# Patient Record
Sex: Female | Born: 1935 | Race: Black or African American | Hispanic: No | Smoking: Current every day smoker
Health system: Southern US, Community
[De-identification: ages and names within clinical notes are randomized; demographics above are authoritative.]

## PROBLEM LIST (undated history)

## (undated) DIAGNOSIS — L089 Local infection of the skin and subcutaneous tissue, unspecified: Secondary | ICD-10-CM

## (undated) DIAGNOSIS — J984 Other disorders of lung: Secondary | ICD-10-CM

## (undated) DIAGNOSIS — E7800 Pure hypercholesterolemia, unspecified: Secondary | ICD-10-CM

## (undated) DIAGNOSIS — I5022 Chronic systolic (congestive) heart failure: Secondary | ICD-10-CM

## (undated) DIAGNOSIS — I519 Heart disease, unspecified: Secondary | ICD-10-CM

## (undated) DIAGNOSIS — J449 Chronic obstructive pulmonary disease, unspecified: Secondary | ICD-10-CM

## (undated) DIAGNOSIS — I639 Cerebral infarction, unspecified: Secondary | ICD-10-CM

## (undated) DIAGNOSIS — R7611 Nonspecific reaction to tuberculin skin test without active tuberculosis: Secondary | ICD-10-CM

## (undated) DIAGNOSIS — F0390 Unspecified dementia without behavioral disturbance: Secondary | ICD-10-CM

## (undated) DIAGNOSIS — B029 Zoster without complications: Secondary | ICD-10-CM

## (undated) DIAGNOSIS — L723 Sebaceous cyst: Secondary | ICD-10-CM

## (undated) DIAGNOSIS — I2510 Atherosclerotic heart disease of native coronary artery without angina pectoris: Secondary | ICD-10-CM

---

## 2008-03-19 ENCOUNTER — Encounter: Payer: Self-pay | Admitting: Internal Medicine

## 2008-03-19 ENCOUNTER — Encounter: Admission: RE | Admit: 2008-03-19 | Discharge: 2008-03-19 | Payer: Self-pay | Admitting: Cardiology

## 2008-03-23 ENCOUNTER — Ambulatory Visit (HOSPITAL_COMMUNITY): Admission: RE | Admit: 2008-03-23 | Discharge: 2008-03-23 | Payer: Self-pay | Admitting: Cardiology

## 2008-03-23 ENCOUNTER — Encounter: Payer: Self-pay | Admitting: Internal Medicine

## 2008-03-30 ENCOUNTER — Ambulatory Visit: Payer: Self-pay | Admitting: Internal Medicine

## 2008-03-30 DIAGNOSIS — J449 Chronic obstructive pulmonary disease, unspecified: Secondary | ICD-10-CM

## 2008-03-30 DIAGNOSIS — I499 Cardiac arrhythmia, unspecified: Secondary | ICD-10-CM | POA: Insufficient documentation

## 2008-03-30 DIAGNOSIS — Z8659 Personal history of other mental and behavioral disorders: Secondary | ICD-10-CM | POA: Insufficient documentation

## 2008-03-30 DIAGNOSIS — J984 Other disorders of lung: Secondary | ICD-10-CM

## 2008-03-30 DIAGNOSIS — I219 Acute myocardial infarction, unspecified: Secondary | ICD-10-CM | POA: Insufficient documentation

## 2008-03-30 DIAGNOSIS — J4489 Other specified chronic obstructive pulmonary disease: Secondary | ICD-10-CM | POA: Insufficient documentation

## 2008-03-30 HISTORY — DX: Other disorders of lung: J98.4

## 2008-03-30 HISTORY — DX: Chronic obstructive pulmonary disease, unspecified: J44.9

## 2008-04-04 ENCOUNTER — Telehealth: Payer: Self-pay | Admitting: Internal Medicine

## 2008-04-10 ENCOUNTER — Ambulatory Visit (HOSPITAL_COMMUNITY): Admission: RE | Admit: 2008-04-10 | Discharge: 2008-04-10 | Payer: Self-pay | Admitting: Internal Medicine

## 2008-04-20 DIAGNOSIS — I6789 Other cerebrovascular disease: Secondary | ICD-10-CM

## 2008-04-20 DIAGNOSIS — I2510 Atherosclerotic heart disease of native coronary artery without angina pectoris: Secondary | ICD-10-CM | POA: Insufficient documentation

## 2008-04-20 DIAGNOSIS — E785 Hyperlipidemia, unspecified: Secondary | ICD-10-CM | POA: Insufficient documentation

## 2008-08-11 ENCOUNTER — Emergency Department (HOSPITAL_COMMUNITY): Admission: EM | Admit: 2008-08-11 | Discharge: 2008-08-11 | Payer: Self-pay | Admitting: Emergency Medicine

## 2009-02-25 ENCOUNTER — Ambulatory Visit: Payer: Self-pay | Admitting: Internal Medicine

## 2009-02-25 ENCOUNTER — Telehealth (INDEPENDENT_AMBULATORY_CARE_PROVIDER_SITE_OTHER): Payer: Self-pay | Admitting: *Deleted

## 2009-02-26 ENCOUNTER — Telehealth (INDEPENDENT_AMBULATORY_CARE_PROVIDER_SITE_OTHER): Payer: Self-pay | Admitting: *Deleted

## 2009-03-14 ENCOUNTER — Ambulatory Visit: Payer: Self-pay | Admitting: Internal Medicine

## 2009-06-14 ENCOUNTER — Telehealth: Payer: Self-pay | Admitting: Internal Medicine

## 2009-06-27 ENCOUNTER — Ambulatory Visit: Payer: Self-pay | Admitting: Internal Medicine

## 2009-06-27 DIAGNOSIS — N63 Unspecified lump in unspecified breast: Secondary | ICD-10-CM | POA: Insufficient documentation

## 2009-07-03 DIAGNOSIS — Z720 Tobacco use: Secondary | ICD-10-CM

## 2009-07-10 ENCOUNTER — Ambulatory Visit (HOSPITAL_COMMUNITY): Admission: RE | Admit: 2009-07-10 | Discharge: 2009-07-10 | Payer: Self-pay | Admitting: Internal Medicine

## 2009-07-10 ENCOUNTER — Telehealth (INDEPENDENT_AMBULATORY_CARE_PROVIDER_SITE_OTHER): Payer: Self-pay | Admitting: *Deleted

## 2009-07-16 ENCOUNTER — Ambulatory Visit: Payer: Self-pay | Admitting: Internal Medicine

## 2009-10-11 ENCOUNTER — Telehealth: Payer: Self-pay | Admitting: Internal Medicine

## 2009-11-29 ENCOUNTER — Telehealth (INDEPENDENT_AMBULATORY_CARE_PROVIDER_SITE_OTHER): Payer: Self-pay | Admitting: *Deleted

## 2009-12-23 ENCOUNTER — Ambulatory Visit: Payer: Self-pay | Admitting: Internal Medicine

## 2010-01-26 ENCOUNTER — Encounter: Payer: Self-pay | Admitting: Internal Medicine

## 2010-02-04 NOTE — Progress Notes (Signed)
Summary: nos appt  Phone Note Call from Patient   Caller: [email or web address]  Call For: young Summary of Call: [facility] nos from 6/9 to 6/23 @ 2:45p. Initial call taken by: Darletta Moll,  June 14, 2009 3:00 PM

## 2010-02-04 NOTE — Assessment & Plan Note (Signed)
Summary: rov/jd   Copy to:  Harwani Primary Provider/Referring Provider:  Sharyn Lull  CC:  Follow up visit-breathing good.Marland Kitchen  History of Present Illness: February 25, 2009--Presents for an acute office visit. Complains of cough occ producing white mucus that sounds "rattling", wheezing x67month. She is accompanied by caregiver --home health nurse that comes 5 days a week for 3 hrs daily. She says pt cough started 1 month ago and worsened for 1 week, now with thick muucs and wheezing.  She was seen last 03/2008, w/ PET scan that was positive for RUL nodule. She was recommended for follow up , unfortunately did not follow up . letter was sent to pt/family. Caregiver says she and Daughter are aware of this but unclear why pt did not follow up. Using cold meds without improvement w/ cough. NO increased confusion . No decline in oral intake. Denies chest pain, dyspnea, orthopnea, hemoptysis, fever, n/v/d, edema, headache.   March 14, 2009- Bronchitis, COPD, Lung nodule.....................daughter here. Finished prednisone and augmentin and feels back to normal now, We compared CXR from 02/25/09 with PET from 04/2008. There has been some slight progression of a hypermetabolic spiculated Right lung nodule. She and her daughter indicate clear understanding. We discussed options including needle biopsy or observation. The say clearly that they don't want anyting but observation. They do want to return for recheck in 3 moths.   June 27, 2009- Bronchitis, COPD, Lung nodule/ PET pos....................daughter here. Denies change in cough, phlegm, any blood, weight loss or pain. She doesn't want surgery,including the cataract surgery advised by her eye doctor. With daughter here, i was asked to examine chest wall- rib ends,  but also a ropey area 300 at left areola, no retraction or discharge. Daughter implies that mother not fuly able to make decisons and that daughter is HCPOA.  Preventive Screening-Counseling &  Management  Alcohol-Tobacco     Smoking Status: current-off and on.     Packs/Day: <0.25     Tobacco Counseling: to quit use of tobacco products  Current Medications (verified): 1)  Lipitor 10 Mg Tabs (Atorvastatin Calcium) .... Take 1 By Mouth Once Daily 2)  Zoloft 25 Mg Tabs (Sertraline Hcl) .... Take 1 By Mouth Once Daily 3)  Amlodipine Besylate 5 Mg Tabs (Amlodipine Besylate) .... Take 1 By Mouth Once Daily 4)  Metoprolol Succinate 25 Mg Xr24h-Tab (Metoprolol Succinate) .... Take 1 By Mouth Once Daily 5)  Namenda 5 Mg Tabs (Memantine Hcl) .... Take 1 By Mouth Once Daily 6)  Eldertonic  Elix (Multiple Vitamins-Minerals) .Marland Kitchen.. 1 Tablespoon Before Supper  Allergies (verified): No Known Drug Allergies  Past History:  Past Medical History: Last updated: 02/25/2009 C V A / Stroke Coronary Heart Disease Hyperlipidemia Myocardial Infarction psychiatric dx- ? schizophrenia- hosp 2010 'breakdown" Hx positive TB skin test- untreated  RUL nodule (CT chest)- positive PET 03/2008 - unable to reach, did not follow up, letter sent 2010.  --return ov- CXR February 25, 2009, showed slight increase in nodule.   Past Surgical History: Last updated: 03/30/2008 None  Family History: Last updated: 06/27/2009 Daughter and daughter in law both RX'd for TB  Social History: Last updated: 03/30/2008 Smoked for more than 30 years; quit in 2000; smoked 1 pack every 2 days.  widowed with children.  ives with granddaughter  Risk Factors: Smoking Status: current-off and on. (06/27/2009) Packs/Day: <0.25 (06/27/2009)  Family History: Daughter and daughter in Social worker both RX'd for TB  Social History: Smoking Status:  current-off and on. Packs/Day:  <0.25  Review of Systems      See HPI       The patient complains of shortness of breath with activity and non-productive cough.  The patient denies shortness of breath at rest, productive cough, coughing up blood, chest pain, irregular heartbeats,  acid heartburn, indigestion, loss of appetite, weight change, abdominal pain, difficulty swallowing, sore throat, tooth/dental problems, headaches, nasal congestion/difficulty breathing through nose, and sneezing.    Vital Signs:  Patient profile:   77 year old female Height:      62 inches Weight:      105 pounds BMI:     19.27 O2 Sat:      93 % on Room air Pulse rate:   75 / minute BP sitting:   124 / 78  (left arm) Cuff size:   regular  Vitals Entered By: Reynaldo Minium CMA (June 27, 2009 3:30 PM)  O2 Flow:  Room air CC: Follow up visit-breathing good.   Physical Exam  Additional Exam:  General: A/Ox person, ; pleasant and cooperative, NAD,calm, thin elderly woman SKIN: no rash, lesions NODES: no lymphadenopathy HEENT: [place]/AT, EOM- WNL, Conjuctivae- clear, PERRLA, TM-WNL, Nose- clear, Throat- clear and wnl, Mallampati II, some missing teeth NECK: Supple w/ fair ROM, JVD- none, normal carotid impulses w/o bruits Thyroid- normal to palpation CHEST;Few crackles, unlabored, ropey area lft breast lateral to areola at 3:00 HEART: RRR, no m/g/r heard ABDOMEN: Soft and nl;  JXB:JYNW, nl pulses, no edema  NEURO: no focal deficits noted.       Impression & Recommendations:  Problem # 1:  LUNG NODULE (ICD-518.89)  Daughter, HCPOA, insists that they want and will keep appointment for needle bx of lung nodule. Mother has alzheimer's. I discussed conservative management at age 34 and would anticipate early involvement of Hospice as an option.  Problem # 2:  BREAST MASS (ICD-611.72)  Question left breast mass. Agrees to [facility]  Problem # 3:  COPD (ICD-496) She is not active enough to really challenge her lung reserve. Does not seem to need bronchodilators.  Problem # 4:  TOBACCO USER (ICD-305.1) Although she doesn't smoke much, I did explain effects on her body, esp with hx cardiac disease. I pushed for her to stop and observed family should not buy her cigarettes.  Other  Orders: Est. Patient Level IV (29562) Radiology Referral (Radiology) Misc. Referral (Misc. Ref)  Patient Instructions: 1)  Please schedule a follow-up appointment in 1 month. 2)  See [facility] for referral for mamogram and for lung biopsy

## 2010-02-04 NOTE — Progress Notes (Signed)
Summary: rx  Phone Note Call from Patient   Caller: Patient Call For: Annette Cantrell Summary of Call: prescriptions from today's visit sent to wrong pharmacy.  Please c/x those and re-send to [facility] - [phone] Initial call taken by: Eugene Gavia,  February 25, 2009 2:03 PM  Follow-up for Phone Call        sorry, i added her new pharmacy today but EMR still defaults to the previous one.  will resend per TP. Boone Master CNA  February 25, 2009 2:26 PM   ATC to inform rx's resent to [facility] but line has been disconnected.  **NOTE home number was verified at [facility] today** will sign off on triage msg. Boone Master CNA  February 25, 2009 2:30 PM     Prescriptions: PREDNISONE 10 MG TABS (PREDNISONE) 4 tabs for 2 days, then 3 tabs for 2 days, 2 tabs for 2 days, then 1 tab for 2 days, then stop  #20 x 0   Entered by:   Boone Master CNA   Authorized by:   Rubye Oaks NP   Signed by:   Boone Master CNA on 02/25/2009   Method used:   Electronically to        [facility] [id number] (retail)       [address] [place], [place]  [address]       Ph: [phone]       Fax: [phone]   RxID:   [id number] AUGMENTIN 875-125 MG TABS (AMOXICILLIN-POT CLAVULANATE) 1 by mouth two times a day  #14 x 0   Entered by:   Boone Master CNA   Authorized by:   Rubye Oaks NP   Signed by:   Boone Master CNA on 02/25/2009   Method used:   Electronically to        [facility] [id number] (retail)       [address] [place], [place]  [address]       Ph: [phone]       Fax: [phone]   RxID:   [id number]

## 2010-02-04 NOTE — Progress Notes (Signed)
Summary: Patient declined needle biopsy  Phone Note From Other Clinic   Summary of Call: Radiology called. Patient there with daughter. Patient is now refusing any biopsy or needle procedure or surgery. So noted.  I will ask Florentina Addison to make her a f/u appointment to review. Initial call taken by: Waymon Budge MD,  July 10, 2009 10:52 AM  Follow-up for Phone Call        Attempted to call pt; line busy x 3. Reynaldo Minium CMA  July 10, 2009 5:04 PM    Spoke with pt's daughter; aware of July 12th 1115am appt with CD[facility] to review next steps for treatment.Reynaldo Minium CMA  July 12, 2009 3:12 PM

## 2010-02-04 NOTE — Progress Notes (Signed)
Summary: nos appt  Phone Note Call from Patient   Caller: [email or web address]  Call For: young Summary of Call: In ref to 10/6 nos, pt's daughter states she will call to rsc. Initial call taken by: Darletta Moll,  October 11, 2009 9:28 AM

## 2010-02-04 NOTE — Progress Notes (Signed)
Summary: needs [facility]s [facility]  Phone Note Call from Patient Call back at Home Phone [phone]   Caller: Britta Mccreedy (daughter) Call For: young Summary of Call: Britta Mccreedy the daughter was calling beause the medicine that was prescribed yesterday, was sent to the wrong pharmacy. then when she went to [facility] to pick the meds up, they told her that it could not be filled because it was to close together.  Initial call taken by: Valinda Hoar,  February 26, 2009 12:53 PM  Follow-up for Phone Call        i had Dot Lanes transfer the call to me.  Britta Mccreedy stated that she went to the [facility] and was told that "it was too soon for refills" and that her mother needs her medication.  i informed Britta Mccreedy that her mother's meds were indeed sent to [facility] yesterday afternoon but we were unable to contact them regarding this b/c the phone was incorrect.  i then asked her to give a correct contact number.  when i read off the one that i perosnally verified at the [facility], barbra confirmed that i was given the wrong number.  i told Britta Mccreedy that will call [facility] to find out why they would not fill pt's meds.  called spoke with pharmacist at [facility] and was told that the pred taper and augmentin were denied by the insurance b/c it had been sent to another pharmacy - which happened when EMR defaulted to pt's previous pharmacy.  i then called [facility] on [address] to make sure that the rx was ready for pick up, which was verified.  called spoke to [place] again, updated her on what i found out about the rx's.  told her that pharmacy that the scripts were at and she stated that she will go there to pick them up this afternoon.  i apologized for the inconveniences and misunderstandings and told her that this will not happen again.  Britta Mccreedy was happy with this outcome and verbalized her understanding. Follow-up by: Boone Master CNA,  February 26, 2009 2:32 PM

## 2010-02-04 NOTE — Assessment & Plan Note (Signed)
Summary: follow up/klw   Copy to:  Harwani Primary Provider/Referring Provider:  Sharyn Cantrell  CC:  Follow up visit.  History of Present Illness:  03/30/08-New consult for Dr Annette Cantrell- lung nodules.CXR 03/19/08- densities in RUL and AP window. CT w/o cm 3/19- 1.6 mm spiculated nodule RUL- high suspicion, soft tissue fulness R neck- Mass vs vascular. Other densities in RUL, lingula with bronchiectasis.She comes with daughter and granddaughter. They had just moved her from [facility] 01/15/08, with report of psychiatric inpatient care in 2010. She describes "heavy soreness" that comes and goes over the past month., lasting 1-2 hours, worse if using arms. Some dyspnea, mild dry cough without wheeze. Never told COPD or pneumonia. Denies routine cough. Occasional night sweats. Hx POS PPD, never treated. A daughter was Rx'd for TB.  February 25, 2009--Presents for an acute office visit. Complains of cough occ producing white mucus that sounds "rattling", wheezing x23month. She is accompanied by caregiver --home health nurse that comes 5 days a week for 3 hrs daily. She says pt cough started 1 month ago and worsened for 1 week, now with thick muucs and wheezing.  She was seen last 03/2008, w/ PET scan that was positive for RUL nodule. She was recommended for follow up , unfortunately did not follow up . letter was sent to pt/family. Caregiver says she and Daughter are aware of this but unclear why pt did not follow up. Using cold meds without improvement w/ cough. NO increased confusion . No decline in oral intake. Denies chest pain, dyspnea, orthopnea, hemoptysis, fever, n/v/d, edema, headache.   March 14, 2009- Bronchitis, COPD, Lung nodule.....................daughter here. Finished prednisone and augmentin and feels back to normal now, We compared CXR from 02/25/09 with PET from 04/2008. There has been some slight progression of a hypermetabolic spiculated Right lung nodule. She and her daughter indicate clear  understanding. We discussed options including needle biopsy or observation. The say clearly that they don't want anyting but observation. They do want to return for recheck in 3 moths.    Current Medications (verified): 1)  Lipitor 10 Mg Tabs (Atorvastatin Calcium) .... Take 1 By Mouth Once Daily 2)  Zoloft 25 Mg Tabs (Sertraline Hcl) .... Take 1 By Mouth Once Daily 3)  Amlodipine Besylate 5 Mg Tabs (Amlodipine Besylate) .... Take 1 By Mouth Once Daily 4)  Metoprolol Succinate 25 Mg Xr24h-Tab (Metoprolol Succinate) .... Take 1 By Mouth Once Daily 5)  Namenda 5 Mg Tabs (Memantine Hcl) .... Take 1 By Mouth Once Daily  Allergies (verified): No Known Drug Allergies  Past History:  Past Medical History: Last updated: 02/25/2009 C V A / Stroke Coronary Heart Disease Hyperlipidemia Myocardial Infarction psychiatric dx- ? schizophrenia- hosp 2010 'breakdown" Hx positive TB skin test- untreated  RUL nodule (CT chest)- positive PET 03/2008 - unable to reach, did not follow up, letter sent 2010.  --return ov- CXR February 25, 2009, showed slight increase in nodule.   Past Surgical History: Last updated: 03/30/2008 None  Family History: Last updated: 03/30/2008 Daughter and daughter in law goth RX'd for TB  Social History: Last updated: 03/30/2008 Smoked for more than 30 years; quit in 2000; smoked 1 pack every 2 days.  widowed with children.  ives with granddaughter  Risk Factors: Smoking Status: quit (02/25/2009)  Review of Systems      See HPI       The patient complains of dyspnea on exertion.  The patient denies anorexia, fever, weight loss, weight gain, vision loss, decreased hearing,  hoarseness, chest pain, syncope, peripheral edema, prolonged cough, headaches, hemoptysis, abdominal pain, and severe indigestion/heartburn.    Vital Signs:  Patient profile:   77 year old female Height:      62 inches Weight:      111.25 pounds O2 Sat:      94 % on Room air Pulse rate:    88 / minute BP sitting:   142 / 64  (left arm) Cuff size:   regular  Vitals Entered By: Reynaldo Minium CMA (March 14, 2009 3:24 PM)  O2 Flow:  Room air  Physical Exam  Additional Exam:  General: A/Ox person, ; pleasant and cooperative, NAD,calm, thin elderly woman SKIN: no rash, lesions NODES: no lymphadenopathy HEENT: [place]/AT, EOM- WNL, Conjuctivae- clear, PERRLA, TM-WNL, Nose- clear, Throat- clear and wnl, Mallampati II, some missing teeth NECK: Supple w/ fair ROM, JVD- none, normal carotid impulses w/o bruits Thyroid- normal to palpation CHEST;Few crackles, unlabored HEART: RRR, no m/g/r heard ABDOMEN: Soft and nl;  HQI:ONGE, nl pulses, no edema  NEURO: no focal deficits noted.       Impression & Recommendations:  Problem # 1:  LUNG NODULE (ICD-518.89)  Tis is consistent with a slowly growing lung cancer in the right lung- perhaps BAC. She doesn't want anything but observation, I will let them try eldetrtonic for appetite as they ask.  Orders: Est. Patient Level III (95284)  Problem # 2:  COPD (ICD-496) Acute bronchitis has resolved.  Medications Added to Medication List This Visit: 1)  Eldertonic Elix (Multiple vitamins-minerals) .Marland Kitchen.. 1 tablespoon before supper  Patient Instructions: 1)  Please schedule a follow-up appointment in 3 months. 2)  Try Eldertonic- script- as an apetite booster. Prescriptions: ELDERTONIC  ELIX (MULTIPLE VITAMINS-MINERALS) 1 tablespoon before supper  #1 bottle x prn   Entered and Authorized by:   Waymon Budge MD   Signed by:   Waymon Budge MD on 03/14/2009   Method used:   Print then Give to Patient   RxID:   [id number]

## 2010-02-04 NOTE — Assessment & Plan Note (Signed)
Summary: Acute NP office visit - wheezing   Copy to:  Harwani Primary Provider/Referring Provider:  Sharyn Lull  CC:  cough occ producing white mucus that sounds "rattling", wheezing x92month - denies dyspnea, and f/c/s.  History of Present Illness: 77 yo female with initial evaluation 03/30/08 for lung nodules. She has hx of HTN, hyperlipidemia, and Dementia, CAD.   03/30/08-New consult for Dr Sharyn Lull- lung nodules.CXR 03/19/08- densities in RUL and AP window. CT w/o cm 3/19- 1.6 mm spiculated nodule RUL- high suspicion, soft tissue fulness R neck- Mass vs vascular. Other densities in RUL, lingula with bronchiectasis.She comes with daughter and granddaughter. They had just moved her from [facility] 01/15/08, with report of psychiatric inpatient care in 2010. She describes "heavy soreness" that comes and goes over the past month., lasting 1-2 hours, worse if using arms. Some dyspnea, mild dry cough without wheeze. Never told COPD or pneumonia. Denies routine cough. Occasional night sweats. Hx POS PPD, never treated. A daughter was Rx'd for TB.  February 25, 2009--Presents for an acute office visit. Complains of cough occ producing white mucus that sounds "rattling", wheezing x64month. She is accompanied by caregiver --home health nurse that comes 5 days a week for 3 hrs daily. She says pt cough started 1 month ago and worsened for 1 week, now with thick muucs and wheezing.  She was seen last 03/2008, w/ PET scan that was positive for RUL nodule. She was recommended for follow up , unfortunately did not follow up . letter was sent to pt/family. Caregiver says she and Daughter are aware of this but unclear why pt did not follow up. Using cold meds without improvement w/ cough. NO increased confusion . No decline in oral intake. Denies chest pain, dyspnea, orthopnea, hemoptysis, fever, n/v/d, edema, headache.   Preventive Screening-Counseling & Management  Alcohol-Tobacco     Smoking Status: quit  Medications Prior  to Update: 1)  Lipitor 10 Mg Tabs (Atorvastatin Calcium) .... Take 1 By Mouth Once Daily 2)  Zoloft 25 Mg Tabs (Sertraline Hcl) .... Take 1 By Mouth Once Daily 3)  Amlodipine Besylate 5 Mg Tabs (Amlodipine Besylate) .... Take 1 By Mouth Once Daily 4)  Metoprolol Succinate 25 Mg Xr24h-Tab (Metoprolol Succinate) .... Take 1 By Mouth Once Daily 5)  Namenda 5 Mg Tabs (Memantine Hcl) .... Take 1 By Mouth Once Daily  Current Medications (verified): 1)  Lipitor 10 Mg Tabs (Atorvastatin Calcium) .... Take 1 By Mouth Once Daily 2)  Zoloft 25 Mg Tabs (Sertraline Hcl) .... Take 1 By Mouth Once Daily 3)  Amlodipine Besylate 5 Mg Tabs (Amlodipine Besylate) .... Take 1 By Mouth Once Daily 4)  Metoprolol Succinate 25 Mg Xr24h-Tab (Metoprolol Succinate) .... Take 1 By Mouth Once Daily 5)  Namenda 5 Mg Tabs (Memantine Hcl) .... Take 1 By Mouth Once Daily  Allergies (verified): No Known Drug Allergies  Past History:  Past Surgical History: Last updated: 03/30/2008 None  Family History: Last updated: 03/30/2008 Daughter and daughter in law goth RX'd for TB  Social History: Last updated: 03/30/2008 Smoked for more than 30 years; quit in 2000; smoked 1 pack every 2 days.  widowed with children.  ives with granddaughter  Risk Factors: Smoking Status: quit (02/25/2009)  Past Medical History: C V A / Stroke Coronary Heart Disease Hyperlipidemia Myocardial Infarction psychiatric dx- ? schizophrenia- hosp 2010 'breakdown" Hx positive TB skin test- untreated  RUL nodule (CT chest)- positive PET 03/2008 - unable to reach, did not follow up, letter sent 2010.  --  return ov- CXR February 25, 2009, showed slight increase in nodule.   Social History: Smoking Status:  quit  Review of Systems      See HPI  Vital Signs:  Patient profile:   77 year old female Height:      62 inches Weight:      107.50 pounds O2 Sat:      95 % on Room air Temp:     98.5 degrees F oral Pulse rate:   74 /  minute BP sitting:   112 / 68  (left arm) Cuff size:   regular  Vitals Entered By: Boone Master CNA (February 25, 2009 11:37 AM)  O2 Flow:  Room air CC: cough occ producing white mucus that sounds "rattling", wheezing x73month - denies dyspnea, f/c/s Is Patient Diabetic? No Comments Medications reviewed with patient Daytime contact number verified with patient. Boone Master CNA  February 25, 2009 11:36 AM    Physical Exam  Additional Exam:  General: A/Ox person, ; pleasant and cooperative, NAD,calm, thin SKIN: no rash, lesions NODES: no lymphadenopathy HEENT: [place]/AT, EOM- WNL, Conjuctivae- clear, PERRLA, TM-WNL, Nose- clear, Throat- clear and wnl, Melampatti II, some missing teeth NECK: Supple w/ fair ROM, JVD- none, normal carotid impulses w/o bruits Thyroid- normal to palpation CHEST; Faint crackles right chest cleared with deep breaths exp wheeze.   HEART: RRR, no m/g/r heard ABDOMEN: Soft and nl; nml bowel sounds; no organomegaly or masses noted, scaphoid WNU:UVOZ, nl pulses, no edema  NEURO: no focal deficits noted.       Impression & Recommendations:  Problem # 1:  LUNG NODULE (ICD-518.89)  RUL nodule w/ positive PET 03/2008, will need follow up CXR today Caregiver aware for follow up in 1 week , I have asked her to let pt daughter-Barbara  to come to next office visit. Their phone is disconnected. She verbalizes understanding and assures me she will be w/ pt and will try to get family to come next week w/ ov w/ Dr. [name]  CXR w/ sl increase in size of nodule.  Case discussed with Dr. [name] .   Orders: Nebulizer Tx (36644) T-2 View CXR (71020TC) Est. Patient Level IV (03474)  Problem # 2:  COPD (ICD-496) Exacerbation  REC: Xopenex neb today in office.  Augmentin 875mg  two times a day for 7 days w/ food.  Mucinex DM two times a day as needed cough/congestion  Prednisone taper over next week I will calll with xray results.  follow up 2 weeks Dr. [name]  Please  contact office for sooner follow up if symptoms do not improve or worsen   Medications Added to Medication List This Visit: 1)  Augmentin 875-125 Mg Tabs (Amoxicillin-pot clavulanate) .Marland Kitchen.. 1 by mouth two times a day 2)  Prednisone 10 Mg Tabs (Prednisone) .... 4 tabs for 2 days, then 3 tabs for 2 days, 2 tabs for 2 days, then 1 tab for 2 days, then stop  Complete Medication List: 1)  Lipitor 10 Mg Tabs (Atorvastatin calcium) .... Take 1 by mouth once daily 2)  Zoloft 25 Mg Tabs (Sertraline hcl) .... Take 1 by mouth once daily 3)  Amlodipine Besylate 5 Mg Tabs (Amlodipine besylate) .... Take 1 by mouth once daily 4)  Metoprolol Succinate 25 Mg Xr24h-tab (Metoprolol succinate) .... Take 1 by mouth once daily 5)  Namenda 5 Mg Tabs (Memantine hcl) .... Take 1 by mouth once daily 6)  Augmentin 875-125 Mg Tabs (Amoxicillin-pot clavulanate) .Marland Kitchen.. 1 by mouth two times  a day 7)  Prednisone 10 Mg Tabs (Prednisone) .... 4 tabs for 2 days, then 3 tabs for 2 days, 2 tabs for 2 days, then 1 tab for 2 days, then stop  Patient Instructions: 1)  Augmentin 875mg  two times a day for 7 days w/ food.  2)  Mucinex DM two times a day as needed cough/congestion  3)  Prednisone taper over next week 4)  I will calll with xray results.  5)  follow up 2 weeks Dr. [name]  6)  Please contact office for sooner follow up if symptoms do not improve or worsen  Prescriptions: PREDNISONE 10 MG TABS (PREDNISONE) 4 tabs for 2 days, then 3 tabs for 2 days, 2 tabs for 2 days, then 1 tab for 2 days, then stop  #20 x 0   Entered and Authorized by:   Rubye Oaks NP   Signed by:   Tammy Parrett NP on 02/25/2009   Method used:   Electronically to        [facility]. [address] [phone]* (retail)       [address]       [place], [place]  [address]       Ph: [phone] or [phone]       Fax: [phone]   RxID:   [id number] AUGMENTIN 875-125 MG TABS (AMOXICILLIN-POT CLAVULANATE) 1 by mouth two times a  day  #14 x 0   Entered and Authorized by:   Rubye Oaks NP   Signed by:   Tammy Parrett NP on 02/25/2009   Method used:   Electronically to        [facility]. [address] [phone]* (retail)       [address]       [place], [place]  [address]       Ph: [phone] or [phone]       Fax: [phone]   RxID:   [id number]

## 2010-02-04 NOTE — Assessment & Plan Note (Signed)
Summary: follow up visit per CDY-refused needle biopsy/kcw   Copy to:  Harwani Primary Provider/Referring Provider:  Sharyn Lull  CC:  1 month follow up and pt refused to have mammogram or lung biopsy.  History of Present Illness:  March 14, 2009- Bronchitis, COPD, Lung nodule.....................daughter here. Finished prednisone and augmentin and feels back to normal now, We compared CXR from 02/25/09 with PET from 04/2008. There has been some slight progression of a hypermetabolic spiculated Right lung nodule. She and her daughter indicate clear understanding. We discussed options including needle biopsy or observation. The say clearly that they don't want anyting but observation. They do want to return for recheck in 3 moths.   June 27, 2009- Bronchitis, COPD, Lung nodule/ PET pos....................daughter here. Denies change in cough, phlegm, any blood, weight loss or pain. She doesn't want surgery,including the cataract surgery advised by her eye doctor. With daughter here, i was asked to examine chest wall- rib ends,  but also a ropey area 300 at left areola, no retraction or discharge. Daughter implies that mother not fuly able to make decisons and that daughter is HCPOA.  July 16, 2009- Bronchitis, COPD, Lung nodule/ PET pos...................daughter here We had scheduled a needle biopsy of the right  lung lesion after discussion here, when daughter insisted that mother would cooperate to have it done.  Patient now insists that she is comfortable and in good shape and she refuses to have the lung lesion biopsied, after her daughter took her there. Mother is elderly, but responsive to questions and able to speak her own mind, so I explained to daughter that we were not going to be forcing anything.    Preventive Screening-Counseling & Management  Alcohol-Tobacco     Smoking Status: quit > 6 months  Current Medications (verified): 1)  Lipitor 10 Mg Tabs (Atorvastatin Calcium) ....  Take 1 By Mouth Once Daily 2)  Zoloft 25 Mg Tabs (Sertraline Hcl) .... Take 1 By Mouth Once Daily 3)  Amlodipine Besylate 5 Mg Tabs (Amlodipine Besylate) .... Take 1 By Mouth Once Daily 4)  Metoprolol Succinate 25 Mg Xr24h-Tab (Metoprolol Succinate) .... Take 1 By Mouth Once Daily 5)  Namenda 5 Mg Tabs (Memantine Hcl) .... Take 1 By Mouth Once Daily 6)  Eldertonic  Elix (Multiple Vitamins-Minerals) .Marland Kitchen.. 1 Tablespoon Before Supper  Allergies (verified): No Known Drug Allergies  Past History:  Past Medical History: Last updated: 02/25/2009 C V A / Stroke Coronary Heart Disease Hyperlipidemia Myocardial Infarction psychiatric dx- ? schizophrenia- hosp 2010 'breakdown" Hx positive TB skin test- untreated  RUL nodule (CT chest)- positive PET 03/2008 - unable to reach, did not follow up, letter sent 2010.  --return ov- CXR February 25, 2009, showed slight increase in nodule.   Past Surgical History: Last updated: 03/30/2008 None  Family History: Last updated: 06/27/2009 Daughter and daughter in law both RX'd for TB  Social History: Last updated: 03/30/2008 Smoked for more than 30 years; quit in 2000; smoked 1 pack every 2 days.  widowed with children.  ives with granddaughter  Risk Factors: Smoking Status: quit > 6 months (07/16/2009) Packs/Day: <0.25 (06/27/2009)  Social History: Smoking Status:  quit > 6 months  Review of Systems      See HPI  The patient denies shortness of breath with activity, shortness of breath at rest, productive cough, non-productive cough, coughing up blood, chest pain, irregular heartbeats, acid heartburn, indigestion, loss of appetite, weight change, abdominal pain, difficulty swallowing, sore throat, tooth/dental problems, headaches, nasal congestion/difficulty  breathing through nose, and sneezing.    Vital Signs:  Patient profile:   77 year old female Height:      62 inches Weight:      103 pounds BMI:     18.91 O2 Sat:      96 % on Room  air Pulse rate:   78 / minute BP sitting:   130 / 78  (left arm)  Vitals Entered By: Renold Genta RCP, LPN (July 16, 2009 11:21 AM)  O2 Sat at Rest %:  96% O2 Flow:  Room air CC: 1 month follow up, pt refused to have mammogram or lung biopsy Comments Medications reviewed with patient Renold Genta RCP, LPN  July 16, 2009 11:39 AM    Physical Exam  Additional Exam:  General: A/Ox person, ; pleasant and cooperative, NAD,calm, thin elderly woman SKIN: no rash, lesions NODES: no lymphadenopathy HEENT: [place]/AT, EOM- WNL, Conjuctivae- clear, PERRLA, TM-WNL, Nose- clear, Throat- clear and wnl, Mallampati II, some missing teeth NECK: Supple w/ fair ROM, JVD- none, normal carotid impulses w/o bruits Thyroid- normal to palpation CHEST; distant unlabored breathsounds, without crackles, wheeze or dullness today HEART: RRR, no m/g/r heard ABDOMEN: Soft and nl;  NUU:VOZD, nl pulses, no edema  NEURO: no focal deficits noted.       Impression & Recommendations:  Problem # 1:  LUNG NODULE (ICD-518.89)  PET positive lesion. She refuses to have it biopsied and seems to understand the issues enough to make this decision for herself. We agreed to a repeat CXR in 3-4 months.  Problem # 2:  COPD (ICD-496) She is now off tobacco and having only  a little chonic bronchitic cough.  Problem # 3:  PSYCHIATRIC DISORDER, HX OF (ICD-V11.9) I am not trained to make a formal assessment of mental competence, but she seems interactive, consistent, and vocal for her own interests.   Other Orders: Est. Patient Level III (66440)  Patient Instructions: 1)  Please schedule a follow-up appointment in 3 months. We will do a CXR when you return. 2)  Call sooner if needed.

## 2010-02-04 NOTE — Progress Notes (Signed)
Summary: unable to come to appt today  Phone Note Outgoing Call Call back at [facility] Phone [phone]   Call placed by: Boone Master CNA/MA,  November 29, 2009 9:58 AM Call placed to: Annette Cantrell - daughter Summary of Call: automated system states office is closed.  per answering service, pt's daughter called to verify appt this morning.  called spoke with Annette Cantrell, and explained about the answering service.  she is unable to bring the Annette Cantrell in now d/t having to change transportation.  would like to change appt to one day next week, preferably Tues, Wed or Thurs.  please advise if pt may be worked in next week, thanks! Initial call taken by: Boone Master CNA/MA,  November 29, 2009 10:01 AM  Follow-up for Phone Call        Spoke with Annette Cantrell-will be here Tuesday 12-03-09 at 1115am for 1130am appt.Reynaldo Minium CMA  November 29, 2009 12:18 PM

## 2010-02-06 NOTE — Assessment & Plan Note (Signed)
Summary: Annette Cantrell   Visit Type:  Follow-up Copy to:  Harwani Primary Provider/Referring Provider:  Sharyn Lull  CC:  follow up. pt denies any cough. pt c.oi left side pain and being aggitated. pt states breathing is fine.  History of Present Illness: June 27, 2009- Bronchitis, COPD, Lung nodule/ PET pos....................daughter here. Denies change in cough, phlegm, any blood, weight loss or pain. She doesn't want surgery,including the cataract surgery advised by her eye doctor. With daughter here, i was asked to examine chest wall- rib ends,  but also a ropey area 300 at left areola, no retraction or discharge. Daughter implies that mother not fuly able to make decisons and that daughter is HCPOA.  July 16, 2009- Bronchitis, COPD, Lung nodule/ PET pos...................daughter here We had scheduled a needle biopsy of the right  lung lesion after discussion here, when daughter insisted that mother would cooperate to have it done.  Patient now insists that she is comfortable and in good shape and she refuses to have the lung lesion biopsied, after her daughter took her there. Mother is elderly, but responsive to questions and able to speak her own mind, so I explained to daughter that we were not going to be forcing anything.  December 23, 2009- Bronchitis, COPD, Lung nodule/ PET pos...................daughter here Nurse-CC: follow up. pt denies any cough. pt c.oi left side pain and being aggitated. pt states breathing is fine Patient had cancelled a needle bx of lung nodule.  Denies dyspnea or cough. Admits tenderness through ribcage, especially around left post axillary line. This has bothered her for a couple of months. Gets headaches. Still smokes 5 cigarettes/ day.     Preventive Screening-Counseling & Management  Alcohol-Tobacco     Smoking Status: current     Packs/Day: <0.25  Comments: 5 cigarettes daily  Current Medications (verified): 1)  Lipitor 10 Mg Tabs (Atorvastatin  Calcium) .... Take 1 By Mouth Once Daily 2)  Zoloft 25 Mg Tabs (Sertraline Hcl) .... Take 1 By Mouth Once Daily 3)  Amlodipine Besylate 5 Mg Tabs (Amlodipine Besylate) .... Take 1 By Mouth Once Daily 4)  Metoprolol Succinate 25 Mg Xr24h-Tab (Metoprolol Succinate) .... Take 1 By Mouth Once Daily 5)  Namenda 5 Mg Tabs (Memantine Hcl) .... Take 1 By Mouth Once Daily 6)  Eldertonic  Elix (Multiple Vitamins-Minerals) .Marland Kitchen.. 1 Tablespoon Before Supper  Allergies (verified): No Known Drug Allergies  Past History:  Past Medical History: Last updated: 02/25/2009 C V A / Stroke Coronary Heart Disease Hyperlipidemia Myocardial Infarction psychiatric dx- ? schizophrenia- hosp 2010 'breakdown" Hx positive TB skin test- untreated  RUL nodule (CT chest)- positive PET 03/2008 - unable to reach, did not follow up, letter sent 2010.  --return ov- CXR February 25, 2009, showed slight increase in nodule.   Past Surgical History: Last updated: 03/30/2008 None  Family History: Last updated: 06/27/2009 Daughter and daughter in law both RX'd for TB  Social History: Last updated: 12/23/2009 Smoked for more than 30 years; quit in 2000; smoked 1 pack every 2 days. pt currently smoking 5 cigs a day widowed with children.  ives with granddaughter  Risk Factors: Smoking Status: current (12/23/2009) Packs/Day: <0.25 (12/23/2009)  Social History: Smoked for more than 30 years; quit in 2000; smoked 1 pack every 2 days. pt currently smoking 5 cigs a day widowed with children.  ives with granddaughterSmoking Status:  current  Review of Systems      See HPI       The patient complains of  chest pain.  The patient denies shortness of breath with activity, shortness of breath at rest, productive cough, non-productive cough, coughing up blood, irregular heartbeats, acid heartburn, indigestion, loss of appetite, weight change, abdominal pain, difficulty swallowing, sore throat, tooth/dental problems,  headaches, nasal congestion/difficulty breathing through nose, and sneezing.    Vital Signs:  Patient profile:   77 year old female Height:      62 inches Weight:      100.25 pounds BMI:     18.40 O2 Sat:      96 % on Room air Pulse rate:   56 / minute BP sitting:   152 / 78  (left arm) Cuff size:   regular  Vitals Entered By: Carver Fila (December 23, 2009 9:50 AM)  O2 Flow:  Room air CC: follow up. pt denies any cough. pt c.oi left side pain and being aggitated. pt states breathing is fine Comments meds and allergies updated Phone number updated Carver Fila  December 23, 2009 9:50 AM    Physical Exam  Additional Exam:  General: A/Ox person, ; pleasant and cooperative, NAD,calm, thin elderly woman SKIN: no rash, lesions NODES: no lymphadenopathy HEENT: [place]/AT, EOM- WNL, Conjuctivae- clear, PERRLA, TM-WNL, Nose- clear, Throat- clear and wnl, Mallampati II, some missing teeth NECK: Supple w/ fair ROM, JVD- none, normal carotid impulses w/o bruits Thyroid- normal to palpation CHEST; distant unlabored breathsounds, without crackles, wheeze or dullness today. Loss of subcut fat makes ribs a little bonier, somehat tender to compression.  HEART: RRR, no m/g/r heard ABDOMEN: Soft and nl;  HQI:ONGE, nl pulses, no edema  NEURO: no focal deficits noted.       Impression & Recommendations:  Problem # 1:  LUNG NODULE (ICD-518.89)  We are following a mid-right lung nodule that she has refused to have biopsied. The sore ribs complaint seems new but not suddenly recent. We will get CXR.   Problem # 2:  Hx of MYOCARDIAL INFARCTION (ICD-410.90) Chest wall pain today- not anginal in characcter from her description. We conxidered other pleuritic pains but doubt infarction or pleurisy. Her updated medication list for this problem includes:    Amlodipine Besylate 5 Mg Tabs (Amlodipine besylate) .Marland Kitchen... Take 1 by mouth once daily    Metoprolol Succinate 25 Mg Xr24h-tab (Metoprolol succinate)  .Marland Kitchen... Take 1 by mouth once daily  Other Orders: Est. Patient Level IV (99214) T-2 View CXR (71020TC)  Patient Instructions: 1)  Please schedule a follow-up appointment in 4 months. 2)  A chest x-ray has been recommended.  Your imaging study may require preauthorization.  3)  Let people know if your rib pains get worse. 4)  Try otc meds like Advil or Motirn and maybe a heating pad.

## 2010-03-23 LAB — PROTIME-INR
INR: 0.91 (ref 0.00–1.49)
Prothrombin Time: 12.2 seconds (ref 11.6–15.2)

## 2010-03-23 LAB — CBC
MCH: 32.9 pg (ref 26.0–34.0)
MCHC: 34.1 g/dL (ref 30.0–36.0)
RDW: 13.1 % (ref 11.5–15.5)

## 2010-04-12 LAB — CBC
HCT: 36.6 % (ref 36.0–46.0)
Hemoglobin: 12.3 g/dL (ref 12.0–15.0)
MCHC: 33.6 g/dL (ref 30.0–36.0)
Platelets: 274 10*3/uL (ref 150–400)
RDW: 12.4 % (ref 11.5–15.5)

## 2010-04-12 LAB — BASIC METABOLIC PANEL
BUN: 18 mg/dL (ref 6–23)
CO2: 27 mEq/L (ref 19–32)
GFR calc non Af Amer: 39 mL/min — ABNORMAL LOW (ref >60)
Glucose, Bld: 107 mg/dL — ABNORMAL HIGH (ref 70–99)
Potassium: 4.2 mEq/L (ref 3.5–5.1)

## 2010-04-12 LAB — URINALYSIS, ROUTINE W REFLEX MICROSCOPIC
Bilirubin Urine: NEGATIVE
Nitrite: NEGATIVE
Specific Gravity, Urine: 1.022 (ref 1.005–1.030)
Urobilinogen, UA: 1 mg/dL (ref 0.0–1.0)
pH: 8 (ref 5.0–8.0)

## 2010-04-12 LAB — DIFFERENTIAL
Basophils Absolute: 0.1 10*3/uL (ref 0.0–0.1)
Basophils Relative: 1 % (ref 0–1)
Eosinophils Absolute: 0.5 10*3/uL (ref 0.0–0.7)
Eosinophils Relative: 8 % — ABNORMAL HIGH (ref 0–5)
Lymphocytes Relative: 36 % (ref 12–46)
Monocytes Absolute: 0.5 10*3/uL (ref 0.1–1.0)

## 2010-04-12 LAB — CULTURE, BLOOD (ROUTINE X 2)

## 2010-04-12 LAB — URINE MICROSCOPIC-ADD ON

## 2010-04-12 LAB — URINE CULTURE

## 2010-04-12 LAB — POCT CARDIAC MARKERS
CKMB, poc: <1 ng/mL — ABNORMAL LOW (ref 1.0–8.0)
Troponin i, poc: <0.05 ng/mL (ref 0.00–0.09)

## 2010-04-16 LAB — GLUCOSE, CAPILLARY: Glucose-Capillary: 112 mg/dL — ABNORMAL HIGH (ref 70–99)

## 2010-05-13 ENCOUNTER — Other Ambulatory Visit (HOSPITAL_COMMUNITY): Payer: Self-pay | Admitting: Cardiology

## 2010-05-23 NOTE — Letter (Signed)
January 02, 2009     RE:  Annette Cantrell, Annette Cantrell  MRN:  [id number]  /  DOB:  04-06-1922   Dear Ms. Dun:   I am writing to follow up because you had an abnormal chest x-ray  showing a lung nodule concerning for possible cancer.  You had been at  this office in the spring, and we had a PET scan.  We had contacted you  with the results, and we had also spoken with your granddaughter.  You  did not keep office appointments for scheduled followup on April 24, 2008, and May 01, 2008, and then canceled an appointment for May 11, 2008, without scheduling a return visit.  I see that you were also at  the emergency room because of shortness of breath on August 11, 2008, and  had a chest x-ray done.  That chest x-ray again showed the worrisome  spot in the upper part of your right lung, and they document that they  asked you to get this followed up.  We have not heard from you since.  You certainly may make your own decisions, but please recognize that we  are concerned for your welfare.  If you would like [place] to see you again  and check on the status of this is possible cancer, please call our  office for an appointment.  If you do not see [place] please see Dr. Sharyn Lull  or another physician of your choice.   Respectfully.    Sincerely,      [name]. [name], MD, Tonny Bollman, FACP  Electronically Signed    CDY/MedQ  DD: 01/02/2009  DT: 01/03/2009  Job #: [id number]   CC:    Eduardo Osier. Sharyn Lull, M.D.

## 2010-06-09 ENCOUNTER — Ambulatory Visit (HOSPITAL_COMMUNITY): Payer: Self-pay

## 2010-06-09 ENCOUNTER — Ambulatory Visit (HOSPITAL_COMMUNITY)
Admission: RE | Admit: 2010-06-09 | Discharge: 2010-06-09 | Disposition: A | Discharge status: Home / Self Care | Payer: Medicare Other | Source: Ambulatory Visit | Attending: Cardiology | Admitting: Cardiology

## 2010-06-09 ENCOUNTER — Encounter (HOSPITAL_COMMUNITY)
Admission: RE | Admit: 2010-06-09 | Discharge: 2010-06-09 | Disposition: A | Discharge status: Home / Self Care | Payer: Medicare Other | Source: Ambulatory Visit | Attending: Cardiology | Admitting: Cardiology

## 2010-06-09 DIAGNOSIS — I10 Essential (primary) hypertension: Secondary | ICD-10-CM | POA: Insufficient documentation

## 2010-06-09 DIAGNOSIS — R079 Chest pain, unspecified: Secondary | ICD-10-CM | POA: Insufficient documentation

## 2010-06-09 MED ORDER — TECHNETIUM TC 99M TETROFOSMIN IV KIT
30.0000 | PACK | Freq: Once | INTRAVENOUS | Status: AC | PRN
  Administered 2010-06-09: 30 via INTRAVENOUS

## 2010-06-09 MED ORDER — TECHNETIUM TC 99M TETROFOSMIN IV KIT
10.0000 | PACK | Freq: Once | INTRAVENOUS | Status: AC | PRN
  Administered 2010-06-09: 10 via INTRAVENOUS

## 2011-01-01 ENCOUNTER — Other Ambulatory Visit: Payer: Self-pay | Admitting: Cardiology

## 2011-03-12 ENCOUNTER — Other Ambulatory Visit: Payer: Self-pay | Admitting: Cardiology

## 2011-03-16 ENCOUNTER — Other Ambulatory Visit: Payer: Self-pay | Admitting: Cardiology

## 2011-04-13 ENCOUNTER — Other Ambulatory Visit: Payer: Self-pay | Admitting: Cardiology

## 2011-07-20 ENCOUNTER — Other Ambulatory Visit: Payer: Self-pay | Admitting: Cardiology

## 2011-08-17 ENCOUNTER — Other Ambulatory Visit: Payer: Self-pay | Admitting: Cardiology

## 2012-01-21 ENCOUNTER — Ambulatory Visit: Payer: Medicare Other | Admitting: Internal Medicine

## 2012-01-25 ENCOUNTER — Emergency Department (INDEPENDENT_AMBULATORY_CARE_PROVIDER_SITE_OTHER)
Admission: EM | Admit: 2012-01-25 | Discharge: 2012-01-25 | Disposition: A | Discharge status: Home / Self Care | Payer: Medicare Other | Source: Home / Self Care | Attending: Family Medicine | Admitting: Family Medicine

## 2012-01-25 ENCOUNTER — Encounter (HOSPITAL_COMMUNITY): Payer: Self-pay | Admitting: *Deleted

## 2012-01-25 DIAGNOSIS — B029 Zoster without complications: Secondary | ICD-10-CM

## 2012-01-25 DIAGNOSIS — L723 Sebaceous cyst: Secondary | ICD-10-CM

## 2012-01-25 DIAGNOSIS — L089 Local infection of the skin and subcutaneous tissue, unspecified: Secondary | ICD-10-CM

## 2012-01-25 HISTORY — DX: Pure hypercholesterolemia, unspecified: E78.00

## 2012-01-25 HISTORY — DX: Unspecified dementia, unspecified severity, without behavioral disturbance, psychotic disturbance, mood disturbance, and anxiety: F03.90

## 2012-01-25 HISTORY — DX: Local infection of the skin and subcutaneous tissue, unspecified: L08.9

## 2012-01-25 HISTORY — DX: Heart disease, unspecified: I51.9

## 2012-01-25 HISTORY — DX: Cerebral infarction, unspecified: I63.9

## 2012-01-25 MED ORDER — VALACYCLOVIR HCL 1 G PO TABS: 1000.0000 mg | ORAL_TABLET | Freq: Three times a day (TID) | ORAL | Status: DC

## 2012-01-25 NOTE — ED Provider Notes (Signed)
History     CSN: [id number]  Arrival date & time 01/25/12  1148   First MD Initiated Contact with Patient 01/25/12 1355      Chief Complaint  Patient presents with  . Abscess  . Herpes Zoster    (Consider location/radiation/quality/duration/timing/severity/associated sxs/prior treatment) Patient is a 79 y.o. female presenting with abscess and rash. The history is provided by the patient and a relative.  Abscess  This is a new problem. The current episode started less than one week ago. The onset was gradual. The problem has been gradually worsening. The abscess is present on the back. The problem is moderate. The abscess is characterized by painfulness and swelling. The abscess first occurred at home.  Rash  This is a new problem. The current episode started more than 2 days ago. The problem has been gradually worsening. The problem is associated with an unknown factor. There has been no fever. The rash is present on the abdomen. The pain is mild. Associated symptoms include itching.    No past medical history on file.  No past surgical history on file.  No family history on file.  History  Substance Use Topics  . Smoking status: Not on file  . Smokeless tobacco: Not on file  . Alcohol Use: Not on file    OB History    No data available      Review of Systems  Constitutional: Negative.   Skin: Positive for itching and rash.    Allergies  Review of patient's allergies indicates no known allergies.  Home Medications   Current Outpatient Rx  Name  Route  Sig  Dispense  Refill  . VALACYCLOVIR HCL 1 G PO TABS   Oral   Take 1 tablet (1,000 mg total) by mouth 3 (three) times daily.   21 tablet   0     BP 153/80  Pulse 68  Temp 98.1 F (36.7 C) (Oral)  Resp 22  SpO2 98%  Physical Exam  Nursing note and vitals reviewed. Constitutional: She is oriented to person, place, and time. She appears well-developed and well-nourished.  Neurological: She is alert  and oriented to person, place, and time.  Skin: Skin is warm and dry. Rash noted.       Tender fluctuant erythematous 5cm sebaceous cyst/ abscess on right post iliac back. Also erythmatous based grouped vesicular patch on left lower abd, nonpustular, stops at umbilicus midline.    ED Course  INCISION AND DRAINAGE Date/Time: 01/25/2012 3:33 PM Performed by: Linna Hoff Authorized by: Bradd Canary D Consent: Verbal consent obtained. Risks and benefits: risks, benefits and alternatives were discussed Consent given by: patient Patient understanding: patient states understanding of the procedure being performed Type: abscess Body area: trunk Location details: back Local anesthetic: topical anesthetic Patient sedated: no Scalpel size: 11 Incision type: single straight Complexity: simple Drainage: purulent Drainage amount: copious Patient tolerance: Patient tolerated the procedure well with no immediate complications. Comments: Culture obtained.   (including critical care time)  Labs Reviewed - No data to display No results found.   1. Infected sebaceous cyst   2. Herpes zoster       MDM  I+d performed.       Linna Hoff, MD 01/25/12 [phone]

## 2012-01-25 NOTE — ED Notes (Signed)
C/o abscess on right buttocks onset 3-4 days ago.  No drainage.  Also at the same time got a shingles on LLQ abdomen.

## 2012-01-28 LAB — CULTURE, ROUTINE-ABSCESS

## 2012-01-30 ENCOUNTER — Inpatient Hospital Stay (HOSPITAL_COMMUNITY)
Admission: EM | Admit: 2012-01-30 | Discharge: 2012-02-04 | DRG: 470 | Disposition: A | Discharge status: Skilled Nursing Facility | Payer: Medicare Other | Attending: Cardiovascular Disease | Admitting: Cardiovascular Disease

## 2012-01-30 ENCOUNTER — Other Ambulatory Visit (HOSPITAL_COMMUNITY): Payer: Medicare Other

## 2012-01-30 ENCOUNTER — Emergency Department (HOSPITAL_COMMUNITY): Payer: Medicare Other

## 2012-01-30 ENCOUNTER — Inpatient Hospital Stay (HOSPITAL_COMMUNITY): Payer: Medicare Other

## 2012-01-30 ENCOUNTER — Encounter (HOSPITAL_COMMUNITY): Payer: Self-pay | Admitting: Emergency Medicine

## 2012-01-30 DIAGNOSIS — Z79899 Other long term (current) drug therapy: Secondary | ICD-10-CM

## 2012-01-30 DIAGNOSIS — E7800 Pure hypercholesterolemia, unspecified: Secondary | ICD-10-CM | POA: Diagnosis present

## 2012-01-30 DIAGNOSIS — Y92009 Unspecified place in unspecified non-institutional (private) residence as the place of occurrence of the external cause: Secondary | ICD-10-CM

## 2012-01-30 DIAGNOSIS — L723 Sebaceous cyst: Secondary | ICD-10-CM | POA: Diagnosis present

## 2012-01-30 DIAGNOSIS — Z8673 Personal history of transient ischemic attack (TIA), and cerebral infarction without residual deficits: Secondary | ICD-10-CM

## 2012-01-30 DIAGNOSIS — L089 Local infection of the skin and subcutaneous tissue, unspecified: Secondary | ICD-10-CM | POA: Diagnosis present

## 2012-01-30 DIAGNOSIS — J449 Chronic obstructive pulmonary disease, unspecified: Secondary | ICD-10-CM | POA: Diagnosis present

## 2012-01-30 DIAGNOSIS — I519 Heart disease, unspecified: Secondary | ICD-10-CM | POA: Diagnosis present

## 2012-01-30 DIAGNOSIS — I2510 Atherosclerotic heart disease of native coronary artery without angina pectoris: Secondary | ICD-10-CM | POA: Diagnosis present

## 2012-01-30 DIAGNOSIS — S72009A Fracture of unspecified part of neck of unspecified femur, initial encounter for closed fracture: Principal | ICD-10-CM | POA: Diagnosis present

## 2012-01-30 DIAGNOSIS — J4489 Other specified chronic obstructive pulmonary disease: Secondary | ICD-10-CM | POA: Diagnosis present

## 2012-01-30 DIAGNOSIS — F0390 Unspecified dementia without behavioral disturbance: Secondary | ICD-10-CM | POA: Diagnosis present

## 2012-01-30 DIAGNOSIS — F17200 Nicotine dependence, unspecified, uncomplicated: Secondary | ICD-10-CM | POA: Diagnosis present

## 2012-01-30 DIAGNOSIS — D62 Acute posthemorrhagic anemia: Secondary | ICD-10-CM | POA: Diagnosis not present

## 2012-01-30 DIAGNOSIS — I252 Old myocardial infarction: Secondary | ICD-10-CM

## 2012-01-30 DIAGNOSIS — S72001A Fracture of unspecified part of neck of right femur, initial encounter for closed fracture: Secondary | ICD-10-CM | POA: Diagnosis present

## 2012-01-30 DIAGNOSIS — B029 Zoster without complications: Secondary | ICD-10-CM | POA: Diagnosis present

## 2012-01-30 DIAGNOSIS — W19XXXA Unspecified fall, initial encounter: Secondary | ICD-10-CM | POA: Diagnosis present

## 2012-01-30 DIAGNOSIS — Y998 Other external cause status: Secondary | ICD-10-CM

## 2012-01-30 DIAGNOSIS — Z7982 Long term (current) use of aspirin: Secondary | ICD-10-CM

## 2012-01-30 DIAGNOSIS — R911 Solitary pulmonary nodule: Secondary | ICD-10-CM | POA: Diagnosis present

## 2012-01-30 DIAGNOSIS — Z720 Tobacco use: Secondary | ICD-10-CM | POA: Diagnosis present

## 2012-01-30 HISTORY — DX: Nonspecific reaction to tuberculin skin test without active tuberculosis: R76.11

## 2012-01-30 HISTORY — DX: Chronic obstructive pulmonary disease, unspecified: J44.9

## 2012-01-30 HISTORY — DX: Other disorders of lung: J98.4

## 2012-01-30 HISTORY — DX: Zoster without complications: B02.9

## 2012-01-30 HISTORY — DX: Sebaceous cyst: L72.3

## 2012-01-30 HISTORY — DX: Local infection of the skin and subcutaneous tissue, unspecified: L08.9

## 2012-01-30 LAB — CBC WITH DIFFERENTIAL/PLATELET
Eosinophils Absolute: 0.1 10*3/uL (ref 0.0–0.7)
Eosinophils Relative: 0 % (ref 0–5)
HCT: 32.4 % — ABNORMAL LOW (ref 36.0–46.0)
Hemoglobin: 11 g/dL — ABNORMAL LOW (ref 12.0–15.0)
Lymphocytes Relative: 9 % — ABNORMAL LOW (ref 12–46)
Lymphs Abs: 1 10*3/uL (ref 0.7–4.0)
MCH: 32.7 pg (ref 26.0–34.0)
MCV: 96.4 fL (ref 78.0–100.0)
Monocytes Absolute: 0.4 10*3/uL (ref 0.1–1.0)
Monocytes Relative: 4 % (ref 3–12)
Platelets: 231 10*3/uL (ref 150–400)
RBC: 3.36 MIL/uL — ABNORMAL LOW (ref 3.87–5.11)
WBC: 11.2 10*3/uL — ABNORMAL HIGH (ref 4.0–10.5)

## 2012-01-30 LAB — URINALYSIS, ROUTINE W REFLEX MICROSCOPIC
Bilirubin Urine: NEGATIVE
Glucose, UA: NEGATIVE mg/dL
Specific Gravity, Urine: 1.018 (ref 1.005–1.030)
Urobilinogen, UA: 0.2 mg/dL (ref 0.0–1.0)

## 2012-01-30 LAB — URINE MICROSCOPIC-ADD ON

## 2012-01-30 LAB — POCT I-STAT, CHEM 8
BUN: 24 mg/dL — ABNORMAL HIGH (ref 6–23)
Creatinine, Ser: 1.3 mg/dL — ABNORMAL HIGH (ref 0.50–1.10)
Glucose, Bld: 130 mg/dL — ABNORMAL HIGH (ref 70–99)
Hemoglobin: 11.2 g/dL — ABNORMAL LOW (ref 12.0–15.0)
TCO2: 21 mmol/L (ref 0–100)

## 2012-01-30 MED ORDER — HYDROCODONE-ACETAMINOPHEN 5-325 MG PO TABS: 1.0000 | ORAL_TABLET | Freq: Four times a day (QID) | ORAL | Status: DC | PRN

## 2012-01-30 MED ORDER — ONDANSETRON HCL 4 MG/2ML IJ SOLN: 4.0000 mg | Freq: Four times a day (QID) | INTRAMUSCULAR | Status: DC | PRN

## 2012-01-30 MED ORDER — ENOXAPARIN SODIUM 40 MG/0.4ML ~~LOC~~ SOLN
40.0000 mg | Freq: Once | SUBCUTANEOUS | Status: AC
  Administered 2012-01-30: 40 mg via SUBCUTANEOUS
  Filled 2012-01-30: qty 0.4

## 2012-01-30 MED ORDER — VALACYCLOVIR HCL 500 MG PO TABS
1000.0000 mg | ORAL_TABLET | ORAL | Status: DC
  Administered 2012-01-30 – 2012-02-03 (×5): 1000 mg via ORAL
  Filled 2012-01-30 (×6): qty 2

## 2012-01-30 MED ORDER — METOPROLOL SUCCINATE ER 25 MG PO TB24
25.0000 mg | ORAL_TABLET | Freq: Every day | ORAL | Status: DC
  Administered 2012-01-30 – 2012-02-04 (×6): 25 mg via ORAL
  Filled 2012-01-30 (×6): qty 1

## 2012-01-30 MED ORDER — ACETAMINOPHEN 325 MG PO TABS: 325.0000 mg | ORAL_TABLET | Freq: Four times a day (QID) | ORAL | Status: DC | PRN

## 2012-01-30 MED ORDER — ATORVASTATIN CALCIUM 10 MG PO TABS
10.0000 mg | ORAL_TABLET | Freq: Every day | ORAL | Status: DC
  Administered 2012-01-30 – 2012-02-03 (×5): 10 mg via ORAL
  Filled 2012-01-30 (×6): qty 1

## 2012-01-30 MED ORDER — ONDANSETRON HCL 4 MG PO TABS: 4.0000 mg | ORAL_TABLET | Freq: Four times a day (QID) | ORAL | Status: DC | PRN

## 2012-01-30 MED ORDER — MEMANTINE HCL 5 MG PO TABS
5.0000 mg | ORAL_TABLET | Freq: Every day | ORAL | Status: DC
  Administered 2012-01-30 – 2012-02-04 (×6): 5 mg via ORAL
  Filled 2012-01-30 (×6): qty 1

## 2012-01-30 MED ORDER — HYDROMORPHONE HCL PF 1 MG/ML IJ SOLN
0.5000 mg | INTRAMUSCULAR | Status: DC | PRN
  Administered 2012-01-30 – 2012-02-03 (×8): 0.5 mg via INTRAVENOUS
  Filled 2012-01-30 (×10): qty 1

## 2012-01-30 MED ORDER — ALUM & MAG HYDROXIDE-SIMETH 200-200-20 MG/5ML PO SUSP: 30.0000 mL | Freq: Four times a day (QID) | ORAL | Status: DC | PRN

## 2012-01-30 MED ORDER — CEFAZOLIN SODIUM-DEXTROSE 2-3 GM-% IV SOLR
2.0000 g | Freq: Once | INTRAVENOUS | Status: DC
  Filled 2012-01-30: qty 50

## 2012-01-30 MED ORDER — ALBUTEROL SULFATE HFA 108 (90 BASE) MCG/ACT IN AERS
2.0000 | INHALATION_SPRAY | Freq: Four times a day (QID) | RESPIRATORY_TRACT | Status: DC
  Administered 2012-01-30: 2 via RESPIRATORY_TRACT
  Filled 2012-01-30 (×2): qty 6.7

## 2012-01-30 MED ORDER — DOCUSATE SODIUM 100 MG PO CAPS
100.0000 mg | ORAL_CAPSULE | Freq: Two times a day (BID) | ORAL | Status: DC
  Administered 2012-01-30 – 2012-02-04 (×10): 100 mg via ORAL
  Filled 2012-01-30 (×12): qty 1

## 2012-01-30 MED ORDER — SERTRALINE HCL 25 MG PO TABS
25.0000 mg | ORAL_TABLET | Freq: Every day | ORAL | Status: DC
  Administered 2012-01-30 – 2012-02-04 (×6): 25 mg via ORAL
  Filled 2012-01-30 (×6): qty 1

## 2012-01-30 MED ORDER — AMLODIPINE BESYLATE 5 MG PO TABS
5.0000 mg | ORAL_TABLET | Freq: Every day | ORAL | Status: DC
  Administered 2012-01-30 – 2012-01-31 (×2): 5 mg via ORAL
  Filled 2012-01-30 (×2): qty 1

## 2012-01-30 MED ORDER — HEPARIN SODIUM (PORCINE) 5000 UNIT/ML IJ SOLN
5000.0000 [IU] | Freq: Three times a day (TID) | INTRAMUSCULAR | Status: DC
  Filled 2012-01-30: qty 1

## 2012-01-30 MED ORDER — SODIUM CHLORIDE 0.9 % IJ SOLN
3.0000 mL | Freq: Two times a day (BID) | INTRAMUSCULAR | Status: DC
  Administered 2012-01-30 – 2012-02-04 (×8): 3 mL via INTRAVENOUS

## 2012-01-30 MED ORDER — KCL IN DEXTROSE-NACL 20-5-0.45 MEQ/L-%-% IV SOLN
INTRAVENOUS | Status: DC
  Administered 2012-01-30 – 2012-01-31 (×2): via INTRAVENOUS
  Filled 2012-01-30 (×4): qty 1000

## 2012-01-30 NOTE — ED Notes (Signed)
Per EMS - pt called out earlier today with c/o hip/groin pain. Helped get the pt back into bed. Pt refused to go the ED. Pt called again 15 mins later. And reported that pain had worsened. Pt c/o right hip/groin pain that radiates to rectum. No shortening or turning of leg. ems stabilized leg. EMS administered 150 mcg of fentanyl. Vss. Pain reported 10/10 for pain, now at 0/10. ems started an IV in left AC. BP 177/99 HR 72 regular. RR 16 O2 sat 99%.

## 2012-01-30 NOTE — Consult Note (Signed)
[facility]opaedic [facility] Consult  Reason for Consult:R femoral neck fracture Referring Physician: Thurnell Lose, MD  PCP: Dr. Sharyn Lull  HPI:   The patient is an 79 year old African American female who presented to the emergency department this evening after sustaining an unwitnessed ground-level fall at her home bathroom. The patient resides with her daughter. Patients daughter heard a noise and this was followed by screaming for help. Daughter tried to pick up her mother but was unsuccessful. They then had their home health aide come and try to help her up as well again unsuccessful. EMS was called for assistance and help the patient back to bed. This was done and the patient initially refused transfer to the hospital. Several hours later patient began to scream in pain. EMS was called once again and brought her to the emergency department. Workup in the emergency department showed right femoral neck fracture. Orthopedics was consult and regarding this injury. given the patient's medical issues she is admitted to the medicine service.   currently patient is seen in the emergency department in [facility], she is accompanied by her granddaughter. The patient is very comfortable. States that she is not having any right hip pain at the current time. She denies any additional injuries elsewhere. Denies any chest pain, shortness of breath. No palpitations. No abdominal pain. Denies any numbness or tingling in her lower extremities.  Per a report from the granddaughter patient does not use any assistive devices at home. She is fairly independent. Again she lives with her daughter in a two-level house. The patient's bedroom is upstairs and she navigate stairs on her regular basis. She is able to perform most activities of daily living. The patient does have dementia.   A review of the chart does show that the patient was recently seen at the outpatient urgent care center for an infected sebaceous cyst over her back  as well as shingles to the left lower quadrant of her abdomen. Review of cultures obtained from the cyst fluid shows gram-positive cocci in pairs, negative for staph aureus or strep pyogenes. There were multiple organisms present but no predominant species. Patient was started on valacyclovir. Do not see that the patient was started on any antibiotics. No apparent sequela reported as the patient did not even report of these issues on evaluation today.   Patient is also noted to have a right upper lobe nodule that is being followed by Dr. [name] with pulmonology. The patient has refused a biopsy of this nodule. Also u of the pulmonology notes patient reports history of a positive PPD but was never treated  Past Medical History  Diagnosis Date  . Dementia   . Stroke   . High cholesterol   . Heart disease, unspecified   . LUNG NODULE 03/30/2008    Qualifier: Diagnosis of  By: [name] MD, [name]   . COPD 03/30/2008    Qualifier: Diagnosis of  By: [name] MD, [name]   . Infected sebaceous cyst 01/25/2012  . Herpes zoster 01/30/2012  . History of positive PPD, untreated     History reviewed. No pertinent past surgical history.  Family History  Problem Relation Age of Onset  . Stroke Mother     Social History:  reports that she has been smoking Cigarettes.  She has been smoking about .2 packs per day. She does not have any smokeless tobacco history on file. She reports that she does not drink alcohol or use illicit drugs. Patient lives at home with  her daughter. They live in a two-story house. Patient is fairly independent. She does not use any assistive devices.  Allergies: No Known Allergies  Medications:  I have reviewed the patient's current medications. Prior to Admission:  Prescriptions prior to admission  Medication Sig Dispense Refill  . amLODipine (NORVASC) 5 MG tablet Take 5 mg by mouth daily.      Marland Kitchen aspirin EC 81 MG tablet Take 81 mg by mouth daily.      Marland Kitchen atorvastatin (LIPITOR)  10 MG tablet Take 10 mg by mouth daily.      . memantine (NAMENDA) 5 MG tablet Take 5 mg by mouth daily.      . metoprolol succinate (TOPROL-XL) 25 MG 24 hr tablet Take 25 mg by mouth daily.      . sertraline (ZOLOFT) 25 MG tablet Take 25 mg by mouth daily.      . valACYclovir (VALTREX) 1000 MG tablet Take 1 tablet (1,000 mg total) by mouth 3 (three) times daily.  21 tablet  0   Scheduled:    . albuterol  2 puff Inhalation QID  . docusate sodium  100 mg Oral BID  . enoxaparin (LOVENOX) injection  40 mg Subcutaneous Once  . sodium chloride  3 mL Intravenous Q12H    Results for orders placed during the hospital encounter of 01/30/12 (from the past 48 hour(s))  CBC WITH DIFFERENTIAL     Status: Abnormal   Collection Time   01/30/12  2:17 PM      Component Value Range Comment   WBC 11.2 (*) 4.0 - 10.5 K/uL    RBC 3.36 (*) 3.87 - 5.11 MIL/uL    Hemoglobin 11.0 (*) 12.0 - 15.0 g/dL    HCT 04.5 (*) 40.9 - 46.0 %    MCV 96.4  78.0 - 100.0 fL    MCH 32.7  26.0 - 34.0 pg    MCHC 34.0  30.0 - 36.0 g/dL    RDW 81.1  91.4 - 78.2 %    Platelets 231  150 - 400 K/uL    Neutrophils Relative 87 (*) 43 - 77 %    Neutro Abs 9.7 (*) 1.7 - 7.7 K/uL    Lymphocytes Relative 9 (*) 12 - 46 %    Lymphs Abs 1.0  0.7 - 4.0 K/uL    Monocytes Relative 4  3 - 12 %    Monocytes Absolute 0.4  0.1 - 1.0 K/uL    Eosinophils Relative 0  0 - 5 %    Eosinophils Absolute 0.1  0.0 - 0.7 K/uL    Basophils Relative 0  0 - 1 %    Basophils Absolute 0.0  0.0 - 0.1 K/uL   POCT I-STAT, CHEM 8     Status: Abnormal   Collection Time   01/30/12  2:56 PM      Component Value Range Comment   Sodium 140  135 - 145 mEq/L    Potassium 3.4 (*) 3.5 - 5.1 mEq/L    Chloride 111  96 - 112 mEq/L    BUN 24 (*) 6 - 23 mg/dL    Creatinine, Ser 9.56 (*) 0.50 - 1.10 mg/dL    Glucose, Bld 213 (*) 70 - 99 mg/dL    Calcium, Ion 0.86  5.78 - 1.30 mmol/L    TCO2 21  0 - 100 mmol/L    Hemoglobin 11.2 (*) 12.0 - 15.0 g/dL    HCT 46.9 (*)  62.9 - 46.0 %  URINALYSIS, ROUTINE W REFLEX MICROSCOPIC     Status: Abnormal   Collection Time   01/30/12  3:55 PM      Component Value Range Comment   Color, Urine YELLOW  YELLOW    APPearance CLOUDY (*) CLEAR    Specific Gravity, Urine 1.018  1.005 - 1.030    pH 6.0  5.0 - 8.0    Glucose, UA NEGATIVE  NEGATIVE mg/dL    Hgb urine dipstick SMALL (*) NEGATIVE    Bilirubin Urine NEGATIVE  NEGATIVE    Ketones, ur 15 (*) NEGATIVE mg/dL    Protein, ur 30 (*) NEGATIVE mg/dL    Urobilinogen, UA 0.2  0.0 - 1.0 mg/dL    Nitrite NEGATIVE  NEGATIVE    Leukocytes, UA NEGATIVE  NEGATIVE   URINE MICROSCOPIC-ADD ON     Status: Abnormal   Collection Time   01/30/12  3:55 PM      Component Value Range Comment   Squamous Epithelial / LPF RARE  RARE    WBC, UA 0-2  <3 WBC/hpf    RBC / HPF 0-2  <3 RBC/hpf    Casts HYALINE CASTS (*) NEGATIVE GRANULAR CAST    Dg Hip Complete Right  01/30/2012  *RADIOLOGY REPORT*  Clinical Data: Hip pain post fall  RIGHT HIP - COMPLETE 2+ VIEW  Comparison: None.  Findings: Three views of the right hip submitted.  There is displaced fracture of the right femoral neck.  IMPRESSION: Displaced fracture of the right femoral neck.   Original Report Authenticated By: Natasha Mead, M.D.    Ct Head Wo Contrast  01/30/2012  *RADIOLOGY REPORT*  Clinical Data:  Demented patient with possible fall.  CT HEAD WITHOUT CONTRAST CT CERVICAL SPINE WITHOUT CONTRAST  Technique:  Multidetector CT imaging of the head and cervical spine was performed following the standard protocol without intravenous contrast.  Multiplanar CT image reconstructions of the cervical spine were also generated.  Comparison:  None.  CT HEAD  Findings: Severe cortical atrophy, mild deep atrophy, and severe cerebellar atrophy.  Moderate to severe changes of small vessel disease of the white matter diffusely.  Physiologic calcifications in the basal ganglia, left greater than right.  No mass lesion.  No midline shift.  No  acute hemorrhage or hematoma.  No extra-axial fluid collections.  No evidence of acute infarction.  No skull fracture or other focal osseous abnormality involving the skull.  Opacification of scattered bilateral ethmoid air cells. Remaining visualized paranasal sinuses, bilateral mastoid air cells, and bilateral middle ear cavities well-aerated. Bilateral carotid siphon atherosclerosis.  IMPRESSION:  1.  No acute intracranial abnormality. 2.  Generalized atrophy.  Moderate to severe chronic microvascular ischemic changes of the white matter.  CT CERVICAL SPINE  Findings: No acute fractures identified involving the cervical spine.  Anterior wedge compression deformity of C3 which does not appear acute.  Disc space narrowing endplate hypertrophic changes at essentially every cervical and upper thoracic level.  No spinal stenosis.  Facet joints intact with severe diffuse degenerative changes. Coronal reformatted images demonstrate an intact craniocervical junction, intact C1-C2 articulation, intact dens, and intact lateral masses.  Combination of facet and uncinate hypertrophy account for multilevel foraminal stenoses including moderate bilateral C3-4, mild right and moderate left C4-5, severe left and moderate right C5-6, severe bilateral C6-7, severe bilateral C7-T1.  Generalized osseous demineralization.  Visualized lung apices clear.  Multiple small nodules within the thyroid gland without evidence of a dominant nodule.  IMPRESSION:  1.  No acute  fractures identified involving the cervical spine.  2.  Old wedge compression deformity of the anterior C3 vertebral body. 4.  Multilevel degenerative disc disease, spondylosis, and facet degenerative changes with multilevel foraminal stenoses as detailed above.   Original Report Authenticated By: Hulan Saas, M.D.    Ct Cervical Spine Wo Contrast  01/30/2012  *RADIOLOGY REPORT*  Clinical Data:  Demented patient with possible fall.  CT HEAD WITHOUT CONTRAST CT  CERVICAL SPINE WITHOUT CONTRAST  Technique:  Multidetector CT imaging of the head and cervical spine was performed following the standard protocol without intravenous contrast.  Multiplanar CT image reconstructions of the cervical spine were also generated.  Comparison:  None.  CT HEAD  Findings: Severe cortical atrophy, mild deep atrophy, and severe cerebellar atrophy.  Moderate to severe changes of small vessel disease of the white matter diffusely.  Physiologic calcifications in the basal ganglia, left greater than right.  No mass lesion.  No midline shift.  No acute hemorrhage or hematoma.  No extra-axial fluid collections.  No evidence of acute infarction.  No skull fracture or other focal osseous abnormality involving the skull.  Opacification of scattered bilateral ethmoid air cells. Remaining visualized paranasal sinuses, bilateral mastoid air cells, and bilateral middle ear cavities well-aerated. Bilateral carotid siphon atherosclerosis.  IMPRESSION:  1.  No acute intracranial abnormality. 2.  Generalized atrophy.  Moderate to severe chronic microvascular ischemic changes of the white matter.  CT CERVICAL SPINE  Findings: No acute fractures identified involving the cervical spine.  Anterior wedge compression deformity of C3 which does not appear acute.  Disc space narrowing endplate hypertrophic changes at essentially every cervical and upper thoracic level.  No spinal stenosis.  Facet joints intact with severe diffuse degenerative changes. Coronal reformatted images demonstrate an intact craniocervical junction, intact C1-C2 articulation, intact dens, and intact lateral masses.  Combination of facet and uncinate hypertrophy account for multilevel foraminal stenoses including moderate bilateral C3-4, mild right and moderate left C4-5, severe left and moderate right C5-6, severe bilateral C6-7, severe bilateral C7-T1.  Generalized osseous demineralization.  Visualized lung apices clear.  Multiple small  nodules within the thyroid gland without evidence of a dominant nodule.  IMPRESSION:  1.  No acute fractures identified involving the cervical spine.  2.  Old wedge compression deformity of the anterior C3 vertebral body. 4.  Multilevel degenerative disc disease, spondylosis, and facet degenerative changes with multilevel foraminal stenoses as detailed above.   Original Report Authenticated By: Hulan Saas, M.D.     Review of Systems  Constitutional: Negative for fever and chills.  HENT: Negative for sore throat.   Respiratory: Negative for cough, shortness of breath and wheezing.   Cardiovascular: Negative for chest pain.  Gastrointestinal: Negative for nausea, vomiting and abdominal pain.  Genitourinary:       Foley placed in ED  Musculoskeletal:       No real reports of R hip pain   Neurological: Negative for tingling, focal weakness and headaches.  Endo/Heme/Allergies: Does not bruise/bleed easily.  Psychiatric/Behavioral: Positive for memory loss.   Blood pressure 180/70, pulse 79, temperature 98.2 F (36.8 C), temperature source Oral, resp. rate 18, height 5\' 1"  (1.549 m), weight 43.7 kg (96 lb 5.5 oz), SpO2 95.00%. Physical Exam  Constitutional: Vital signs are normal. She is cooperative. No distress.       Very pleasant 79 year old appears younger than stated age. Resting comfortably in bed, in no acute distress  HENT:  Head: Normocephalic and atraumatic.  Mouth/Throat: Mucous membranes  are dry.  Neck: Normal range of motion and full passive range of motion without pain. Neck supple. No spinous process tenderness and no muscular tenderness present. Carotid bruit is not present.  Cardiovascular:       Regular, S1 and S2   Respiratory:       Clear  GI:       Soft, nontender, positive bowel sounds  Genitourinary:       Foley catheter is in place  Musculoskeletal:       Pelvis: Nontender with evaluation of bony structures. No pain with lateral compression or AP compression.  No instability.  Bilateral upper extremities and left lower extremity    No acute findings are noted   Motor and sensory functions are grossly intact   Extremities are warm with palpable pulses   No swelling  Did not examine the back at the area of sebaceous cyst   Right lower extremity   Leg is held in slight external rotation and is slightly shortened, not severe   A minimal tenderness with palpation of her right hip   No tenderness to palpation of the femoral shaft, knee, tibia, ankle or foot   Deep peroneal nerve, superficial peroneal nerve and tibial nerve sensory functions are intact femoral nerve sensory function is intact   EHL, FHL, and 2 tibialis, posterior tibialis, peroneals and gastrocsoleus complex motor function intact   The patient can perform a quad contraction   Palpable dorsalis pedis pulses noted  Compartments are soft and nontender   Extremity is warm   No significant swelling    Neurological: She is alert.    Assessment/Plan:   79 year old female status post ground level fall with acute right femoral neck fracture, displaced  1. Fall   likely mechanical. Suspect some balance and coordination issues given dementia.  2-D echo has been ordered for the morning. 2. displaced subcapital right femoral neck fracture, OTA: 31-B3  This particular pattern is amenable to surgical fixation. It would not do well with nonoperative treatment.  I discussed this with the patient and granddaughter. They're both agreeable to surgical fixation. I do believe that the risks of nonoperative treatment are greater than those associated with surgery.  Patient is a candidate for a right hip hemiarthroplasty  We will plan on proceeding with surgery tomorrow late morning after the echo has been completed  Patient will remain on bedrest for now  She will be weightbearing as tolerated after surgery with posterior hip precautions  we feel that surgery will enable the patient to mobilize  better and decrease her chances of perioperative complications from a cardiopulmonary standpoint as well as soft tissue perspective   Okay to place blanket or pillow under knee to provide a little hip flexion for comfort if needed  Ice as needed   Low energy fall with hip fracture = probable osteoporosis (also appears pt has had some c-spine fxs in past as well)   Likely primary osteoporosis due to age   Will check vitamin D levels   Will start on calcium and vitamin D after surgery   Will discuss DEXA with pt and family, leave it up to them if they want to obtain but would recommend.    Can also discuss pharmacologic therapy as well    3. recent I&D of sebaceous cyst with positive Gram stain  Culture did show some gram-positive cocci in pairs but there were multiple organisms without any predominant species  Infection was localized to abscess  Will  reevaluate back prior to surgery to make sure the erythema and area of fluctuance has resolved 4. recent herpes zoster flare  Patient was started valtrex. Continue this until course is completed 5. right upper lobe nodule/COPD and history of positive PPD, treated  The patient has refused biopsy  Will check a preoperative chest x-ray as part of routine preoperative evaluation  Patient is being followed by Dr. [name], pulmonology 6. DVT/PE prophylaxis  lovenox x 1 now  Will resume after surgery  Hold ASA 7. FEN  Reg diet for now   NPO after MN    Grand-daughter states that pt eats whatever she wants. No issues with coughing or choking while eating.  Probably do not need bedside swallow eval but will monitor.  8. Activity  Bed rest for now  IS q 1 hour while awake 9. Pain  Tylenol  Low dose norco for severe pain  Limit narcotics  IV tylenol post op x 24 hours 10. Pre-op w/u  CXR  2 view xray of R knee  Type and screen   11. Dispo  Admission  OR tomorrow  Pt will likely need short term SNF, possibly CIR depending on level of  mobility after surgery.    Mearl Latin, PA-C [facility] [phone] (P) 01/30/2012, 8:04 PM

## 2012-01-30 NOTE — H&P (Signed)
Annette Cantrell is an 79 y.o. female.   Chief Complaint: Right hip pain. HPI: 79 year old female with history of dementia, stroke, and heart disease presents complaining of right hip pain. Patient lives with her daughter home. Her daughter noticed that patient was limping and complaining of pain to her right hip. EMS was called and initially patient refused to come to the ER. EMS were called back to home 15 minutes later because patient had worsening right hip pain. Pain initially reported as 10 out of 10. EMS decided to stabilize her hip using fracture protocol, gave patient 150 MCG of fentanyl. Patient subsequently states she is pain free after receiving pain medication. History is limited due to her history of dementia.   Past Medical History  Diagnosis Date  . Dementia   . Stroke   . High cholesterol   . Heart disease, unspecified       History reviewed. No pertinent past surgical history.  Family History  Problem Relation Age of Onset  . Stroke Mother    Social History:  reports that she has been smoking Cigarettes.  She has been smoking about .2 packs per day. She does not have any smokeless tobacco history on file. She reports that she does not drink alcohol or use illicit drugs.  Allergies: No Known Allergies   (Not in a hospital admission)  Results for orders placed during the hospital encounter of 01/30/12 (from the past 48 hour(s))  CBC WITH DIFFERENTIAL     Status: Abnormal   Collection Time   01/30/12  2:17 PM      Component Value Range Comment   WBC 11.2 (*) 4.0 - 10.5 K/uL    RBC 3.36 (*) 3.87 - 5.11 MIL/uL    Hemoglobin 11.0 (*) 12.0 - 15.0 g/dL    HCT 86.5 (*) 78.4 - 46.0 %    MCV 96.4  78.0 - 100.0 fL    MCH 32.7  26.0 - 34.0 pg    MCHC 34.0  30.0 - 36.0 g/dL    RDW 69.6  29.5 - 28.4 %    Platelets 231  150 - 400 K/uL    Neutrophils Relative 87 (*) 43 - 77 %    Neutro Abs 9.7 (*) 1.7 - 7.7 K/uL    Lymphocytes Relative 9 (*) 12 - 46 %    Lymphs Abs 1.0   0.7 - 4.0 K/uL    Monocytes Relative 4  3 - 12 %    Monocytes Absolute 0.4  0.1 - 1.0 K/uL    Eosinophils Relative 0  0 - 5 %    Eosinophils Absolute 0.1  0.0 - 0.7 K/uL    Basophils Relative 0  0 - 1 %    Basophils Absolute 0.0  0.0 - 0.1 K/uL   POCT I-STAT, CHEM 8     Status: Abnormal   Collection Time   01/30/12  2:56 PM      Component Value Range Comment   Sodium 140  135 - 145 mEq/L    Potassium 3.4 (*) 3.5 - 5.1 mEq/L    Chloride 111  96 - 112 mEq/L    BUN 24 (*) 6 - 23 mg/dL    Creatinine, Ser 1.32 (*) 0.50 - 1.10 mg/dL    Glucose, Bld 440 (*) 70 - 99 mg/dL    Calcium, Ion 1.02  7.25 - 1.30 mmol/L    TCO2 21  0 - 100 mmol/L    Hemoglobin 11.2 (*) 12.0 - 15.0 g/dL  HCT 33.0 (*) 36.0 - 46.0 %   URINALYSIS, ROUTINE W REFLEX MICROSCOPIC     Status: Abnormal   Collection Time   01/30/12  3:55 PM      Component Value Range Comment   Color, Urine YELLOW  YELLOW    APPearance CLOUDY (*) CLEAR    Specific Gravity, Urine 1.018  1.005 - 1.030    pH 6.0  5.0 - 8.0    Glucose, UA NEGATIVE  NEGATIVE mg/dL    Hgb urine dipstick SMALL (*) NEGATIVE    Bilirubin Urine NEGATIVE  NEGATIVE    Ketones, ur 15 (*) NEGATIVE mg/dL    Protein, ur 30 (*) NEGATIVE mg/dL    Urobilinogen, UA 0.2  0.0 - 1.0 mg/dL    Nitrite NEGATIVE  NEGATIVE    Leukocytes, UA NEGATIVE  NEGATIVE   URINE MICROSCOPIC-ADD ON     Status: Abnormal   Collection Time   01/30/12  3:55 PM      Component Value Range Comment   Squamous Epithelial / LPF RARE  RARE    WBC, UA 0-2  <3 WBC/hpf    RBC / HPF 0-2  <3 RBC/hpf    Casts HYALINE CASTS (*) NEGATIVE GRANULAR CAST   Dg Hip Complete Right  01/30/2012  *RADIOLOGY REPORT*  Clinical Data: Hip pain post fall  RIGHT HIP - COMPLETE 2+ VIEW  Comparison: None.  Findings: Three views of the right hip submitted.  There is displaced fracture of the right femoral neck.  IMPRESSION: Displaced fracture of the right femoral neck.   Original Report Authenticated By: Natasha Mead, M.D.     Ct Head Wo Contrast  01/30/2012  *RADIOLOGY REPORT*  Clinical Data:  Demented patient with possible fall.  CT HEAD WITHOUT CONTRAST CT CERVICAL SPINE WITHOUT CONTRAST  Technique:  Multidetector CT imaging of the head and cervical spine was performed following the standard protocol without intravenous contrast.  Multiplanar CT image reconstructions of the cervical spine were also generated.  Comparison:  None.  CT HEAD  Findings: Severe cortical atrophy, mild deep atrophy, and severe cerebellar atrophy.  Moderate to severe changes of small vessel disease of the white matter diffusely.  Physiologic calcifications in the basal ganglia, left greater than right.  No mass lesion.  No midline shift.  No acute hemorrhage or hematoma.  No extra-axial fluid collections.  No evidence of acute infarction.  No skull fracture or other focal osseous abnormality involving the skull.  Opacification of scattered bilateral ethmoid air cells. Remaining visualized paranasal sinuses, bilateral mastoid air cells, and bilateral middle ear cavities well-aerated. Bilateral carotid siphon atherosclerosis.  IMPRESSION:  1.  No acute intracranial abnormality. 2.  Generalized atrophy.  Moderate to severe chronic microvascular ischemic changes of the white matter.  CT CERVICAL SPINE  Findings: No acute fractures identified involving the cervical spine.  Anterior wedge compression deformity of C3 which does not appear acute.  Disc space narrowing endplate hypertrophic changes at essentially every cervical and upper thoracic level.  No spinal stenosis.  Facet joints intact with severe diffuse degenerative changes. Coronal reformatted images demonstrate an intact craniocervical junction, intact C1-C2 articulation, intact dens, and intact lateral masses.  Combination of facet and uncinate hypertrophy account for multilevel foraminal stenoses including moderate bilateral C3-4, mild right and moderate left C4-5, severe left and moderate right  C5-6, severe bilateral C6-7, severe bilateral C7-T1.  Generalized osseous demineralization.  Visualized lung apices clear.  Multiple small nodules within the thyroid gland without evidence of a  dominant nodule.  IMPRESSION:  1.  No acute fractures identified involving the cervical spine.  2.  Old wedge compression deformity of the anterior C3 vertebral body. 4.  Multilevel degenerative disc disease, spondylosis, and facet degenerative changes with multilevel foraminal stenoses as detailed above.   Original Report Authenticated By: Hulan Saas, M.D.    Ct Cervical Spine Wo Contrast  01/30/2012  *RADIOLOGY REPORT*  Clinical Data:  Demented patient with possible fall.  CT HEAD WITHOUT CONTRAST CT CERVICAL SPINE WITHOUT CONTRAST  Technique:  Multidetector CT imaging of the head and cervical spine was performed following the standard protocol without intravenous contrast.  Multiplanar CT image reconstructions of the cervical spine were also generated.  Comparison:  None.  CT HEAD  Findings: Severe cortical atrophy, mild deep atrophy, and severe cerebellar atrophy.  Moderate to severe changes of small vessel disease of the white matter diffusely.  Physiologic calcifications in the basal ganglia, left greater than right.  No mass lesion.  No midline shift.  No acute hemorrhage or hematoma.  No extra-axial fluid collections.  No evidence of acute infarction.  No skull fracture or other focal osseous abnormality involving the skull.  Opacification of scattered bilateral ethmoid air cells. Remaining visualized paranasal sinuses, bilateral mastoid air cells, and bilateral middle ear cavities well-aerated. Bilateral carotid siphon atherosclerosis.  IMPRESSION:  1.  No acute intracranial abnormality. 2.  Generalized atrophy.  Moderate to severe chronic microvascular ischemic changes of the white matter.  CT CERVICAL SPINE  Findings: No acute fractures identified involving the cervical spine.  Anterior wedge compression  deformity of C3 which does not appear acute.  Disc space narrowing endplate hypertrophic changes at essentially every cervical and upper thoracic level.  No spinal stenosis.  Facet joints intact with severe diffuse degenerative changes. Coronal reformatted images demonstrate an intact craniocervical junction, intact C1-C2 articulation, intact dens, and intact lateral masses.  Combination of facet and uncinate hypertrophy account for multilevel foraminal stenoses including moderate bilateral C3-4, mild right and moderate left C4-5, severe left and moderate right C5-6, severe bilateral C6-7, severe bilateral C7-T1.  Generalized osseous demineralization.  Visualized lung apices clear.  Multiple small nodules within the thyroid gland without evidence of a dominant nodule.  IMPRESSION:  1.  No acute fractures identified involving the cervical spine.  2.  Old wedge compression deformity of the anterior C3 vertebral body. 4.  Multilevel degenerative disc disease, spondylosis, and facet degenerative changes with multilevel foraminal stenoses as detailed above.   Original Report Authenticated By: Hulan Saas, M.D.     @ROS @ Non-contributory and unable to provide information.  Physical Exam  Blood pressure 177/68, pulse 72, temperature 98.8 F (37.1 C), temperature source Oral, resp. rate 16, SpO2 99.00%.  Constitutional: She appears well-developed and well-nourished. Thin-appearing female in no acute distress  HENT: Head: Normocephalic and atraumatic. Eyes: Brown eyes, Bilateral mild haziness of both lens. Mouth/Throat: No oropharyngeal exudate. No scalp tenderness.  Neck: Neck supple.  Cardiovascular: Normal rate, regular rhythm and intact distal pulses.  Pulmonary/Chest: Effort normal and breath sounds normal. She has no rales. She exhibits no tenderness.  Abdominal: Soft. There is no tenderness.  Genitourinary: Wearing adult diaper.  Musculoskeletal: Tenderness to right hip with hip flexion. Decreased  range of motion due to pain. No overlying skin changes. No deformity noted. Mild shortness off right leg without external rotation. Pedal pulse palpable. No foot drop. Decreased strength due to pain. She exhibits no edema. no crepitus.  Left hip: Normal. Right knee: Normal.  Left knee: Normal. Cervical back: Normal. Thoracic back: Normal. Lumbar back: Normal. No midline spine tenderness, crepitus, or step-off  Neurological: Opens eyes to calling. Alert only to self. No facial droop. No obvious focal neuro deficits.  Skin: Skin is warm. No rash noted.     Assessment/Plan Right femoral neck fracture Dementia Stroke Dyslipidemia  Admit/Analgesics/Surgery per Dr. Kristopher Oppenheim S 01/30/2012, 5:13 PM

## 2012-01-30 NOTE — ED Notes (Signed)
Pt presents to department for evaluation of R hip pain. Family member states she found her laying on floor in bathroom. Upon arrival shortening of R leg noted. Pt states she did not want to come to ED, but pain has progressed and she is uncomfortable. 3/10 pain at the time. Limited ROM of R hip due to pain. She is alert, can answer simple yes and no questions.

## 2012-01-30 NOTE — ED Notes (Signed)
Pt removed from LSB by EDP, c-collar remains in place.

## 2012-01-30 NOTE — ED Notes (Signed)
Report given to Becky Rn

## 2012-01-30 NOTE — ED Provider Notes (Signed)
History     CSN: [id number]  Arrival date & time 01/30/12  1353   First MD Initiated Contact with Patient 01/30/12 1354      Chief Complaint  Patient presents with  . Hip Pain    (Consider location/radiation/quality/duration/timing/severity/associated sxs/prior treatment) HPI  79 year old female with history of dementia, stroke, and heart disease presents complaining of right hip pain. Patient lives with her daughter home. Her daughter noticed that patient was limping and complaining of pain to her right hip. EMS was called and initially patient refused to come to the ER. EMS were called back to home 15 minutes later because patient had worsening right hip pain. Pain initially reported as 10 out of 10. EMS decided to stabilize her hip using fracture protocol, gave patient 150 MCG of fentanyl. Patient subsequently states she is pain free after receiving pain medication. History is limited due to her history of dementia. However patient denies headache, neck pain, chest pain, back pain, trouble breathing, abdominal pain, dysuria. Patient does not recall falling or acknowledge that she fell.  Her daughter is unsure if any incident happened.  Daughter sts pt is of her usual mental status.  Pt normally ambulate without assist at home.     Past Medical History  Diagnosis Date  . Dementia   . Stroke   . High cholesterol   . Heart disease, unspecified     No past surgical history on file.  Family History  Problem Relation Age of Onset  . Stroke Mother     History  Substance Use Topics  . Smoking status: Current Every Day Smoker -- 0.2 packs/day    Types: Cigarettes  . Smokeless tobacco: Not on file  . Alcohol Use: No    OB History    Grav Para Term Preterm Abortions TAB SAB Ect Mult Living                  Review of Systems  Unable to perform ROS: Dementia    Allergies  Review of patient's allergies indicates no known allergies.  Home Medications   Current Outpatient  Rx  Name  Route  Sig  Dispense  Refill  . AMLODIPINE BESYLATE 5 MG PO TABS   Oral   Take 5 mg by mouth daily.         . ATORVASTATIN CALCIUM 10 MG PO TABS   Oral   Take 10 mg by mouth daily.         Marland Kitchen MEMANTINE HCL 5 MG PO TABS   Oral   Take 5 mg by mouth daily.         Marland Kitchen METOPROLOL SUCCINATE ER 25 MG PO TB24   Oral   Take 25 mg by mouth daily.         . SERTRALINE HCL 25 MG PO TABS   Oral   Take 25 mg by mouth daily.         Marland Kitchen VALACYCLOVIR HCL 1 G PO TABS   Oral   Take 1 tablet (1,000 mg total) by mouth 3 (three) times daily.   21 tablet   0     There were no vitals taken for this visit.  Physical Exam  Nursing note and vitals reviewed. Constitutional: She appears well-developed and well-nourished.       Thin-appearing female in no acute distress  HENT:  Head: Normocephalic and atraumatic.  Right Ear: External ear normal.  Left Ear: External ear normal.  Mouth/Throat: No oropharyngeal exudate.  No scalp tenderness.  Eyes: Conjunctivae normal are normal. Pupils are equal, round, and reactive to light.       Patient kept her eyes closed, does not follow direction for me to evaluate extraocular movement  Neck: Neck supple.       No midline spine tenderness. Soft collar applied.  Cardiovascular: Normal rate, regular rhythm and intact distal pulses.   Pulmonary/Chest: Effort normal and breath sounds normal. She has no rales. She exhibits no tenderness.  Abdominal: Soft. There is no tenderness.  Genitourinary:       Wearing adult diaper.  Musculoskeletal: She exhibits tenderness (Tenderness to right hip with hip flexion. Decreased range of motion due to pain. No overlying skin changes. No deformity noted. Mild shortness off right leg without external rotation. Pedal pulse palpable. No foot drop. Decreased strength due to pain ). She exhibits no edema.       Right hip: She exhibits decreased range of motion, decreased strength, tenderness and bony  tenderness. She exhibits no crepitus and no deformity.       Left hip: Normal.       Right knee: Normal.       Left knee: Normal.       Cervical back: Normal.       Thoracic back: Normal.       Lumbar back: Normal.       No midline spine tenderness, crepitus, or step-off  Neurological:       Alert only to self.  No facial droop.  No obvious focal neuro deficits.  Skin: Skin is warm. No rash noted.  Psychiatric: She has a normal mood and affect.    ED Course  Procedures (including critical care time)  Labs Reviewed - No data to display No results found.   No diagnosis found.   Date: 01/30/2012  Rate: 78  Rhythm: normal sinus rhythm  QRS Axis: left  Intervals: QT prolonged  ST/T Wave abnormalities: nonspecific ST/T changes  Conduction Disutrbances:right bundle branch block  Narrative Interpretation:   Old EKG Reviewed: none available    Results for orders placed during the hospital encounter of 01/30/12  CBC WITH DIFFERENTIAL      Component Value Range   WBC 11.2 (*) 4.0 - 10.5 K/uL   RBC 3.36 (*) 3.87 - 5.11 MIL/uL   Hemoglobin 11.0 (*) 12.0 - 15.0 g/dL   HCT 45.4 (*) 09.8 - 11.9 %   MCV 96.4  78.0 - 100.0 fL   MCH 32.7  26.0 - 34.0 pg   MCHC 34.0  30.0 - 36.0 g/dL   RDW 14.7  82.9 - 56.2 %   Platelets 231  150 - 400 K/uL   Neutrophils Relative 87 (*) 43 - 77 %   Neutro Abs 9.7 (*) 1.7 - 7.7 K/uL   Lymphocytes Relative 9 (*) 12 - 46 %   Lymphs Abs 1.0  0.7 - 4.0 K/uL   Monocytes Relative 4  3 - 12 %   Monocytes Absolute 0.4  0.1 - 1.0 K/uL   Eosinophils Relative 0  0 - 5 %   Eosinophils Absolute 0.1  0.0 - 0.7 K/uL   Basophils Relative 0  0 - 1 %   Basophils Absolute 0.0  0.0 - 0.1 K/uL  POCT I-STAT, CHEM 8      Component Value Range   Sodium 140  135 - 145 mEq/L   Potassium 3.4 (*) 3.5 - 5.1 mEq/L   Chloride 111  96 - 112 mEq/L  BUN 24 (*) 6 - 23 mg/dL   Creatinine, Ser 3.08 (*) 0.50 - 1.10 mg/dL   Glucose, Bld 657 (*) 70 - 99 mg/dL   Calcium, Ion  8.46  9.62 - 1.30 mmol/L   TCO2 21  0 - 100 mmol/L   Hemoglobin 11.2 (*) 12.0 - 15.0 g/dL   HCT 95.2 (*) 84.1 - 32.4 %   Dg Hip Complete Right  01/30/2012  *RADIOLOGY REPORT*  Clinical Data: Hip pain post fall  RIGHT HIP - COMPLETE 2+ VIEW  Comparison: None.  Findings: Three views of the right hip submitted.  There is displaced fracture of the right femoral neck.  IMPRESSION: Displaced fracture of the right femoral neck.   Original Report Authenticated By: Natasha Mead, M.D.    Ct Head Wo Contrast  01/30/2012  *RADIOLOGY REPORT*  Clinical Data:  Demented patient with possible fall.  CT HEAD WITHOUT CONTRAST CT CERVICAL SPINE WITHOUT CONTRAST  Technique:  Multidetector CT imaging of the head and cervical spine was performed following the standard protocol without intravenous contrast.  Multiplanar CT image reconstructions of the cervical spine were also generated.  Comparison:  None.  CT HEAD  Findings: Severe cortical atrophy, mild deep atrophy, and severe cerebellar atrophy.  Moderate to severe changes of small vessel disease of the white matter diffusely.  Physiologic calcifications in the basal ganglia, left greater than right.  No mass lesion.  No midline shift.  No acute hemorrhage or hematoma.  No extra-axial fluid collections.  No evidence of acute infarction.  No skull fracture or other focal osseous abnormality involving the skull.  Opacification of scattered bilateral ethmoid air cells. Remaining visualized paranasal sinuses, bilateral mastoid air cells, and bilateral middle ear cavities well-aerated. Bilateral carotid siphon atherosclerosis.  IMPRESSION:  1.  No acute intracranial abnormality. 2.  Generalized atrophy.  Moderate to severe chronic microvascular ischemic changes of the white matter.  CT CERVICAL SPINE  Findings: No acute fractures identified involving the cervical spine.  Anterior wedge compression deformity of C3 which does not appear acute.  Disc space narrowing endplate hypertrophic  changes at essentially every cervical and upper thoracic level.  No spinal stenosis.  Facet joints intact with severe diffuse degenerative changes. Coronal reformatted images demonstrate an intact craniocervical junction, intact C1-C2 articulation, intact dens, and intact lateral masses.  Combination of facet and uncinate hypertrophy account for multilevel foraminal stenoses including moderate bilateral C3-4, mild right and moderate left C4-5, severe left and moderate right C5-6, severe bilateral C6-7, severe bilateral C7-T1.  Generalized osseous demineralization.  Visualized lung apices clear.  Multiple small nodules within the thyroid gland without evidence of a dominant nodule.  IMPRESSION:  1.  No acute fractures identified involving the cervical spine.  2.  Old wedge compression deformity of the anterior C3 vertebral body. 4.  Multilevel degenerative disc disease, spondylosis, and facet degenerative changes with multilevel foraminal stenoses as detailed above.   Original Report Authenticated By: Hulan Saas, M.D.    Ct Cervical Spine Wo Contrast  01/30/2012  *RADIOLOGY REPORT*  Clinical Data:  Demented patient with possible fall.  CT HEAD WITHOUT CONTRAST CT CERVICAL SPINE WITHOUT CONTRAST  Technique:  Multidetector CT imaging of the head and cervical spine was performed following the standard protocol without intravenous contrast.  Multiplanar CT image reconstructions of the cervical spine were also generated.  Comparison:  None.  CT HEAD  Findings: Severe cortical atrophy, mild deep atrophy, and severe cerebellar atrophy.  Moderate to severe changes of small vessel  disease of the white matter diffusely.  Physiologic calcifications in the basal ganglia, left greater than right.  No mass lesion.  No midline shift.  No acute hemorrhage or hematoma.  No extra-axial fluid collections.  No evidence of acute infarction.  No skull fracture or other focal osseous abnormality involving the skull.  Opacification  of scattered bilateral ethmoid air cells. Remaining visualized paranasal sinuses, bilateral mastoid air cells, and bilateral middle ear cavities well-aerated. Bilateral carotid siphon atherosclerosis.  IMPRESSION:  1.  No acute intracranial abnormality. 2.  Generalized atrophy.  Moderate to severe chronic microvascular ischemic changes of the white matter.  CT CERVICAL SPINE  Findings: No acute fractures identified involving the cervical spine.  Anterior wedge compression deformity of C3 which does not appear acute.  Disc space narrowing endplate hypertrophic changes at essentially every cervical and upper thoracic level.  No spinal stenosis.  Facet joints intact with severe diffuse degenerative changes. Coronal reformatted images demonstrate an intact craniocervical junction, intact C1-C2 articulation, intact dens, and intact lateral masses.  Combination of facet and uncinate hypertrophy account for multilevel foraminal stenoses including moderate bilateral C3-4, mild right and moderate left C4-5, severe left and moderate right C5-6, severe bilateral C6-7, severe bilateral C7-T1.  Generalized osseous demineralization.  Visualized lung apices clear.  Multiple small nodules within the thyroid gland without evidence of a dominant nodule.  IMPRESSION:  1.  No acute fractures identified involving the cervical spine.  2.  Old wedge compression deformity of the anterior C3 vertebral body. 4.  Multilevel degenerative disc disease, spondylosis, and facet degenerative changes with multilevel foraminal stenoses as detailed above.   Original Report Authenticated By: Hulan Saas, M.D.     1. R hip fracture, closed, displaced 2. hypertension   MDM  Suspect right hip pain/injury. Difficult to obtain history due to patient's baseline dementia. Therefore, suspect possible fall. Head and neck CT ordered along with right hip x-ray ordered. Will check basic labs, EKG, troponin, and UA.  3:14 PM Xray of R hip shows  displaced Fx of the R femoral neck.  Pt is neurovascularly intact.  Will consult ortho and also consult pt's PCP Dr. Sharyn Lull for further management.  Pain is controlled.  Care discussed with attending.    3:24 PM I have consulted with orthopedist Dr. Bing Plume, who will see pt for further management.  Will consult medicine for admission.    4:05 PM I have consulted with Dr. Algie Coffer who covered for Dr. Sharyn Lull.  Dr. Algie Coffer agrees to see pt in ER and will admit for further care.  Pt currently in NAD.    BP 188/86  Pulse 71  Temp 98.8 F (37.1 C) (Oral)  Resp 16  SpO2 99%  I have reviewed nursing notes and vital signs. I personally reviewed the imaging tests through PACS system  I reviewed available ER/hospitalization records thought the EMR    Fayrene Helper, [place] 01/30/12 1606

## 2012-01-30 NOTE — ED Notes (Signed)
Patient was found on the bathroom floor. Her right leg is shorten. RLE pulses WNL. Leg is warm to touch. Patient currently says she is not in any pain. Capillary refill intact.

## 2012-01-31 ENCOUNTER — Encounter (HOSPITAL_COMMUNITY): Payer: Self-pay | Admitting: Anesthesiology

## 2012-01-31 ENCOUNTER — Encounter (HOSPITAL_COMMUNITY)
Admission: EM | Disposition: A | Discharge status: Skilled Nursing Facility | Payer: Self-pay | Source: Home / Self Care | Attending: Cardiovascular Disease

## 2012-01-31 ENCOUNTER — Inpatient Hospital Stay (HOSPITAL_COMMUNITY): Payer: Medicare Other

## 2012-01-31 ENCOUNTER — Inpatient Hospital Stay (HOSPITAL_COMMUNITY): Payer: Medicare Other | Admitting: Anesthesiology

## 2012-01-31 HISTORY — PX: HIP ARTHROPLASTY: SHX981

## 2012-01-31 LAB — GRAM STAIN

## 2012-01-31 LAB — BASIC METABOLIC PANEL
GFR calc Af Amer: 50 mL/min — ABNORMAL LOW (ref >90)
GFR calc non Af Amer: 43 mL/min — ABNORMAL LOW (ref >90)
Potassium: 3.3 mEq/L — ABNORMAL LOW (ref 3.5–5.1)
Sodium: 139 mEq/L (ref 135–145)

## 2012-01-31 LAB — CBC
MCHC: 32.9 g/dL (ref 30.0–36.0)
Platelets: 245 10*3/uL (ref 150–400)
RDW: 13 % (ref 11.5–15.5)

## 2012-01-31 LAB — TYPE AND SCREEN

## 2012-01-31 SURGERY — HEMIARTHROPLASTY, HIP, DIRECT ANTERIOR APPROACH, FOR FRACTURE
Anesthesia: General | Site: Hip | Laterality: Right | Wound class: Clean

## 2012-01-31 MED ORDER — FENTANYL CITRATE 0.05 MG/ML IJ SOLN
INTRAMUSCULAR | Status: DC | PRN
  Administered 2012-01-31 (×2): 50 ug via INTRAVENOUS
  Administered 2012-01-31: 100 ug via INTRAVENOUS

## 2012-01-31 MED ORDER — POTASSIUM CHLORIDE IN NACL 20-0.9 MEQ/L-% IV SOLN
INTRAVENOUS | Status: DC
  Administered 2012-01-31 – 2012-02-01 (×2): via INTRAVENOUS
  Administered 2012-02-02: 50 mL/h via INTRAVENOUS
  Administered 2012-02-03: 10:00:00 via INTRAVENOUS
  Administered 2012-02-04: 50 mL/h via INTRAVENOUS
  Filled 2012-01-31 (×8): qty 1000

## 2012-01-31 MED ORDER — OXYCODONE HCL 5 MG PO TABS: 5.0000 mg | ORAL_TABLET | Freq: Four times a day (QID) | ORAL | Status: DC | PRN

## 2012-01-31 MED ORDER — ALBUTEROL SULFATE HFA 108 (90 BASE) MCG/ACT IN AERS: 2.0000 | INHALATION_SPRAY | RESPIRATORY_TRACT | Status: DC | PRN

## 2012-01-31 MED ORDER — LEVOFLOXACIN IN D5W 750 MG/150ML IV SOLN
750.0000 mg | Freq: Once | INTRAVENOUS | Status: AC
  Administered 2012-02-01: 750 mg via INTRAVENOUS
  Filled 2012-01-31 (×2): qty 150

## 2012-01-31 MED ORDER — ACETAMINOPHEN 325 MG PO TABS: 650.0000 mg | ORAL_TABLET | Freq: Four times a day (QID) | ORAL | Status: DC | PRN

## 2012-01-31 MED ORDER — HYDROMORPHONE HCL PF 1 MG/ML IJ SOLN: 0.2500 mg | INTRAMUSCULAR | Status: DC | PRN

## 2012-01-31 MED ORDER — VANCOMYCIN HCL 1000 MG IV SOLR
1000.0000 mg | INTRAVENOUS | Status: AC
  Administered 2012-01-31: 1000 mg via INTRAVENOUS
  Filled 2012-01-31: qty 1000

## 2012-01-31 MED ORDER — POLYETHYLENE GLYCOL 3350 17 G PO PACK
17.0000 g | PACK | Freq: Every day | ORAL | Status: DC | PRN
  Filled 2012-01-31: qty 1

## 2012-01-31 MED ORDER — SODIUM CHLORIDE 0.9 % IR SOLN
Status: DC | PRN
  Administered 2012-01-31: 1000 mL

## 2012-01-31 MED ORDER — LEVOFLOXACIN IN D5W 500 MG/100ML IV SOLN
500.0000 mg | INTRAVENOUS | Status: DC
  Administered 2012-02-02: 500 mg via INTRAVENOUS
  Filled 2012-01-31: qty 100

## 2012-01-31 MED ORDER — ROCURONIUM BROMIDE 100 MG/10ML IV SOLN
INTRAVENOUS | Status: DC | PRN
  Administered 2012-01-31: 10 mg via INTRAVENOUS
  Administered 2012-01-31: 40 mg via INTRAVENOUS

## 2012-01-31 MED ORDER — METOCLOPRAMIDE HCL 5 MG PO TABS
5.0000 mg | ORAL_TABLET | Freq: Three times a day (TID) | ORAL | Status: DC | PRN
  Filled 2012-01-31: qty 2

## 2012-01-31 MED ORDER — ONDANSETRON HCL 4 MG PO TABS: 4.0000 mg | ORAL_TABLET | Freq: Four times a day (QID) | ORAL | Status: DC | PRN

## 2012-01-31 MED ORDER — ONDANSETRON HCL 4 MG/2ML IJ SOLN: 4.0000 mg | Freq: Once | INTRAMUSCULAR | Status: DC | PRN

## 2012-01-31 MED ORDER — PHENYLEPHRINE HCL 10 MG/ML IJ SOLN
10.0000 mg | INTRAVENOUS | Status: DC | PRN
  Administered 2012-01-31: 40 ug/min via INTRAVENOUS

## 2012-01-31 MED ORDER — ACETAMINOPHEN 10 MG/ML IV SOLN
1000.0000 mg | Freq: Four times a day (QID) | INTRAVENOUS | Status: DC
  Administered 2012-01-31 – 2012-02-01 (×2): 1000 mg via INTRAVENOUS
  Filled 2012-01-31 (×6): qty 100

## 2012-01-31 MED ORDER — VANCOMYCIN HCL IN DEXTROSE 1-5 GM/200ML-% IV SOLN
1000.0000 mg | INTRAVENOUS | Status: DC
  Administered 2012-02-02: 1000 mg via INTRAVENOUS
  Filled 2012-01-31: qty 200

## 2012-01-31 MED ORDER — LIDOCAINE HCL (CARDIAC) 20 MG/ML IV SOLN
INTRAVENOUS | Status: DC | PRN
  Administered 2012-01-31: 80 mg via INTRAVENOUS

## 2012-01-31 MED ORDER — NEOSTIGMINE METHYLSULFATE 1 MG/ML IJ SOLN
INTRAMUSCULAR | Status: DC | PRN
  Administered 2012-01-31 (×3): 1 mg via INTRAVENOUS

## 2012-01-31 MED ORDER — AMLODIPINE BESYLATE 5 MG PO TABS
5.0000 mg | ORAL_TABLET | Freq: Every day | ORAL | Status: DC
  Administered 2012-02-02 – 2012-02-04 (×3): 5 mg via ORAL
  Filled 2012-01-31 (×3): qty 1

## 2012-01-31 MED ORDER — GLYCOPYRROLATE 0.2 MG/ML IJ SOLN
INTRAMUSCULAR | Status: DC | PRN
  Administered 2012-01-31 (×3): 0.2 mg via INTRAVENOUS

## 2012-01-31 MED ORDER — ACETAMINOPHEN 650 MG RE SUPP: 650.0000 mg | Freq: Four times a day (QID) | RECTAL | Status: DC | PRN

## 2012-01-31 MED ORDER — METOCLOPRAMIDE HCL 5 MG/ML IJ SOLN
5.0000 mg | Freq: Three times a day (TID) | INTRAMUSCULAR | Status: DC | PRN
  Filled 2012-01-31: qty 2

## 2012-01-31 MED ORDER — LACTATED RINGERS IV SOLN
INTRAVENOUS | Status: DC | PRN
  Administered 2012-01-31 (×2): via INTRAVENOUS

## 2012-01-31 MED ORDER — PROPOFOL 10 MG/ML IV BOLUS
INTRAVENOUS | Status: DC | PRN
  Administered 2012-01-31: 80 mg via INTRAVENOUS

## 2012-01-31 MED ORDER — OXYCODONE HCL 5 MG/5ML PO SOLN: 5.0000 mg | Freq: Once | ORAL | Status: DC | PRN

## 2012-01-31 MED ORDER — MEPERIDINE HCL 25 MG/ML IJ SOLN: 6.2500 mg | INTRAMUSCULAR | Status: DC | PRN

## 2012-01-31 MED ORDER — PHENOL 1.4 % MT LIQD
1.0000 | OROMUCOSAL | Status: DC | PRN
  Filled 2012-01-31: qty 177

## 2012-01-31 MED ORDER — DEXTROSE 5 % IV SOLN
1.0000 g | INTRAVENOUS | Status: AC
  Administered 2012-01-31: 1 g via INTRAVENOUS
  Filled 2012-01-31 (×2): qty 10

## 2012-01-31 MED ORDER — ATROPINE SULFATE 1 MG/ML IJ SOLN
INTRAMUSCULAR | Status: DC | PRN
  Administered 2012-01-31: .5 mg via INTRAVENOUS

## 2012-01-31 MED ORDER — OXYCODONE HCL 5 MG PO TABS: 5.0000 mg | ORAL_TABLET | Freq: Once | ORAL | Status: DC | PRN

## 2012-01-31 MED ORDER — ONDANSETRON HCL 4 MG/2ML IJ SOLN: 4.0000 mg | Freq: Four times a day (QID) | INTRAMUSCULAR | Status: DC | PRN

## 2012-01-31 MED ORDER — ENOXAPARIN SODIUM 30 MG/0.3ML ~~LOC~~ SOLN
30.0000 mg | SUBCUTANEOUS | Status: DC
  Administered 2012-02-01 – 2012-02-04 (×4): 30 mg via SUBCUTANEOUS
  Filled 2012-01-31 (×5): qty 0.3

## 2012-01-31 MED ORDER — MENTHOL 3 MG MT LOZG
1.0000 | LOZENGE | OROMUCOSAL | Status: DC | PRN
  Filled 2012-01-31: qty 9

## 2012-01-31 MED ORDER — BISACODYL 10 MG RE SUPP: 10.0000 mg | Freq: Every day | RECTAL | Status: DC | PRN

## 2012-01-31 SURGICAL SUPPLY — 60 items
BLADE SAW SAG 73X25 THK (BLADE) ×1
BLADE SAW SGTL 73X25 THK (BLADE) ×1 IMPLANT
BRUSH FEMORAL CANAL (MISCELLANEOUS) IMPLANT
BRUSH SCRUB DISP (MISCELLANEOUS) ×4 IMPLANT
CLOTH BEACON ORANGE TIMEOUT ST (SAFETY) ×2 IMPLANT
COVER BACK TABLE 24X17X13 BIG (DRAPES) IMPLANT
COVER SURGICAL LIGHT HANDLE (MISCELLANEOUS) ×4 IMPLANT
DRAPE C-ARMOR (DRAPES) IMPLANT
DRAPE INCISE IOBAN 85X60 (DRAPES) ×4 IMPLANT
DRAPE ORTHO SPLIT 77X108 STRL (DRAPES) ×2
DRAPE SURG ORHT 6 SPLT 77X108 (DRAPES) ×2 IMPLANT
DRAPE U-SHAPE 47X51 STRL (DRAPES) ×2 IMPLANT
DRILL BIT 7/64X5 (BIT) ×2 IMPLANT
DRSG ADAPTIC 3X8 NADH LF (GAUZE/BANDAGES/DRESSINGS) ×2 IMPLANT
DRSG MEPILEX BORDER 4X12 (GAUZE/BANDAGES/DRESSINGS) ×2 IMPLANT
DRSG PAD ABDOMINAL 8X10 ST (GAUZE/BANDAGES/DRESSINGS) ×2 IMPLANT
ELECT BLADE 6.5 EXT (BLADE) IMPLANT
ELECT CAUTERY BLADE 6.4 (BLADE) ×2 IMPLANT
ELECT REM PT RETURN 9FT ADLT (ELECTROSURGICAL) ×2
ELECTRODE REM PT RTRN 9FT ADLT (ELECTROSURGICAL) ×1 IMPLANT
EVACUATOR 1/8 PVC DRAIN (DRAIN) IMPLANT
GLOVE BIO SURGEON STRL SZ7.5 (GLOVE) ×2 IMPLANT
GLOVE BIO SURGEON STRL SZ8 (GLOVE) ×2 IMPLANT
GLOVE BIOGEL PI IND STRL 7.5 (GLOVE) ×1 IMPLANT
GLOVE BIOGEL PI IND STRL 8 (GLOVE) ×1 IMPLANT
GLOVE BIOGEL PI INDICATOR 7.5 (GLOVE) ×1
GLOVE BIOGEL PI INDICATOR 8 (GLOVE) ×1
GOWN PREVENTION PLUS XLARGE (GOWN DISPOSABLE) ×2 IMPLANT
GOWN PREVENTION PLUS XXLARGE (GOWN DISPOSABLE) IMPLANT
GOWN STRL NON-REIN LRG LVL3 (GOWN DISPOSABLE) ×4 IMPLANT
HANDPIECE INTERPULSE COAX TIP (DISPOSABLE)
IMMOBILIZER KNEE 20 (SOFTGOODS)
IMMOBILIZER KNEE 20 THIGH 36 (SOFTGOODS) IMPLANT
IMMOBILIZER KNEE 22 UNIV (SOFTGOODS) IMPLANT
IMMOBILIZER KNEE 24 THIGH 36 (MISCELLANEOUS) IMPLANT
IMMOBILIZER KNEE 24 UNIV (MISCELLANEOUS)
KIT BASIN OR (CUSTOM PROCEDURE TRAY) ×2 IMPLANT
KIT ROOM TURNOVER OR (KITS) ×2 IMPLANT
MANIFOLD NEPTUNE II (INSTRUMENTS) ×2 IMPLANT
NEEDLE 1/2 CIR MAYO (NEEDLE) IMPLANT
NS IRRIG 1000ML POUR BTL (IV SOLUTION) ×2 IMPLANT
PACK TOTAL JOINT (CUSTOM PROCEDURE TRAY) ×2 IMPLANT
PAD ARMBOARD 7.5X6 YLW CONV (MISCELLANEOUS) ×4 IMPLANT
PASSER SUT SWANSON 36MM LOOP (INSTRUMENTS) IMPLANT
SET HNDPC FAN SPRY TIP SCT (DISPOSABLE) IMPLANT
SPONGE GAUZE 4X4 12PLY (GAUZE/BANDAGES/DRESSINGS) ×2 IMPLANT
SPONGE LAP 18X18 X RAY DECT (DISPOSABLE) ×2 IMPLANT
STAPLER VISISTAT 35W (STAPLE) ×2 IMPLANT
SUCTION FRAZIER TIP 10 FR DISP (SUCTIONS) ×2 IMPLANT
SUT FIBERWIRE #2 38 T-5 BLUE (SUTURE) ×4
SUT VIC AB 1 CT1 27 (SUTURE) ×2
SUT VIC AB 1 CT1 27XBRD ANBCTR (SUTURE) ×2 IMPLANT
SUT VIC AB 2-0 CT1 27 (SUTURE) ×2
SUT VIC AB 2-0 CT1 TAPERPNT 27 (SUTURE) ×2 IMPLANT
SUTURE FIBERWR #2 38 T-5 BLUE (SUTURE) ×2 IMPLANT
TOWEL OR 17X24 6PK STRL BLUE (TOWEL DISPOSABLE) ×2 IMPLANT
TOWEL OR 17X26 10 PK STRL BLUE (TOWEL DISPOSABLE) ×4 IMPLANT
TOWER CARTRIDGE SMART MIX (DISPOSABLE) IMPLANT
TRAY FOLEY CATH 14FR (SET/KITS/TRAYS/PACK) IMPLANT
WATER STERILE IRR 1000ML POUR (IV SOLUTION) ×6 IMPLANT

## 2012-01-31 NOTE — Anesthesia Postprocedure Evaluation (Signed)
Anesthesia Post Note  Patient: Annette Cantrell  Procedure(s) Performed: Procedure(s) (LRB): ARTHROPLASTY BIPOLAR HIP (Right)  Anesthesia type: general  Patient location: PACU  Post pain: Pain level controlled  Post assessment: Patient's Cardiovascular Status Stable  Last Vitals:  Filed Vitals:   01/31/12 1545  BP: 192/67  Pulse:   Temp:   Resp:     Post vital signs: Reviewed and stable  Level of consciousness: sedated  Complications: No apparent anesthesia complications

## 2012-01-31 NOTE — Progress Notes (Signed)
1700 telephone report received from PACU rn

## 2012-01-31 NOTE — Anesthesia Preprocedure Evaluation (Addendum)
Anesthesia Evaluation  Patient identified by MRN, date of birth, ID band Patient awake and Patient confused    Reviewed: Allergy & Precautions, H&P , NPO status , Patient's Chart, lab work & pertinent test results, reviewed documented beta blocker date and time   Airway Mallampati: II TM Distance: >3 FB Neck ROM: Full    Dental  (+) Teeth Intact   Pulmonary COPD         Cardiovascular + CAD and + Past MI     Neuro/Psych    GI/Hepatic   Endo/Other    Renal/GU      Musculoskeletal   Abdominal   Peds  Hematology   Anesthesia Other Findings   Reproductive/Obstetrics                           Anesthesia Physical Anesthesia Plan  ASA: III  Anesthesia Plan: General   Post-op Pain Management:    Induction: Intravenous  Airway Management Planned: Oral ETT  Additional Equipment:   Intra-op Plan:   Post-operative Plan: Extubation in OR  Informed Consent: I have reviewed the patients History and Physical, chart, labs and discussed the procedure including the risks, benefits and alternatives for the proposed anesthesia with the patient or authorized representative who has indicated his/her understanding and acceptance.     Plan Discussed with: Surgeon and CRNA  Anesthesia Plan Comments:         Anesthesia Quick Evaluation

## 2012-01-31 NOTE — Consult Note (Signed)
I have seen and examined the patient. I agree with the findings above.  ECHO still not complete.  I discussed with the patient's family the risks and benefits of surgery, including the possibility of infection, nerve injury, vessel injury, wound breakdown, arthritis, symptomatic hardware, DVT/ PE, loss of motion, heart attack, stroke, and need for further surgery among others.  They understood these risks, which are considerable, and wished to proceed.   [name], MD 01/31/2012 10:22 AM

## 2012-01-31 NOTE — Progress Notes (Signed)
Subjective:  Good LV systolic function with mild LVH. No new complaints by patient.  Objective:  Vital Signs in the last 24 hours: Temp:  [98 F (36.7 C)-98.9 F (37.2 C)] 98.2 F (36.8 C) (01/26 1014) Pulse Rate:  [70-79] 79  (01/26 1014) Cardiac Rhythm:  [-] Normal sinus rhythm (01/26 0730) Resp:  [14-18] 18  (01/26 0441) BP: (135-191)/(68-86) 135/78 mmHg (01/26 1014) SpO2:  [93 %-100 %] 96 % (01/26 1014) Weight:  [43.6 kg (96 lb 1.9 oz)-43.7 kg (96 lb 5.5 oz)] 43.6 kg (96 lb 1.9 oz) (01/26 0441)  Physical Exam: BP Readings from Last 1 Encounters:  01/31/12 135/78    Wt Readings from Last 1 Encounters:  01/31/12 43.6 kg (96 lb 1.9 oz)    Weight change:   HEENT: [place]/AT, Eyes-Brown, PERL, EOMI, Conjunctiva-Pale pink, Sclera-Non-icteric Neck: No JVD, No bruit, Trachea midline. Lungs:  Clear, Bilateral. Cardiac:  Regular rhythm, normal S1 and S2, no S3.  Abdomen:  Soft, non-tender. Extremities:  No edema present. No cyanosis. No clubbing. Right hip tenderness continues. CNS: AxOx3, Cranial nerves grossly intact.. Right handed. Skin: Warm and dry.   Intake/Output from previous day: 01/25 0701 - 01/26 0700 In: 120 [P.O.:120] Out: 650 [Urine:650]    Lab Results: BMET    Component Value Date/Time   NA 139 01/31/2012 0558   K 3.3* 01/31/2012 0558   CL 105 01/31/2012 0558   CO2 22 01/31/2012 0558   GLUCOSE 103* 01/31/2012 0558   BUN 21 01/31/2012 0558   CREATININE 1.11* 01/31/2012 0558   CALCIUM 9.9 01/31/2012 0558   GFRNONAA 43* 01/31/2012 0558   GFRAA 50* 01/31/2012 0558   CBC    Component Value Date/Time   WBC 9.0 01/31/2012 0558   RBC 3.39* 01/31/2012 0558   HGB 10.7* 01/31/2012 0558   HCT 32.5* 01/31/2012 0558   PLT 245 01/31/2012 0558   MCV 95.9 01/31/2012 0558   MCH 31.6 01/31/2012 0558   MCHC 32.9 01/31/2012 0558   RDW 13.0 01/31/2012 0558   LYMPHSABS 1.0 01/30/2012 1417   MONOABS 0.4 01/30/2012 1417   EOSABS 0.1 01/30/2012 1417   BASOSABS 0.0 01/30/2012 1417   CARDIAC  ENZYMES No results found for this basename: CKTOTAL, CKMB, CKMBINDEX, TROPONINI    Scheduled Meds:   . amLODipine  5 mg Oral Daily  . atorvastatin  10 mg Oral q1800  .  ceFAZolin (ANCEF) IV  2 g Intravenous Once  . docusate sodium  100 mg Oral BID  . memantine  5 mg Oral Daily  . metoprolol succinate  25 mg Oral Daily  . sertraline  25 mg Oral Daily  . sodium chloride  3 mL Intravenous Q12H  . valACYclovir  1,000 mg Oral Q24H   Continuous Infusions:   . dextrose 5 % and 0.45 % NaCl with KCl 20 mEq/L 75 mL/hr at 01/30/12 2115   PRN Meds:.acetaminophen, albuterol, alum & mag hydroxide-simeth, HYDROcodone-acetaminophen, HYDROmorphone (DILAUDID) injection, ondansetron (ZOFRAN) IV, ondansetron  Assessment/Plan:  Patient Active Hospital Problem List:  Right femoral neck fracture  Dementia  Stroke  Dyslipidemia  May undergo surgery as planned.   LOS: 1 day    Orpah Cobb  MD  01/31/2012, 11:55 AM

## 2012-01-31 NOTE — Brief Op Note (Signed)
01/30/2012 - 01/31/2012  2:55 PM  PATIENT:  Annette Cantrell  79 y.o. female  PRE-OPERATIVE DIAGNOSIS:  Fractured Right Hip, sebaceous cyst  POST-OPERATIVE DIAGNOSIS:  fractured right hip, sebaceous cyst  PROCEDURE:  Procedure(s) (LRB) with comments: ARTHROPLASTY BIPOLAR HIP (Right) with DePuy Summit basic, #5 stem, standard neck, 42mm head unipolar 2. I&D sebaceous cyst right lower back (PSIS)  SURGEON:  Surgeon(s) and Role:    * [name], MD - Primary  PHYSICIAN ASSISTANT: Montez Morita, [facility]  ANESTHESIA:   general  EBL:  Total I/O In: 1000 [I.V.:1000] Out: 300 [Urine:300]  BLOOD ADMINISTERED:none  DRAINS: none   LOCAL MEDICATIONS USED:  NONE  SPECIMEN:  Source of Specimen:  sebaceous cyst  DISPOSITION OF SPECIMEN:  micro  COUNTS:  YES  TOURNIQUET:  * No tourniquets in log *  DICTATION: .Other Dictation: Dictation Number [id number]   PLAN OF CARE: Admit to inpatient   PATIENT DISPOSITION:  PACU - hemodynamically stable.   Delay start of Pharmacological VTE agent (>24hrs) due to surgical blood loss or risk of bleeding: no

## 2012-01-31 NOTE — ED Provider Notes (Signed)
Medical screening examination/treatment/procedure(s) were conducted as a shared visit with non-physician practitioner(s) and myself.  I personally evaluated the patient during the encounter.  Pt examined.  Note shortening of right leg with hip discomfort with any attempt to manipulate the hip.  Imaging demonstrated a rt femoral neck fracture.  Pt admitted to internal medicine for routine screening evaluation prior to evaluation and treatment by orthopedic surgery.  Tobin Chad, MD 01/31/12 [phone]

## 2012-01-31 NOTE — Progress Notes (Signed)
1100 echo done . At bedside 1200 dr Algie Coffer read echo and ok pt to haveor today   spoken with Dr. Carola Frost will notify  OR 1230 to OR with family  Member, POA to sign consent

## 2012-01-31 NOTE — Progress Notes (Signed)
1730 transferred in from PACU via with nurse and monitor  PT fully awake alert and coherent moving arms . With abduction pillow in between lower  Extremities . Moving toes on command , pulses palpable , skin warm and dry to touch with good sensation . dressing R hip dry and intact . Ice pack in place . Marland Kitchen Incentive spirometry done effectively . SCD applied to lower extremity bilateraLLY AS ORDERED. KEPT COMFORTABLE IN BED . SAFETY PRECAUTION  Observed . Pulse ox done  ciontinously .993-06 % on 2l/m [place]. Spoken with son and daughter Hendricks Limes . Updated with general status

## 2012-01-31 NOTE — Anesthesia Procedure Notes (Signed)
Procedure Name: Intubation Date/Time: 01/31/2012 1:19 PM Performed by: Alanda Amass A Pre-anesthesia Checklist: Patient identified, Timeout performed, Emergency Drugs available, Suction available and Patient being monitored Patient Re-evaluated:Patient Re-evaluated prior to inductionOxygen Delivery Method: Circle system utilized Preoxygenation: Pre-oxygenation with 100% oxygen Intubation Type: IV induction Ventilation: Mask ventilation without difficulty Laryngoscope Size: Mac and 3 Grade View: Grade IV Tube type: Oral Tube size: 7.5 mm Number of attempts: 2 Airway Equipment and Method: Stylet and Video-laryngoscopy Placement Confirmation: ETT inserted through vocal cords under direct vision,  breath sounds checked- equal and bilateral and positive ETCO2 Secured at: 21 cm Tube secured with: Tape Dental Injury: Teeth and Oropharynx as per pre-operative assessment  Comments: Initial laryngoscopy with mac 3, unable to visualize necessary airway structures.  @nd  laryngoscopy with video glide scope, excellent view, 7.5 ETT passed with ease.

## 2012-01-31 NOTE — Transfer of Care (Signed)
Immediate Anesthesia Transfer of Care Note  Patient: Annette Cantrell  Procedure(s) Performed: Procedure(s) (LRB) with comments: ARTHROPLASTY BIPOLAR HIP (Right)  Patient Location: PACU  Anesthesia Type:General  Level of Consciousness: sedated  Airway & Oxygen Therapy: Patient Spontanous Breathing, Patient connected to face mask oxygen and oral and nasal airways  Post-op Assessment: Report given to PACU RN and Post -op Vital signs reviewed and stable  Post vital signs: Reviewed and stable  Complications: No apparent anesthesia complications

## 2012-01-31 NOTE — Progress Notes (Signed)
ANTIBIOTIC CONSULT NOTE - INITIAL  Pharmacy Consult for Vancomycin and Levaquin Indication: R buttock / gluteal absceses  No Known Allergies  Patient Measurements: Height: 5\' 1"  (154.9 cm) Weight: 96 lb 1.9 oz (43.6 kg) IBW/kg (Calculated) : 47.8   Vital Signs: Temp: 97.5 F (36.4 C) (01/26 1715) Temp src: Oral (01/26 1014) BP: 129/82 mmHg (01/26 1801) Pulse Rate: 76  (01/26 1801) Intake/Output from previous day: 01/25 0701 - 01/26 0700 In: 120 [P.O.:120] Out: 650 [Urine:650] Intake/Output from this shift: Total I/O In: 1700 [I.V.:1700] Out: 790 [Urine:665; Blood:125]  Labs:  [facility] 01/31/12 0558 01/30/12 1456 01/30/12 1417  WBC 9.0 -- 11.2*  HGB 10.7* 11.2* 11.0*  PLT 245 -- 231  LABCREA -- -- --  CREATININE 1.11* 1.30* --   Estimated Creatinine Clearance: 24.6 ml/min (by C-G formula based on Cr of 1.11). No results found for this basename: VANCOTROUGH:2,VANCOPEAK:2,VANCORANDOM:2,GENTTROUGH:2,GENTPEAK:2,GENTRANDOM:2,TOBRATROUGH:2,TOBRAPEAK:2,TOBRARND:2,AMIKACINPEAK:2,AMIKACINTROU:2,AMIKACIN:2, in the last 72 hours   Microbiology: Recent Results (from the past 720 hour(s))  CULTURE, ROUTINE-ABSCESS     Status: Normal   Collection Time   01/25/12  3:55 PM      Component Value Range Status Comment   Specimen Description ABSCESS BACK RIGHT   Final    Special Requests NONE   Final    Gram Stain     Final    Value: RARE WBC PRESENT, PREDOMINANTLY PMN     RARE SQUAMOUS EPITHELIAL CELLS PRESENT     MODERATE GRAM POSITIVE COCCI     IN PAIRS   Culture     Final    Value: MULTIPLE ORGANISMS PRESENT, NONE PREDOMINANT     Note: NO STAPHYLOCOCCUS AUREUS ISOLATED NO GROUP A STREP (S.PYOGENES) ISOLATED   Report Status 01/28/2012 FINAL   Final   GRAM STAIN     Status: Normal   Collection Time   01/31/12  2:15 PM      Component Value Range Status Comment   Specimen Description ABSCESS RIGHT BACK   Final    Special Requests NONE   Final    Gram Stain     Final    Value:  RARE WBC PRESENT, PREDOMINANTLY MONONUCLEAR     ABUNDANT GRAM POSITIVE COCCI IN PAIRS     CALLED TO ALLEN,J RN 01/31/12 1515 WOOTEN,K   Report Status 01/31/2012 FINAL   Final     Medical History: Past Medical History  Diagnosis Date  . Dementia   . Stroke   . High cholesterol   . Heart disease, unspecified   . LUNG NODULE 03/30/2008    Qualifier: Diagnosis of  By: [name] MD, [name]   . COPD 03/30/2008    Qualifier: Diagnosis of  By: [name] MD, [name]   . Infected sebaceous cyst 01/25/2012  . Herpes zoster 01/30/2012  . History of positive PPD, untreated     Medications:  Prescriptions prior to admission  Medication Sig Dispense Refill  . amLODipine (NORVASC) 5 MG tablet Take 5 mg by mouth daily.      Marland Kitchen aspirin EC 81 MG tablet Take 81 mg by mouth daily.      Marland Kitchen atorvastatin (LIPITOR) 10 MG tablet Take 10 mg by mouth daily.      . memantine (NAMENDA) 5 MG tablet Take 5 mg by mouth daily.      . metoprolol succinate (TOPROL-XL) 25 MG 24 hr tablet Take 25 mg by mouth daily.      . sertraline (ZOLOFT) 25 MG tablet Take 25 mg by mouth daily.      Marland Kitchen  valACYclovir (VALTREX) 1000 MG tablet Take 1 tablet (1,000 mg total) by mouth 3 (three) times daily.  21 tablet  0    Rec'd Rocephin 1gm and Vancomycin 1gm prior to surgery ~ 1345 today  Assessment: 79 yo F admitted 1/25 after a fall and R hip pain.  Pt was taken to the OR 1/26 for hip repair.  Pt also has an infected sebaceous cyst on her backside which was debrided in the OR today.  To begin empiric antibiotic coverage with Vanc and Levaquin.  Goal of Therapy:  Vancomycin trough level 10-15 mcg/ml Eradication of infection  Plan:  Vancomycin 1gm IV q48 hours (next dose 1/28 at 1400). Levaquin 750 mg IV x 1, followed by Levaquin 500 mg IV q48h. Follow clinical progress, renal function, and culture data. Check Vancomycin level as indicated.  [employer], Pharm.D., BCPS Clinical Pharmacist Pager [phone] 01/31/2012 6:28  PM

## 2012-02-01 ENCOUNTER — Encounter (HOSPITAL_COMMUNITY): Payer: Self-pay | Admitting: Orthopedic Surgery

## 2012-02-01 DIAGNOSIS — F0390 Unspecified dementia without behavioral disturbance: Secondary | ICD-10-CM | POA: Diagnosis present

## 2012-02-01 DIAGNOSIS — E7800 Pure hypercholesterolemia, unspecified: Secondary | ICD-10-CM | POA: Diagnosis present

## 2012-02-01 DIAGNOSIS — I519 Heart disease, unspecified: Secondary | ICD-10-CM | POA: Diagnosis present

## 2012-02-01 LAB — BASIC METABOLIC PANEL
CO2: 15 mEq/L — ABNORMAL LOW (ref 19–32)
Chloride: 107 mEq/L (ref 96–112)
Creatinine, Ser: 1.52 mg/dL — ABNORMAL HIGH (ref 0.50–1.10)
GFR calc Af Amer: 34 mL/min — ABNORMAL LOW (ref >90)
Potassium: 4.3 mEq/L (ref 3.5–5.1)
Sodium: 136 mEq/L (ref 135–145)

## 2012-02-01 LAB — CBC
MCV: 97.3 fL (ref 78.0–100.0)
Platelets: 178 10*3/uL (ref 150–400)
RBC: 2.56 MIL/uL — ABNORMAL LOW (ref 3.87–5.11)
RDW: 13.2 % (ref 11.5–15.5)
WBC: 8 10*3/uL (ref 4.0–10.5)

## 2012-02-01 LAB — VITAMIN D 25 HYDROXY (VIT D DEFICIENCY, FRACTURES): Vit D, 25-Hydroxy: 13 ng/mL — ABNORMAL LOW (ref 30–89)

## 2012-02-01 MED ORDER — HYDROCODONE-ACETAMINOPHEN 5-325 MG PO TABS
1.0000 | ORAL_TABLET | Freq: Four times a day (QID) | ORAL | Status: DC | PRN
  Administered 2012-02-01 – 2012-02-04 (×5): 1 via ORAL
  Filled 2012-02-01 (×5): qty 1

## 2012-02-01 MED ORDER — ACETAMINOPHEN 650 MG RE SUPP: 650.0000 mg | Freq: Four times a day (QID) | RECTAL | Status: DC | PRN

## 2012-02-01 MED ORDER — ACETAMINOPHEN 325 MG PO TABS: 325.0000 mg | ORAL_TABLET | Freq: Four times a day (QID) | ORAL | Status: DC | PRN

## 2012-02-01 MED ORDER — HALOPERIDOL LACTATE 5 MG/ML IJ SOLN
0.5000 mg | Freq: Three times a day (TID) | INTRAMUSCULAR | Status: DC | PRN
  Administered 2012-02-01: 0.5 mg via INTRAVENOUS
  Filled 2012-02-01 (×2): qty 0.1

## 2012-02-01 MED ORDER — VITAMIN D3 25 MCG (1000 UNIT) PO TABS
1000.0000 [IU] | ORAL_TABLET | Freq: Two times a day (BID) | ORAL | Status: DC
  Administered 2012-02-01 (×2): 1000 [IU] via ORAL
  Filled 2012-02-01 (×4): qty 1

## 2012-02-01 MED ORDER — CALCIUM CITRATE 950 (200 CA) MG PO TABS
400.0000 mg | ORAL_TABLET | Freq: Every day | ORAL | Status: DC
  Administered 2012-02-01 – 2012-02-04 (×4): 400 mg via ORAL
  Filled 2012-02-01 (×4): qty 2

## 2012-02-01 MED ORDER — ENSURE COMPLETE PO LIQD
237.0000 mL | Freq: Two times a day (BID) | ORAL | Status: DC
  Administered 2012-02-02 – 2012-02-04 (×4): 237 mL via ORAL

## 2012-02-01 NOTE — Op Note (Signed)
[name], Cantrell            ACCOUNT NO.:  [id number]  MEDICAL RECORD NO.:  [id number]  LOCATION:  4705                         FACILITY:  [facility]  PHYSICIAN:  Doralee Albino. Carola Frost, M.D. DATE OF BIRTH:  June 29, 1922  DATE OF PROCEDURE:  01/31/2012 DATE OF DISCHARGE:                              OPERATIVE REPORT   PREOPERATIVE DIAGNOSES: 1. Right hip displaced femoral neck fracture. 2. Abscess, sebaceous cyst, right lower back over the posterior superior iliac     spine.  POSTOPERATIVE DIAGNOSES: 1. Right hip displaced femoral neck fracture. 2. Abscess, sebaceous cyst, right lower back over the posterior superior iliac     spine.  PROCEDURES: 1. Right hip hemiarthroplasty using a DePuy diffuse Summit basic #5     press-fit stem standard neck, 42-mm unipolar head. 2. I and D, sebaceous cyst, right lower back.  SURGEON:  Doralee Albino. Carola Frost, M.D.  ASSISTANT:  Mearl Latin, PA-C.  ANESTHESIA:  General.  COMPLICATIONS:  None/1000 mL crystalloid/300 mL urinary output.  BLOOD LOSS:  Less than 100 mL.  SPECIMENS:  Two anaerobic, aerobic sent from the sebaceous cyst to micro.  DISPOSITION:  PACU.  CONDITION:  Stable.  BRIEF SUMMARY OF INDICATION FOR PROCEDURE:  Annette Cantrell is an 79-year-old female with multiple medical problems.  However, she is highly functional with no assisted devices either in the community or in her home.  She sustained a fall resulting in displaced femoral neck fracture.  She has also recently seen in the ED about 5 days ago for sebaceous cyst on her back.  The patient and caretaker did not recall the precise location of this cyst, another family member cared for her at that time.  I did examine her back preoperatively did not identify a cyst lesion.  I did discuss with the family very frankly the risk of surgery both in the near and long-term with heart attack, stroke, blood loss requiring transfusion, infection, instability, nerve injury,  vessel injury, leg length inequality, DVT, PE and many others being discussed. The family did understand this including the power of attorney which the granddaughter and they wish to proceed.  BRIEF DESCRIPTION OF PROCEDURE:  Annette Cantrell was taken to the operating room where general anesthesia was induced.  She was then positioned on her side and as we did so, she began spontaneous drainage from a previously well looking area directly over posterior-superior iliac spine.  There was no ulceration.  At that point, a prep and drape was done of the area.  A formal incision made with a 10 blade excising both sides of the capsule.  Curette was used to remove all sebaceous material within it.  There was gross purulence met the initial exposure and anaerobic, aerobic cultures were sent for evaluation and micro. Following aggressive curettage and removal with a rongeur, the area was irrigated thoroughly and then packed lightly with iodoform gauze.  The area was cleaned and dressed free and out of the field.  A standard prep and drape was then performed.  The patient did receive both vancomycin and ceftriaxone after the cultures were obtained from the initial I and D.  A standard Kocher-Langenbeck approach was then made through a 10-cm  incision dividing the tensor in line with the incision identifying and releasing the piriformis.  The short rotators were confluent with the capsule.  This was very careful as to T capsule only under direct visualization.  The head was removed and sized to a 42.  The proximal femur was then prepared with use of box cutter. Sequential reaming up to 5, sequential broaching up to 5 achieving outstanding fit, fill, and rotational control.  The trialed with a standard and a 42-mm head.  Resulted in an outstanding stability with good extension and stability again in full extension, external rotation as well as AD duction, flexion, and internal rotation.  The trials  were removed.  The actual component placed and once again found to be stable over the parameters above.  Standard layered closure was then performed, and the piriformis repaired back directly to bone using #2 FiberWire. The patient was then taken to the PACU in stable condition after again standard layered closure.  The dressings for the 2 wounds were separated to avoid any cross contamination.  Montez Morita, PA-C did assist me throughout the procedure and as noted was absolutely necessary for the safe and effective completion of the case as he was required to control and assist with exposure of the femur as well as relocation of the trial and actual components.  PROGNOSIS:  Annette Cantrell is at increased risk for perioperative complications given her 79 age and multiple comorbidities.  This is further augmented by her nearby sebaceous cyst which was felt to be treated but found to continue be a problem.  She will undergo daily dressing changes there and will remain on antibiotics until complete resolution of that infection has occurred.  The antibiotics will be tapered according to the culture results.     Doralee Albino. Carola Frost, M.D.     MHH/MEDQ  D:  01/31/2012  T:  02/01/2012  Job:  [id number]

## 2012-02-01 NOTE — Progress Notes (Signed)
Patient evaluated for community based chronic disease management services with [facility] Care Management Program as a benefit of patient's [employer].  Spoke with patient and daughter Britta Mccreedy at bedside to explain [facility] Care Management services.  Verified PCP as Dr Rinaldo Cloud with daughter.  Updated EPIC system with unit staff,  however patient is not eligible for [facility] services because Dr Sharyn Lull is not [facility] affiliated.  Made daughter aware of ineligibility.  Of note, [facility] Care Management services does not replace or interfere with any services that are arranged by inpatient case management or social work.  For additional questions or referrals please contact Anibal Henderson BSN RN [facility] Liaison at [phone].

## 2012-02-01 NOTE — Progress Notes (Signed)
Utilization Review Completed.   Felina Tello, RN, BSN Nurse Case Manager  [phone]  

## 2012-02-01 NOTE — Progress Notes (Signed)
INITIAL NUTRITION ASSESSMENT  DOCUMENTATION CODES Per approved criteria  -Not Applicable   INTERVENTION:  Ensure Complete twice daily (350 kcals, 13 gm protein per 8 fl oz bottle) RD to follow for nutrition care plan  NUTRITION DIAGNOSIS: Inadequate oral intake related decreased appetite as evidenced by PO intake 0-30%  Goal: Oral intake with meals & supplements to meet >/= 90% of estimated nutrition needs  Monitor:  PO & supplemental intake, weight, labs, I/O's  Reason for Assessment: Consult ---> Hip Fracture Protocol  79 y.o. female  Admitting Dx: Fracture of femoral neck, right, closed  ASSESSMENT: Patient admitted after her daughter noticed the patient was limping and complaining of pain to her right hip ---> workup in ED showed R femoral neck fracture.  Patient s/p procedure 1/26: ARTHROPLASTY BIPOLAR HIP (Right) I & D SEBACEOUS CYST LOWER BACK (Right)  RD spoke with patient & family members at bedside; reports she ate "pretty good" today; PO intake 0-30% per flowsheet records; no reported weight loss; amenable to chocolate Ensure Complete supplements ---> RD to order.  Height: Ht Readings from Last 1 Encounters:  01/30/12 5\' 1"  (1.549 m)    Weight: Wt Readings from Last 1 Encounters:  02/01/12 105 lb 9.6 oz (47.9 kg)    Ideal Body Weight: 105 lb  % Ideal Body Weight: 100%  Wt Readings from Last 10 Encounters:  02/01/12 105 lb 9.6 oz (47.9 kg)  02/01/12 105 lb 9.6 oz (47.9 kg)  12/23/09 100 lb 4 oz (45.473 kg)  07/16/09 103 lb (46.72 kg)  06/27/09 105 lb (47.628 kg)  03/14/09 111 lb 4 oz (50.463 kg)  02/25/09 107 lb 8 oz (48.762 kg)  03/30/08 95 lb 2.1 oz (43.151 kg)    Usual Body Weight: 100 lb  % Usual Body Weight: 105%  BMI:  Body mass index is 19.95 kg/(m^2).  Estimated Nutritional Needs: Kcal: 1400-1600 Protein: 60-70 gm Fluid: > 1.5 L  Skin: R hip surgical incision   Diet Order: Cardiac  EDUCATION NEEDS: -No education needs  identified at this time   Intake/Output Summary (Last 24 hours) at 02/01/12 1424 Last data filed at 02/01/12 1310  Gross per 24 hour  Intake   2730 ml  Output    640 ml  Net   2090 ml    Labs:   Lab 02/01/12 0705 01/31/12 0558 01/30/12 1456  NA 136 139 140  K 4.3 3.3* 3.4*  CL 107 105 111  CO2 15* 22 --  BUN 23 21 24*  CREATININE 1.52* 1.11* 1.30*  CALCIUM 8.7 9.9 --  MG -- -- --  PHOS -- -- --  GLUCOSE 80 103* 130*    Scheduled Meds:   . amLODipine  5 mg Oral Daily  . atorvastatin  10 mg Oral q1800  . calcium citrate  400 mg of elemental calcium Oral Daily  . cholecalciferol  1,000 Units Oral BID  . docusate sodium  100 mg Oral BID  . enoxaparin (LOVENOX) injection  30 mg Subcutaneous Q24H  . levofloxacin (LEVAQUIN) IV  500 mg Intravenous Q48H  . memantine  5 mg Oral Daily  . metoprolol succinate  25 mg Oral Daily  . sertraline  25 mg Oral Daily  . sodium chloride  3 mL Intravenous Q12H  . valACYclovir  1,000 mg Oral Q24H  . vancomycin  1,000 mg Intravenous Q48H    Continuous Infusions:   . 0.9 % NaCl with KCl 20 mEq / L 50 mL/hr at 01/31/12 2212  Past Medical History  Diagnosis Date  . Dementia   . Stroke   . High cholesterol   . Heart disease, unspecified   . LUNG NODULE 03/30/2008    Qualifier: Diagnosis of  By: [name] MD, [name]   . COPD 03/30/2008    Qualifier: Diagnosis of  By: [name] MD, [name]   . Infected sebaceous cyst 01/25/2012  . Herpes zoster 01/30/2012  . History of positive PPD, untreated     Past Surgical History  Procedure Date  . Hip arthroplasty 01/31/2012    Procedure: ARTHROPLASTY BIPOLAR HIP;  Surgeon: [name], MD;  Location: [facility] OR;  Service: Orthopedics;  Laterality: Right;    Maureen Chatters, RD, LDN Pager #: [phone] After-Hours Pager #: [phone]

## 2012-02-01 NOTE — Evaluation (Signed)
Physical Therapy Evaluation Patient Details Name: Annette Cantrell MRN: [id number] DOB: [birth year] Today's Date: 02/01/2012 Time: 0865-7846 PT Time Calculation (min): 31 min  PT Assessment / Plan / Recommendation Clinical Impression  Pt admitted s/p fall at home with femur fx and s/p RLE hemiarthroplasty. Pt with cognitive impairments limiting her awareness of situation and unable to recall precautions. Dgtr present throughout and question dgtr's capacity to care for pt as she demonstrates lack of understanding of precautions, mobility restrictions and level of care needed given hemiarthoplasty despite education and reinforcement throughout session. Dgtr however continues to state she wants to take pt home but is open to ST-SNF if dgtr unable to meet transfer goals with pt prior to discharge. Per dgtr she didn't even call 911 the aide did because dgtr was unaware something was wrong with pt. Dgtr and pt educated throughout for precautions and roll of therapy. Will follow acutely to maximize mobilty, transfers, gait and function to increase safety and decrease burden of care. Pt required constant reorientation and redirection throughout eval for participation and safety.    PT Assessment  Patient needs continued PT services    Follow Up Recommendations  SNF;Supervision/Assistance - 24 hour    Does the patient have the potential to tolerate intense rehabilitation      Barriers to Discharge Decreased caregiver support      Equipment Recommendations  Rolling walker with 5" wheels    Recommendations for Other Services     Frequency Min 5X/week    Precautions / Restrictions Precautions Precautions: Posterior Hip;Fall Precaution Booklet Issued: Yes (comment) Restrictions Weight Bearing Restrictions: Yes RLE Weight Bearing: Weight bearing as tolerated   Pertinent Vitals/Pain Pt initially stating pain in right chest, unable to rate During transfer stated some pain at surgical site, unable  to rate and no need for intervention at rest premedicated      Mobility  Bed Mobility Bed Mobility: Supine to Sit;Sitting - Scoot to Edge of Bed Supine to Sit: 3: Mod assist;HOB elevated (HOB 30degrees) Sitting - Scoot to Edge of Bed: 3: Mod assist Details for Bed Mobility Assistance: pt able to bring trunk off surface unassisted with help to pivot lower body and maintain hip precautions throughout transfer and use of pad to scoot hips to EOB, increased time for transfer due to impaired cognition Transfers Transfers: Sit to Stand;Stand to Sit;Stand Pivot Transfers Sit to Stand: 1: +2 Total assist;From bed Sit to Stand: Patient Percentage: 60% Stand to Sit: 1: +2 Total assist;To chair/3-in-1 Stand to Sit: Patient Percentage: 60% Stand Pivot Transfers: 1: +2 Total assist Stand Pivot Transfers: Patient Percentage: 60% Details for Transfer Assistance: pt initially attempting to stand on her own but after realization that difficult allowed PT and tech to step in and assist with transfer. Hand over hand cueing for placement, RLE blocked to maintain precautions throughout with support at axilla and pelvis to perform transfers, did not attempt RW use due to cognition. Ambulation/Gait Ambulation/Gait Assistance: Not tested (comment)    Shoulder Instructions     Exercises     PT Diagnosis: Difficulty walking;Acute pain  PT Problem List: Decreased strength;Decreased cognition;Decreased activity tolerance;Decreased mobility;Decreased knowledge of precautions;Decreased safety awareness;Decreased knowledge of use of DME;Pain PT Treatment Interventions: DME instruction;Gait training;Functional mobility training;Therapeutic activities;Therapeutic exercise;Patient/family education   PT Goals Acute Rehab PT Goals PT Goal Formulation: With patient/family for return home pt must meet all goals with assist of dgtr only Time For Goal Achievement: 02/15/12 Potential to Achieve Goals: Fair Pt will go  Supine/Side to Sit: with min assist;with HOB 0 degrees PT Goal: Supine/Side to Sit - Progress: Goal set today Pt will go Sit to Supine/Side: with min assist;with HOB 0 degrees PT Goal: Sit to Supine/Side - Progress: Goal set today Pt will go Sit to Stand: with min assist;from elevated surface PT Goal: Sit to Stand - Progress: Goal set today Pt will go Stand to Sit: with min assist PT Goal: Stand to Sit - Progress: Goal set today Pt will Transfer Bed to Chair/Chair to Bed: with mod assist PT Transfer Goal: Bed to Chair/Chair to Bed - Progress: Goal set today Pt will Ambulate: 1 - 15 feet;with mod assist;with rolling walker PT Goal: Ambulate - Progress: Goal set today  Visit Information  Last PT Received On: 02/01/12 Assistance Needed: +2 (safety)    Subjective Data  Subjective: "why are these people here?"  "what's going on?" Patient Stated Goal: pt unable to state, dgtr wants to take pt home   Prior Functioning  Home Living Lives With: Daughter Available Help at Discharge: Personal care attendant;Available 24 hours/day;Family Type of Home: House Home Layout: One level Prior Function Level of Independence: Needs assistance Needs Assistance: Bathing;Dressing;Light Housekeeping;Meal Prep Bath: Moderate Dressing: Moderate Meal Prep: Total Light Housekeeping: Total Driving: No Comments: dgtr providing PLOF and inconsistent with answers initially stating pt does everything for herself then stated she has an aide for 3hrs/ 7days a week who helps with bathing and dressing and cooking. Dgtr states she can feed herself and normally get up on her own but at times needs help.  Communication Communication: No difficulties    Cognition  Overall Cognitive Status: History of cognitive impairments - at baseline Arousal/Alertness: Awake/alert Orientation Level: Disoriented to;Place;Time;Situation Behavior During Session: Other (comment) (confused and perseverating on "what happened" "why am  I here)    Extremity/Trunk Assessment Right Upper Extremity Assessment RUE ROM/Strength/Tone: [facility] for tasks assessed Left Upper Extremity Assessment LUE ROM/Strength/Tone: [facility] for tasks assessed Right Lower Extremity Assessment RLE ROM/Strength/Tone: Deficits;Due to pain;Unable to fully assess;Due to impaired cognition RLE ROM/Strength/Tone Deficits: pt with grimace with elevation off pillow but minor responses to pain with mobility Left Lower Extremity Assessment LLE ROM/Strength/Tone: [facility] for tasks assessed;Unable to fully assess;Due to impaired cognition Trunk Assessment Trunk Assessment: Normal   Balance    End of Session PT - End of Session Activity Tolerance: Other (comment) (limited by cognition and pt ability to follow commands) Patient left: in chair;with call bell/phone within reach;with family/visitor present Nurse Communication: Mobility status  GP     Delorse Lek 02/01/2012, 1:28 PM  Delaney Meigs, PT [phone]

## 2012-02-01 NOTE — Progress Notes (Signed)
Orthopedic Tech Progress Note Patient Details:  Annette Cantrell 1922/11/22 [id number]  Patient ID: Annette Cantrell, female   DOB: 1922/07/28, 79 y.o.   MRN: [id number] Trapeze bar patient helper  Nikki Dom 02/01/2012, 1:18 PM

## 2012-02-01 NOTE — Progress Notes (Signed)
Orthopaedic Trauma Service (OTS)  Subjective: 1 Day Post-Op Procedure(s) (LRB): ARTHROPLASTY BIPOLAR HIP (Right)  Doing well Granddaughter at bedside No acute issues overnight Pt eating breakfast  No complaints Denies CP, no SOB No n/v No dizziness or lightheadedness No h/a  Objective: Current Vitals Blood pressure 119/61, pulse 58, temperature 97.6 F (36.4 C), temperature source Oral, resp. rate 16, height 5\' 1"  (1.549 m), weight 47.9 kg (105 lb 9.6 oz), SpO2 99.00%. Vital signs in last 24 hours: Temp:  [96.8 F (36 C)-98.2 F (36.8 C)] 97.6 F (36.4 C) (01/27 0458) Pulse Rate:  [52-84] 58  (01/27 0458) Resp:  [15-20] 16  (01/27 0759) BP: (88-203)/(48-82) 119/61 mmHg (01/27 0458) SpO2:  [93 %-100 %] 99 % (01/27 0759) Weight:  [47.9 kg (105 lb 9.6 oz)] 47.9 kg (105 lb 9.6 oz) (01/27 0458)  Intake/Output from previous day: 01/26 0701 - 01/27 0700 In: 2350 [P.O.:60; I.V.:2090; IV Piggyback:200] Out: 940 [Urine:815; Blood:125] Intake/Output      01/26 0701 - 01/27 0700 01/27 0701 - 01/28 0700   P.O. 60 0   I.V. (mL/kg) 2090 (43.6)    IV Piggyback 200    Total Intake(mL/kg) 2350 (49.1)    Urine (mL/kg/hr) 815 (0.7)    Blood 125    Total Output 940    Net +1410 0          LABS  Basename 02/01/12 0705 01/31/12 0558 01/30/12 1456 01/30/12 1417  HGB 8.2* 10.7* 11.2* 11.0*    Basename 02/01/12 0705 01/31/12 0558  WBC 8.0 9.0  RBC 2.56* 3.39*  HCT 24.9* 32.5*  PLT 178 245    Basename 02/01/12 0705 01/31/12 0558  NA 136 139  K 4.3 3.3*  CL 107 105  CO2 15* 22  BUN 23 21  CREATININE 1.52* 1.11*  GLUCOSE 80 103*  CALCIUM 8.7 9.9   No results found for this basename: LABPT:2,INR:2 in the last 72 hours  Gram stain      Status: Final result   MyChart: Not Released          Component  Value     Specimen Description  ABSCESS RIGHT BACK     Special Requests  NONE     Gram Stain  RARE WBC PRESENT, PREDOMINANTLY MONONUCLEAR ABUNDANT GRAM POSITIVE  COCCI IN PAIRS CALLED TO ALLEN,J RN 01/31/12 1515 WOOTEN,K     Report Status  01/31/2012 FINAL     Resulting Agency  [facility]     Specimen Collected: 01/31/12  2:15 PM  Last Resulted: 01/31/12  3:09 PM     Physical Exam  Gen: Awake and alert, NAD, appears comfortable Lungs: dec at bases but clear Cardiac:s1 and s2 Abd: + BS, NT GU: foley in place Ext:       Right Lower Extremity and Low back  Dressing R hip and back stable  DPN, SPN, TN sensation intact  EHL, FHL, AT, PT, peroneal, gastrocsoleus motor functions intact  + DP pulse  No DCT  Compartments soft and NT  No significant swelling  Ext warm   Assessment/Plan: 1 Day Post-Op Procedure(s) (LRB): ARTHROPLASTY BIPOLAR HIP (Right)  79 y/o AA female s/p fall with R femoral neck fracture  1. Fall  Appears to have been mechanical 2. R femoral neck fracture s/p R hip hemiarthroplasty POD 1  Pt stable  WBAT   Posterior hip precautions   PT/OT evals  Ok to take straps off of abduction pillow  Ice prn  Dressing change tomorrow  Low energy fall with hip fracture = probable osteoporosis (also appears pt has had some c-spine fxs in past as well)                         Likely primary osteoporosis due to age                         vitamin D levels pending                         Will start on calcium and vitamin D                         Will discuss DEXA with pt and family, leave it up to them if they want to obtain but would recommend.                           Can also discuss pharmacologic therapy as well at outpt f/u  3. R back, buttock sebaceous cyst  Will change dressing tomorrow and advance packing  Continue with empiric coverage with vanc and levaquin until cx final  abx dosing per pharm due to dec CrCl  There were gram + cocci in pairs on gram stain 4. ABL anemia  Drop in h/h from pre-op, expected  Continue to monitor  Currently asymptomatic   No transfusion yet  Check cbc tomorrow  Maintain foley to  monitor I&O's 5. DVT/PE prophylaxis  Lovenox x 21 days post op (day 1/21)  On lovenox 30 mg sq daily  6. FEN  Advance diet as tolerated  Dietetics consult for hip fracture protocol  Maintain foley to monitor I&O's 7. Medical issues   Per primary team 8. Activity  OOB with assistance  WBAT R leg  Posterior hip precaution R hip 9. Dispo  PT/OT consults  SW for SNF consult, may consider CIR consult depending on level of participation with PT/OT. Dementia and ability to follow instructions may be a limiting factor  Mearl Latin, PA-C [facility] [phone] (P) 02/01/2012 9:32 AM

## 2012-02-01 NOTE — Progress Notes (Signed)
Subjective:  Patient complains of right hip pain. Denies any chest pain or shortness of breath. Confused.  Objective:  Vital Signs in the last 24 hours: Temp:  [96.8 F (36 C)-98.2 F (36.8 C)] 97.6 F (36.4 C) (01/27 0458) Pulse Rate:  [52-84] 60  (01/27 0937) Resp:  [15-88] 88  (01/27 0937) BP: (88-203)/(48-82) 124/67 mmHg (01/27 0937) SpO2:  [93 %-100 %] 96 % (01/27 0937) Weight:  [47.9 kg (105 lb 9.6 oz)] 47.9 kg (105 lb 9.6 oz) (01/27 0458)  Intake/Output from previous day: 01/26 0701 - 01/27 0700 In: 2350 [P.O.:60; I.V.:2090; IV Piggyback:200] Out: 940 [Urine:815; Blood:125] Intake/Output from this shift: Total I/O In: 320 [P.O.:320] Out: -   Physical Exam: Neck: no adenopathy, no carotid bruit, no JVD and supple, symmetrical, trachea midline Lungs: Clear anterolaterally Heart: regular rate and rhythm, S1, S2 normal and No S3 gallop Abdomen: soft, non-tender; bowel sounds normal; no masses,  no organomegaly Extremities: extremities normal, atraumatic, no cyanosis or edema and Right hip surgical area dry and  Lab Results:  Basename 02/01/12 0705 01/31/12 0558  WBC 8.0 9.0  HGB 8.2* 10.7*  PLT 178 245    Basename 02/01/12 0705 01/31/12 0558  NA 136 139  K 4.3 3.3*  CL 107 105  CO2 15* 22  GLUCOSE 80 103*  BUN 23 21  CREATININE 1.52* 1.11*   No results found for this basename: TROPONINI:2,CK,MB:2 in the last 72 hours Hepatic Function Panel No results found for this basename: PROT,ALBUMIN,AST,ALT,ALKPHOS,BILITOT,BILIDIR,IBILI in the last 72 hours No results found for this basename: CHOL in the last 72 hours No results found for this basename: PROTIME in the last 72 hours  Imaging: Imaging results have been reviewed and Dg Hip Complete Right  01/30/2012  *RADIOLOGY REPORT*  Clinical Data: Hip pain post fall  RIGHT HIP - COMPLETE 2+ VIEW  Comparison: None.  Findings: Three views of the right hip submitted.  There is displaced fracture of the right femoral neck.   IMPRESSION: Displaced fracture of the right femoral neck.   Original Report Authenticated By: Natasha Mead, M.D.    Ct Head Wo Contrast  01/30/2012  *RADIOLOGY REPORT*  Clinical Data:  Demented patient with possible fall.  CT HEAD WITHOUT CONTRAST CT CERVICAL SPINE WITHOUT CONTRAST  Technique:  Multidetector CT imaging of the head and cervical spine was performed following the standard protocol without intravenous contrast.  Multiplanar CT image reconstructions of the cervical spine were also generated.  Comparison:  None.  CT HEAD  Findings: Severe cortical atrophy, mild deep atrophy, and severe cerebellar atrophy.  Moderate to severe changes of small vessel disease of the white matter diffusely.  Physiologic calcifications in the basal ganglia, left greater than right.  No mass lesion.  No midline shift.  No acute hemorrhage or hematoma.  No extra-axial fluid collections.  No evidence of acute infarction.  No skull fracture or other focal osseous abnormality involving the skull.  Opacification of scattered bilateral ethmoid air cells. Remaining visualized paranasal sinuses, bilateral mastoid air cells, and bilateral middle ear cavities well-aerated. Bilateral carotid siphon atherosclerosis.  IMPRESSION:  1.  No acute intracranial abnormality. 2.  Generalized atrophy.  Moderate to severe chronic microvascular ischemic changes of the white matter.  CT CERVICAL SPINE  Findings: No acute fractures identified involving the cervical spine.  Anterior wedge compression deformity of C3 which does not appear acute.  Disc space narrowing endplate hypertrophic changes at essentially every cervical and upper thoracic level.  No spinal stenosis.  Facet joints intact with severe diffuse degenerative changes. Coronal reformatted images demonstrate an intact craniocervical junction, intact C1-C2 articulation, intact dens, and intact lateral masses.  Combination of facet and uncinate hypertrophy account for multilevel foraminal  stenoses including moderate bilateral C3-4, mild right and moderate left C4-5, severe left and moderate right C5-6, severe bilateral C6-7, severe bilateral C7-T1.  Generalized osseous demineralization.  Visualized lung apices clear.  Multiple small nodules within the thyroid gland without evidence of a dominant nodule.  IMPRESSION:  1.  No acute fractures identified involving the cervical spine.  2.  Old wedge compression deformity of the anterior C3 vertebral body. 4.  Multilevel degenerative disc disease, spondylosis, and facet degenerative changes with multilevel foraminal stenoses as detailed above.   Original Report Authenticated By: Hulan Saas, M.D.    Ct Cervical Spine Wo Contrast  01/30/2012  *RADIOLOGY REPORT*  Clinical Data:  Demented patient with possible fall.  CT HEAD WITHOUT CONTRAST CT CERVICAL SPINE WITHOUT CONTRAST  Technique:  Multidetector CT imaging of the head and cervical spine was performed following the standard protocol without intravenous contrast.  Multiplanar CT image reconstructions of the cervical spine were also generated.  Comparison:  None.  CT HEAD  Findings: Severe cortical atrophy, mild deep atrophy, and severe cerebellar atrophy.  Moderate to severe changes of small vessel disease of the white matter diffusely.  Physiologic calcifications in the basal ganglia, left greater than right.  No mass lesion.  No midline shift.  No acute hemorrhage or hematoma.  No extra-axial fluid collections.  No evidence of acute infarction.  No skull fracture or other focal osseous abnormality involving the skull.  Opacification of scattered bilateral ethmoid air cells. Remaining visualized paranasal sinuses, bilateral mastoid air cells, and bilateral middle ear cavities well-aerated. Bilateral carotid siphon atherosclerosis.  IMPRESSION:  1.  No acute intracranial abnormality. 2.  Generalized atrophy.  Moderate to severe chronic microvascular ischemic changes of the white matter.  CT  CERVICAL SPINE  Findings: No acute fractures identified involving the cervical spine.  Anterior wedge compression deformity of C3 which does not appear acute.  Disc space narrowing endplate hypertrophic changes at essentially every cervical and upper thoracic level.  No spinal stenosis.  Facet joints intact with severe diffuse degenerative changes. Coronal reformatted images demonstrate an intact craniocervical junction, intact C1-C2 articulation, intact dens, and intact lateral masses.  Combination of facet and uncinate hypertrophy account for multilevel foraminal stenoses including moderate bilateral C3-4, mild right and moderate left C4-5, severe left and moderate right C5-6, severe bilateral C6-7, severe bilateral C7-T1.  Generalized osseous demineralization.  Visualized lung apices clear.  Multiple small nodules within the thyroid gland without evidence of a dominant nodule.  IMPRESSION:  1.  No acute fractures identified involving the cervical spine.  2.  Old wedge compression deformity of the anterior C3 vertebral body. 4.  Multilevel degenerative disc disease, spondylosis, and facet degenerative changes with multilevel foraminal stenoses as detailed above.   Original Report Authenticated By: Hulan Saas, M.D.    Dg Pelvis Portable  01/31/2012  *RADIOLOGY REPORT*  Clinical Data: Right hip hemiarthroplasty - postoperative.  PORTABLE PELVIS  Comparison: 01/30/2012  Findings: A single frontal view of the pelvis demonstrates right hip hemiarthroplasty without complicating features. Postoperative changes in the soft tissues are noted.  IMPRESSION: Right hip hemiarthroplasty without complicating features.   Original Report Authenticated By: Harmon Pier, M.D.    Dg Chest Port 1 View  01/31/2012  *RADIOLOGY REPORT*  Clinical Data: Preoperative respiratory evaluation  prior to ORIF of a right femoral neck fracture.  PORTABLE CHEST - 1 VIEW 01/31/2012 0600 hours:  Comparison: Two-view chest x-ray 12/23/2009.   Findings: Suboptimal inspiration accounts for atelectasis in the lung bases.  Stable scarring in the right upper lobe.  Lungs otherwise clear.  Cardiac silhouette enlarged but stable.  Thoracic aorta tortuous atherosclerotic, unchanged.  IMPRESSION: Suboptimal inspiration accounts for bibasilar atelectasis.  No acute cardiopulmonary disease otherwise.  Stable right upper lobe scarring.   Original Report Authenticated By: Hulan Saas, M.D.    Dg Hip Portable 1 View Right  01/31/2012  *RADIOLOGY REPORT*  Clinical Data: Right hip hemiarthroplasty - postoperative  PORTABLE RIGHT HIP - 1 VIEW  Comparison: 01/30/2012  Findings: A cross-table lateral view of the right hip demonstrates right hip hemiarthroplasty without complicating features.  IMPRESSION: Right hip hemiarthroplasty without definite complicating features.   Original Report Authenticated By: Harmon Pier, M.D.    Dg Knee Right Port  01/30/2012  *RADIOLOGY REPORT*  Clinical Data: Preoperative planning film.  Right hip fracture after a fall.  PORTABLE RIGHT KNEE - 1-2 VIEW  Comparison: None.  Findings: Imaged bones, joints and soft tissues appear normal.  IMPRESSION: Negative exam.   Original Report Authenticated By: Holley Dexter, M.D.     Cardiac Studies:  Assessment/Plan:  Status post right femoral neck fracture status post right hip hemiarthroplasty Dementia Right lung nodule History of CVA Hypercholesteremia CAD Plan Continue present management Ambulate as tolerated as per orthopedics  LOS: 2 days    Baljit Liebert N 02/01/2012, 1:01 PM

## 2012-02-02 LAB — CBC
MCH: 32.2 pg (ref 26.0–34.0)
MCHC: 32.7 g/dL (ref 30.0–36.0)
Platelets: 211 10*3/uL (ref 150–400)
RBC: 2.55 MIL/uL — ABNORMAL LOW (ref 3.87–5.11)
RDW: 13.2 % (ref 11.5–15.5)

## 2012-02-02 LAB — ALBUMIN: Albumin: 2.8 g/dL — ABNORMAL LOW (ref 3.5–5.2)

## 2012-02-02 LAB — PREALBUMIN: Prealbumin: 14.7 mg/dL — ABNORMAL LOW (ref 17.0–34.0)

## 2012-02-02 LAB — BASIC METABOLIC PANEL
Calcium: 9.3 mg/dL (ref 8.4–10.5)
Creatinine, Ser: 1.44 mg/dL — ABNORMAL HIGH (ref 0.50–1.10)
GFR calc Af Amer: 37 mL/min — ABNORMAL LOW (ref >90)
GFR calc non Af Amer: 32 mL/min — ABNORMAL LOW (ref >90)
Sodium: 139 mEq/L (ref 135–145)

## 2012-02-02 MED ORDER — VITAMIN D (ERGOCALCIFEROL) 1.25 MG (50000 UNIT) PO CAPS
50000.0000 [IU] | ORAL_CAPSULE | ORAL | Status: DC
  Administered 2012-02-02: 50000 [IU] via ORAL
  Filled 2012-02-02: qty 1

## 2012-02-02 NOTE — Evaluation (Signed)
Occupational Therapy Evaluation Patient Details Name: Annette Cantrell MRN: [id number] DOB: October 11, 1922 Today's Date: 02/02/2012 Time: 4098-1191 OT Time Calculation (min): 13 min  OT Assessment / Plan / Recommendation Clinical Impression  This 79 yo female s/p fall and now with RTHA presents to acute OT with problems below. Will benefit from acute OT with follow up OT at SNF.    OT Assessment  Patient needs continued OT Services    Follow Up Recommendations  SNF       Equipment Recommendations  None recommended by OT       Frequency  Min 2X/week    Precautions / Restrictions Precautions Precautions: Fall;Posterior Hip Restrictions Weight Bearing Restrictions: Yes RLE Weight Bearing: Weight bearing as tolerated   Pertinent Vitals/Pain "Ouch that hurts" when putting weight through RLE with ambulation    ADL  Eating/Feeding: Simulated;Set up;Supervision/safety Where Assessed - Eating/Feeding: Chair Grooming: Performed;Wash/dry face;Teeth care;Set up;Supervision/safety (min A standing at sink) Where Assessed - Grooming: Supported standing Upper Body Bathing: Simulated;Supervision/safety;Set up Where Assessed - Upper Body Bathing: Supported sitting Lower Body Bathing: Simulated;+1 Total assistance Where Assessed - Lower Body Bathing: Supported sit to stand Upper Body Dressing: Simulated;Moderate assistance Where Assessed - Upper Body Dressing: Supported sitting Lower Body Dressing: Simulated;+1 Total assistance Where Assessed - Lower Body Dressing: Supported sit to Pharmacist, hospital: Simulated;Moderate assistance Toilet Transfer Method: Sit to stand Toilet Transfer Equipment:  (Bed, over to sink to groom, sit in recliner behind her) Toileting - Architect and Hygiene: Simulated;+1 Total assistance Where Assessed - Engineer, mining and Hygiene: Standing Equipment Used: Rolling walker;Gait belt Transfers/Ambulation Related to ADLs: Min A sit to  stand, Mod A stand to sit, Min-Mod A for ambulation    OT Diagnosis: Generalized weakness;Acute pain  OT Problem List: Decreased strength;Decreased activity tolerance;Impaired balance (sitting and/or standing);Decreased cognition;Decreased knowledge of precautions;Decreased knowledge of use of DME or AE;Pain OT Treatment Interventions: Self-care/ADL training;DME and/or AE instruction;Patient/family education;Balance training;Cognitive remediation/compensation   OT Goals Acute Rehab OT Goals OT Goal Formulation: Patient unable to participate in goal setting Time For Goal Achievement: 02/16/12 Potential to Achieve Goals: Good ADL Goals Pt Will Perform Grooming: Unsupported;Standing at sink (min guard A, 2 tasks) ADL Goal: Grooming - Progress: Goal set today Pt Will Transfer to Toilet: Independently;Ambulation;Comfort height toilet;Grab bars;3-in-1;Maintaining hip precautions (min guard A) ADL Goal: Toilet Transfer - Progress: Goal set today Pt Will Perform Toileting - Clothing Manipulation: Standing (min guard A) ADL Goal: Toileting - Clothing Manipulation - Progress: Goal set today Pt Will Perform Toileting - Hygiene: Sit to stand from 3-in-1/toilet (min guard A) ADL Goal: Toileting - Hygiene - Progress: Goal set today Miscellaneous OT Goals Miscellaneous OT Goal #1: Pt will be Supervision for OOB for BADLs OT Goal: Miscellaneous Goal #1 - Progress: Goal set today Miscellaneous OT Goal #2: Daughter will demonstrate how to help pt with LB ADLs. OT Goal: Miscellaneous Goal #2 - Progress: Goal set today  Visit Information  Last OT Received On: 02/02/12 Assistance Needed: +1 (additional person from lines/chair good) PT/OT Co-Evaluation/Treatment: Yes       Prior Functioning     Home Living Lives With: Daughter Available Help at Discharge: Personal care attendant;Available 24 hours/day Type of Home: House Home Layout: One level Bathroom Shower/Tub: Tub/shower unit;Curtain Bathroom  Toilet: Standard Home Adaptive Equipment: Bedside commode/3-in-1;Shower chair with back;Walker - rolling Prior Function Level of Independence: Needs assistance Needs Assistance: Bathing;Dressing;Meal Prep;Light Housekeeping Bath: Moderate Dressing: Moderate Meal Prep: Total Light Housekeeping: Total Driving: No Comments:  Per PT interaction with daughter yesterday, not sure of PLOF due to inconsistent answers Communication Communication: No difficulties Dominant Hand: Right         Vision/Perception  Wears glass all the time   Cognition  Overall Cognitive Status: History of cognitive impairments - at baseline Arousal/Alertness: Awake/alert Orientation Level: Disoriented to;Time Behavior During Session: [facility] for tasks performed Cognition - Other Comments: Could not remember hip precautions in middle of session even though was reminded of them at beginning of the session. Had trouble sequencing setup to get ready to brush her teeth (could not process getting tooth paste on toothbrush, then when I had her open the toothpaste she opened it and squirted it into her mouth(not the norm, but not really wrong either); however then she needed cues to pick up the toothbrush and start brushing her teeth.   Extremity/Trunk Assessment Right Upper Extremity Assessment RUE ROM/Strength/Tone:  (grossly 3/5) Left Upper Extremity Assessment LUE ROM/Strength/Tone:  (grossly 3/5)     Mobility Bed Mobility Bed Mobility: Supine to Sit Supine to Sit: 3: Mod assist Sitting - Scoot to Edge of Bed: 3: Mod assist Details for Bed Mobility Assistance: cueing for sequence, guarding of RLE for precautions and use of pad to scoot to EOB Transfers Transfers: Sit to Stand;Stand to Sit Sit to Stand: 3: Mod assist Sit to Stand: Patient Percentage: 60% Stand to Sit: To chair/3-in-1;With armrests;With upper extremity assist;3: Mod assist Stand to Sit: Patient Percentage: 60% Details for Transfer Assistance:  cueing for hand placement, guarding positioning for RLE for precautions and hand over hand assist for placement           Balance Static Standing Balance Static Standing - Balance Support: No upper extremity supported Static Standing - Level of Assistance: 5: Stand by assistance Static Standing - Comment/# of Minutes: 2   End of Session OT - End of Session Equipment Utilized During Treatment: Gait belt (RW) Activity Tolerance: Patient limited by fatigue Patient left: in chair;with call bell/phone within reach       Evette Georges [phone] 02/02/2012, 2:19 PM

## 2012-02-02 NOTE — Progress Notes (Signed)
Physical Therapy Treatment Patient Details Name: Annette Cantrell MRN: [id number] DOB: 1922-09-13 Today's Date: 02/02/2012 Time: 6962-9528 PT Time Calculation (min): 29 min  PT Assessment / Plan / Recommendation Comments on Treatment Session  Pt admitted s/p fall with right femur fx and s/p hemiarthoplasty. Pt continues to be unable to recall precautions even after education 3x during tx. Pt more calm and participative today and aware she is in hospital and that she had surgery. Pt tolerating weight bearing well on RLE but fatigues quickly. Continue to recommend ST-SNF prior to return home. Will follow.     Follow Up Recommendations        Does the patient have the potential to tolerate intense rehabilitation     Barriers to Discharge        Equipment Recommendations       Recommendations for Other Services    Frequency     Plan Discharge plan remains appropriate;Frequency remains appropriate    Precautions / Restrictions Precautions Precautions: Fall;Posterior Hip Restrictions Weight Bearing Restrictions: Yes RLE Weight Bearing: Weight bearing as tolerated   Pertinent Vitals/Pain Pt reports stinging pain at incision grossly 3/10    Mobility  Bed Mobility Bed Mobility: Supine to Sit Supine to Sit: 3: Mod assist Sitting - Scoot to Edge of Bed: 3: Mod assist Details for Bed Mobility Assistance: cueing for sequence, guarding of RLE for precautions and use of pad to scoot to EOB Transfers Sit to Stand: 3: Mod assist;From bed Stand to Sit: To chair/3-in-1;With armrests;3: Mod assist Details for Transfer Assistance: cueing for hand placement, guarding and positioning of RLE for precautions and hand over hand assist for placement.  Ambulation/Gait Ambulation/Gait Assistance: 4: Min assist Ambulation Distance (Feet): 12 Feet Assistive device: Rolling walker Ambulation/Gait Assistance Details: cueing for sequence, pushing through bil UE and encouragement to progress  activity Gait Pattern: Step-to pattern;Decreased stance time - right Gait velocity: decreased    Exercises General Exercises - Lower Extremity Ankle Circles/Pumps: AROM;Both;10 reps;Seated Long Arc Quad: AROM;Both;10 reps;Seated Hip ABduction/ADduction: AAROM;Both;10 reps;Seated Hip Flexion/Marching: AAROM;AROM;Right;Left;10 reps;Seated (AAROM RLE)   PT Diagnosis:    PT Problem List:   PT Treatment Interventions:     PT Goals Acute Rehab PT Goals PT Goal: Supine/Side to Sit - Progress: Progressing toward goal PT Goal: Sit to Stand - Progress: Progressing toward goal PT Goal: Stand to Sit - Progress: Progressing toward goal Pt will Ambulate: 51 - 150 feet;with rolling walker;with min assist (minguard) PT Goal: Ambulate - Progress: Updated due to goal met  Visit Information  Last PT Received On: 02/02/12 Assistance Needed: +1 PT/OT Co-Evaluation/Treatment: Yes    Subjective Data  Subjective: "thank you for all you've done"   Cognition  Overall Cognitive Status: History of cognitive impairments - at baseline Arousal/Alertness: Awake/alert Orientation Level: Disoriented to;Time Behavior During Session: [facility] for tasks performed    Balance  Static Standing Balance Static Standing - Balance Support: No upper extremity supported Static Standing - Level of Assistance: 5: Stand by assistance Static Standing - Comment/# of Minutes: 2  End of Session PT - End of Session Equipment Utilized During Treatment: Gait belt Activity Tolerance: Patient tolerated treatment well Patient left: in chair;with call bell/phone within reach   GP     Delorse Lek 02/02/2012, 9:58 AM Delaney Meigs, PT [phone]

## 2012-02-02 NOTE — Progress Notes (Signed)
Orthopaedic Trauma Service (OTS)  Subjective: 2 Days Post-Op Procedure(s) (LRB): ARTHROPLASTY BIPOLAR HIP (Right) Patient reports pain as minimal.  Feels great today and everything is "beautiful."  Objective: Current Vitals Blood pressure 152/74, pulse 87, temperature 98.9 F (37.2 C), temperature source Oral, resp. rate 16, height 5\' 1"  (1.549 m), weight 104 lb 6.4 oz (47.356 kg), SpO2 98.00%. Vital signs in last 24 hours: Temp:  [98 F (36.7 C)-98.9 F (37.2 C)] 98.9 F (37.2 C) (01/28 0540) Pulse Rate:  [60-87] 87  (01/28 0540) Resp:  [16-88] 16  (01/28 0540) BP: (92-152)/(51-74) 152/74 mmHg (01/28 0540) SpO2:  [94 %-98 %] 98 % (01/28 0540) Weight:  [104 lb 6.4 oz (47.356 kg)] 104 lb 6.4 oz (47.356 kg) (01/28 0540)  Intake/Output from previous day: 01/27 0701 - 01/28 0700 In: 1630 [P.O.:380; I.V.:1250] Out: 1275 [Urine:1275]  LABS  Basename 02/02/12 0630 02/01/12 0705 01/31/12 0558 01/30/12 1456 01/30/12 1417  HGB 8.2* 8.2* 10.7* 11.2* 11.0*    Basename 02/02/12 0630 02/01/12 0705  WBC 7.9 8.0  RBC 2.55* 2.56*  HCT 25.1* 24.9*  PLT 211 178    Basename 02/02/12 0630 02/01/12 0705  NA 139 136  K 3.8 4.3  CL 107 107  CO2 20 15*  BUN 24* 23  CREATININE 1.44* 1.52*  GLUCOSE 95 80  CALCIUM 9.3 8.7   No results found for this basename: LABPT:2,INR:2 in the last 72 hours   Physical Exam  RLE Drsg pristine  Sens DPN, SPN, TN intact  Motor EHL, ext, flex, evers 5/5  DP 2+, PT 2+   Imaging Dg Pelvis Portable  01/31/2012  *RADIOLOGY REPORT*  Clinical Data: Right hip hemiarthroplasty - postoperative.  PORTABLE PELVIS  Comparison: 01/30/2012  Findings: A single frontal view of the pelvis demonstrates right hip hemiarthroplasty without complicating features. Postoperative changes in the soft tissues are noted.  IMPRESSION: Right hip hemiarthroplasty without complicating features.   Original Report Authenticated By: Harmon Pier, M.D.    Dg Hip Portable 1 View  Right  01/31/2012  *RADIOLOGY REPORT*  Clinical Data: Right hip hemiarthroplasty - postoperative  PORTABLE RIGHT HIP - 1 VIEW  Comparison: 01/30/2012  Findings: A cross-table lateral view of the right hip demonstrates right hip hemiarthroplasty without complicating features.  IMPRESSION: Right hip hemiarthroplasty without definite complicating features.   Original Report Authenticated By: Harmon Pier, M.D.     Assessment/Plan: 2 Days Post-Op Procedure(s) (LRB): ARTHROPLASTY BIPOLAR HIP (Right) Acute blood loss anemia  1. PT/OT: WBAT, hip precautions 2. Anemia stabilizing and no symptoms; ok to d/c foley and will defer xfusion to primary service; recheck H/H one more time tomorrow 3. F/u in 10 days  Myrene Galas, MD [facility] [phone] (p)   02/02/2012, 8:37 AM

## 2012-02-02 NOTE — Progress Notes (Signed)
Subjective:  Patient denies any chest pain or shortness of breath states the pain is improved patient is more alert and appropriate today  Objective:  Vital Signs in the last 24 hours: Temp:  [98 F (36.7 C)-98.9 F (37.2 C)] 98.9 F (37.2 C) (01/28 0540) Pulse Rate:  [65-87] 87  (01/28 0540) Resp:  [16-19] 16  (01/28 0540) BP: (92-152)/(51-74) 152/74 mmHg (01/28 0540) SpO2:  [94 %-98 %] 98 % (01/28 0540) Weight:  [47.356 kg (104 lb 6.4 oz)] 47.356 kg (104 lb 6.4 oz) (01/28 0540)  Intake/Output from previous day: 01/27 0701 - 01/28 0700 In: 1630 [P.O.:380; I.V.:1250] Out: 1275 [Urine:1275] Intake/Output from this shift:    Physical Exam: Neck: no adenopathy, no carotid bruit, no JVD and supple, symmetrical, trachea midline Lungs: clear to auscultation bilaterally Heart: regular rate and rhythm, S1, S2 normal, no murmur, click, rub or gallop Abdomen: soft, non-tender; bowel sounds normal; no masses,  no organomegaly Extremities: extremities normal, atraumatic, no cyanosis or edema and Right hip surgical area dressing dry  Lab Results:  Basename 02/02/12 0630 02/01/12 0705  WBC 7.9 8.0  HGB 8.2* 8.2*  PLT 211 178    Basename 02/02/12 0630 02/01/12 0705  NA 139 136  K 3.8 4.3  CL 107 107  CO2 20 15*  GLUCOSE 95 80  BUN 24* 23  CREATININE 1.44* 1.52*   No results found for this basename: TROPONINI:2,CK,MB:2 in the last 72 hours Hepatic Function Panel  Basename 02/02/12 0630  PROT --  ALBUMIN 2.8*  AST --  ALT --  ALKPHOS --  BILITOT --  BILIDIR --  IBILI --   No results found for this basename: CHOL in the last 72 hours No results found for this basename: PROTIME in the last 72 hours  Imaging: Imaging results have been reviewed and Dg Pelvis Portable  01/31/2012  *RADIOLOGY REPORT*  Clinical Data: Right hip hemiarthroplasty - postoperative.  PORTABLE PELVIS  Comparison: 01/30/2012  Findings: A single frontal view of the pelvis demonstrates right hip  hemiarthroplasty without complicating features. Postoperative changes in the soft tissues are noted.  IMPRESSION: Right hip hemiarthroplasty without complicating features.   Original Report Authenticated By: Harmon Pier, M.D.    Dg Hip Portable 1 View Right  01/31/2012  *RADIOLOGY REPORT*  Clinical Data: Right hip hemiarthroplasty - postoperative  PORTABLE RIGHT HIP - 1 VIEW  Comparison: 01/30/2012  Findings: A cross-table lateral view of the right hip demonstrates right hip hemiarthroplasty without complicating features.  IMPRESSION: Right hip hemiarthroplasty without definite complicating features.   Original Report Authenticated By: Harmon Pier, M.D.     Cardiac Studies:  Assessment/Plan:  Status post right femoral neck fracture status post right hip hemiarthroplasty  Dementia  Right lung nodule  History of CVA  Hypercholesteremia  CAD Postop anemia stable Plan Continue present management DC Foley Increase ambulation Will DC once okay from orthopedics point of view  LOS: 3 days    Annette Cantrell N 02/02/2012, 9:44 AM

## 2012-02-02 NOTE — Clinical Documentation Improvement (Signed)
CHANGE MENTAL STATUS DOCUMENTATION CLARIFICATION   THIS DOCUMENT IS NOT A PERMANENT PART OF THE MEDICAL RECORD         02/02/12  Dr. Sharyn Lull and/or Associates,  In an effort to better capture your patient's severity of illness, reflect appropriate length of stay and utilization of resources, a review of the patient medical record has revealed the following indicators:  79 yo admitted with right femoral neck fracture with known history of dementia per H&P  Status post right hip hemiarthroplasty 01/31/12  Patient received Dilaudid for pain preoperatively, Anesthesia Medications for surgery, Dilaudid and Norco for pain postoperatively and Haldol  "Subjective:  Patient complains of right hip pain. Denies any chest pain or shortness of breath. Confused." Dr. Sharyn Lull  Progress Note 02/01/12  "Subjective:  Patient denies any chest pain or shortness of breath states the pain is improved patient is more alert and appropriate today." Dr. Sharyn Lull Progress Note 02/02/12  "Confused today, doesn't know where she is or why she is here. Cooperative with encouragement."  Nevin Bloodgood, PT at 02/03/12 1050   "Confusion persists but does know she is at the hospital today. Needs encouragement to progress."  Nevin Bloodgood, PT at 02/04/12 0935     Based on your clinical judgment, please document in the progress notes and discharge summary if a condition below provides greater specificity regarding the documentation of "Confused":   - Encephalopathy, including type if known   - Drug Induced Confusion/Delirium, including name of possible drug or drugs   - Acute Confusion   - Acute Delirium   - Acute Exacerbation of Known Dementia, including type if known   - Other Condition   - Unable to Clinically Determine   In responding to this query please exercise your independent judgment.    The fact that a query is asked, does not imply that any particular answer is desired or  expected.   Reviewed:  no additional documentation provided  Thank You,  Jerral Ralph  RN BSN CCDS Certified Clinical Documentation Specialist: Cell   [phone]  Health Information Management [place]  TO RESPOND TO THE THIS QUERY, FOLLOW THE INSTRUCTIONS BELOW:  1. If needed, update documentation for the patient's encounter via the notes activity.  2. Access this query again and click edit on the In [employer].  3. After updating, or not, click F2 to complete all highlighted (required) fields concerning your review. Select "additional documentation in the medical record" OR "no additional documentation provided".  4. Click Sign note button.  5. The deficiency will fall out of your In Basket *Please let us know if you are not able to complete this workflow by phone or e-mail (listed below).

## 2012-02-02 NOTE — Care Management Note (Unsigned)
    Page 1 of 1   02/02/2012     2:52:30 PM   CARE MANAGEMENT NOTE 02/02/2012  Patient:  Annette Cantrell, Annette Cantrell   Account Number:  [id number]  Date Initiated:  02/02/2012  Documentation initiated by:  Tera Mater  Subjective/Objective Assessment:   79yo female admitted after fall. Pt. had surgical intervention of right hip on 1/26.     Action/Plan:   Will dc to SNF for rehab   Anticipated DC Date:  02/04/2012   Anticipated DC Plan:  SKILLED NURSING FACILITY  In-house referral  Clinical Social Worker      DC Planning Services  CM consult      Choice offered to / List presented to:             Status of service:  In process, will continue to follow Medicare Important Message given?   (If response is "NO", the following Medicare IM given date fields will be blank) Date Medicare IM given:   Date Additional Medicare IM given:    Discharge Disposition:    Per UR Regulation:  Reviewed for med. necessity/level of care/duration of stay  If discussed at Long Length of Stay Meetings, dates discussed:    Comments:  02/02/12 1451 PT recommends pt. to go to SNF on dc. Tera Mater, RN, BSN NCM [phone]

## 2012-02-02 NOTE — Progress Notes (Signed)
I have seen and examined the patient. I agree with the findings above.  [name], MD 02/02/2012 8:42 AM

## 2012-02-03 LAB — CBC
HCT: 29 % — ABNORMAL LOW (ref 36.0–46.0)
MCV: 99.7 fL (ref 78.0–100.0)
Platelets: 257 10*3/uL (ref 150–400)

## 2012-02-03 LAB — CULTURE, ROUTINE-ABSCESS

## 2012-02-03 LAB — VITAMIN D 1,25 DIHYDROXY
Vitamin D 1, 25 (OH)2 Total: 65 pg/mL (ref 18–72)
Vitamin D2 1, 25 (OH)2: <8 pg/mL

## 2012-02-03 MED ORDER — ENOXAPARIN SODIUM 30 MG/0.3ML ~~LOC~~ SOLN: 30.0000 mg | SUBCUTANEOUS | Status: DC

## 2012-02-03 MED ORDER — ACETAMINOPHEN 325 MG PO TABS: 325.0000 mg | ORAL_TABLET | Freq: Four times a day (QID) | ORAL | Status: DC | PRN

## 2012-02-03 MED ORDER — CALCIUM CITRATE 950 (200 CA) MG PO TABS: 2.0000 | ORAL_TABLET | Freq: Two times a day (BID) | ORAL | Status: DC

## 2012-02-03 MED ORDER — VITAMIN D (ERGOCALCIFEROL) 1.25 MG (50000 UNIT) PO CAPS: 50000.0000 [IU] | ORAL_CAPSULE | ORAL | Status: DC

## 2012-02-03 MED ORDER — LEVOFLOXACIN 500 MG PO TABS
500.0000 mg | ORAL_TABLET | ORAL | Status: DC
  Administered 2012-02-03: 500 mg via ORAL
  Filled 2012-02-03: qty 1

## 2012-02-03 MED ORDER — DOXYCYCLINE HYCLATE 100 MG PO TABS
100.0000 mg | ORAL_TABLET | Freq: Two times a day (BID) | ORAL | Status: DC
  Administered 2012-02-03 – 2012-02-04 (×3): 100 mg via ORAL
  Filled 2012-02-03 (×4): qty 1

## 2012-02-03 NOTE — Progress Notes (Signed)
We have discussed findings and developed plan as stated.  Myrene Galas, MD [facility] [phone] (p)

## 2012-02-03 NOTE — Progress Notes (Signed)
Patient referred for short term SNF. Spoke to patient's daughter Britta Mccreedy and 2 granddaughters Jasmine December and [employer]. SNF bed search procedure discussed and family agrees to short term SNF. Search initiated in [place]. CSW assessment to follow.  Awaiting MD decision regarding stability; family is prepared for placement tomorrow if stable and will be given bed offers in the a.m to choose facility.  Fl2 placed on chart for MD's signature.  Lorri Frederick. [place]  [phone]

## 2012-02-03 NOTE — Progress Notes (Signed)
Physical Therapy Treatment Patient Details Name: Annette Cantrell MRN: [id number] DOB: Jun 01, 1922 Today's Date: 02/03/2012 Time: 0832-0900 PT Time Calculation (min): 28 min  PT Assessment / Plan / Recommendation Comments on Treatment Session  Admitted s/p fall with right femur fx and s/p hemiarthroplasty. Confused today, doesn't know where she is or why she is here. Cooperative with encouragement.     Follow Up Recommendations  SNF;Supervision/Assistance - 24 hour     Does the patient have the potential to tolerate intense rehabilitation     Barriers to Discharge        Equipment Recommendations  Rolling walker with 5" wheels    Recommendations for Other Services    Frequency Min 5X/week   Plan Discharge plan remains appropriate;Frequency remains appropriate    Precautions / Restrictions Precautions Precautions: Fall;Posterior Hip Restrictions Weight Bearing Restrictions: Yes RLE Weight Bearing: Weight bearing as tolerated   Pertinent Vitals/Pain C/o pain everywhere, RN in the room and aware and pt has been given pain meds prior to session; pt unaware of where she is or that she has broken her leg needing many reminders    Mobility  Bed Mobility Supine to Sit: 3: Mod assist;HOB elevated (30 degrees) Sitting - Scoot to Edge of Bed: 4: Min assist Details for Bed Mobility Assistance: sequencing cues and assist to maintain precautions (pt did not remember these at all), needs a lot of encouragement Transfers Transfers: Sit to Stand;Stand to Sit Sit to Stand: 3: Mod assist;With upper extremity assist;From bed;From elevated surface Stand to Sit: 4: Min assist;To chair/3-in-1;With upper extremity assist;With armrests Details for Transfer Assistance: cues for safe hand placement and gaurding for precautions, mod facilitation at hips for power up and guidance of hands to RW Ambulation/Gait Ambulation/Gait Assistance: 4: Min assist Ambulation Distance (Feet): 16 Feet Assistive  device: Rolling walker Ambulation/Gait Assistance Details: cues for tall posture and sequencing of RW and stepping; encouragement to participate and progress Gait Pattern: Step-to pattern;Decreased stance time - right    Exercises General Exercises - Lower Extremity Ankle Circles/Pumps: AROM;Both;10 reps;Supine Quad Sets: AROM;Right;10 reps;Supine Heel Slides: AAROM;Right;10 reps;Supine    PT Goals Acute Rehab PT Goals PT Goal: Supine/Side to Sit - Progress: Progressing toward goal PT Goal: Sit to Stand - Progress: Progressing toward goal PT Goal: Stand to Sit - Progress: Progressing toward goal PT Transfer Goal: Bed to Chair/Chair to Bed - Progress: Progressing toward goal PT Goal: Ambulate - Progress: Progressing toward goal  Visit Information  Last PT Received On: 02/03/12 Assistance Needed: +1    Subjective Data  Subjective: Do I have to do it?    Cognition  Overall Cognitive Status: History of cognitive impairments - at baseline Arousal/Alertness: Awake/alert Orientation Level: Disoriented to;Place;Time;Situation Behavior During Session: Anxious Cognition - Other Comments: poor short term memory, no idea why she is here or where she is    Balance     End of Session PT - End of Session Equipment Utilized During Treatment: Gait belt Activity Tolerance: Patient tolerated treatment well Patient left: in chair;with call bell/phone within reach Nurse Communication: Mobility status   GP     [facility] HELEN 02/03/2012, 10:49 AM

## 2012-02-03 NOTE — Progress Notes (Signed)
ANTIBIOTIC CONSULT NOTE - FOLLOW UP  Pharmacy Consult for Vanco/Levaquin Indication: Infected cyst on backside  No Known Allergies  Patient Measurements: Height: 5\' 1"  (154.9 cm) Weight: 103 lb 9.9 oz (47 kg) IBW/kg (Calculated) : 47.8  Adjusted Body Weight:   Vital Signs: Temp: 98.2 F (36.8 C) (01/29 0423) Temp src: Oral (01/29 0423) BP: 136/66 mmHg (01/29 0423) Pulse Rate: 83  (01/29 0423) Intake/Output from previous day: 01/28 0701 - 01/29 0700 In: 120 [P.O.:120] Out: 675 [Urine:675] Intake/Output from this shift: Total I/O In: 240 [P.O.:240] Out: -   Labs:  Basename 02/02/12 0630 02/01/12 0705  WBC 7.9 8.0  HGB 8.2* 8.2*  PLT 211 178  LABCREA -- --  CREATININE 1.44* 1.52*   Estimated Creatinine Clearance: 20.4 ml/min (by C-G formula based on Cr of 1.44). No results found for this basename: VANCOTROUGH:2,VANCOPEAK:2,VANCORANDOM:2,GENTTROUGH:2,GENTPEAK:2,GENTRANDOM:2,TOBRATROUGH:2,TOBRAPEAK:2,TOBRARND:2,AMIKACINPEAK:2,AMIKACINTROU:2,AMIKACIN:2, in the last 72 hours   Microbiology: Recent Results (from the past 720 hour(s))  CULTURE, ROUTINE-ABSCESS     Status: Normal   Collection Time   01/25/12  3:55 PM      Component Value Range Status Comment   Specimen Description ABSCESS BACK RIGHT   Final    Special Requests NONE   Final    Gram Stain     Final    Value: RARE WBC PRESENT, PREDOMINANTLY PMN     RARE SQUAMOUS EPITHELIAL CELLS PRESENT     MODERATE GRAM POSITIVE COCCI     IN PAIRS   Culture     Final    Value: MULTIPLE ORGANISMS PRESENT, NONE PREDOMINANT     Note: NO STAPHYLOCOCCUS AUREUS ISOLATED NO GROUP A STREP (S.PYOGENES) ISOLATED   Report Status 01/28/2012 FINAL   Final   ANAEROBIC CULTURE     Status: Normal (Preliminary result)   Collection Time   01/31/12  2:15 PM      Component Value Range Status Comment   Specimen Description ABSCESS RIGHT BACK   Final    Special Requests NONE   Final    Gram Stain PENDING   Incomplete    Culture     Final     Value: NO ANAEROBES ISOLATED; CULTURE IN PROGRESS FOR 5 DAYS   Report Status PENDING   Incomplete   AFB CULTURE WITH SMEAR     Status: Normal (Preliminary result)   Collection Time   01/31/12  2:15 PM      Component Value Range Status Comment   Specimen Description ABSCESS RIGHT BACK   Final    Special Requests NONE   Final    ACID FAST SMEAR NO ACID FAST BACILLI SEEN   Final    Culture     Final    Value: CULTURE WILL BE EXAMINED FOR 6 WEEKS BEFORE ISSUING A FINAL REPORT   Report Status PENDING   Incomplete   CULTURE, ROUTINE-ABSCESS     Status: Normal   Collection Time   01/31/12  2:15 PM      Component Value Range Status Comment   Specimen Description ABSCESS RIGHT BACK   Final    Special Requests NONE   Final    Gram Stain     Final    Value: RARE WBC PRESENT,BOTH PMN AND MONONUCLEAR     ABUNDANT GRAM POSITIVE COCCI     IN PAIRS Gram Stain Report Called to,Read Back By and Verified With: Gram Stain Report Called to,Read Back By and Verified With: Lanna Poche RN 01/31/12 @1515  BY WOOTEN K   Culture  Final    Value: MULTIPLE ORGANISMS PRESENT, NONE PREDOMINANT     Note: NO STAPHYLOCOCCUS AUREUS ISOLATED NO GROUP A STREP (S.PYOGENES) ISOLATED   Report Status 02/03/2012 FINAL   Final   GRAM STAIN     Status: Normal   Collection Time   01/31/12  2:15 PM      Component Value Range Status Comment   Specimen Description ABSCESS RIGHT BACK   Final    Special Requests NONE   Final    Gram Stain     Final    Value: RARE WBC PRESENT, PREDOMINANTLY MONONUCLEAR     ABUNDANT GRAM POSITIVE COCCI IN PAIRS     CALLED TO ALLEN,J RN 01/31/12 1515 WOOTEN,K   Report Status 01/31/2012 FINAL   Final     Anti-infectives     Start     Dose/Rate Route Frequency Ordered Stop   02/02/12 2000   levofloxacin (LEVAQUIN) IVPB 500 mg        500 mg 100 mL/hr over 60 Minutes Intravenous Every 48 hours 01/31/12 1832     02/02/12 1400   vancomycin (VANCOCIN) IVPB 1000 mg/200 mL premix        1,000 mg 200  mL/hr over 60 Minutes Intravenous Every 48 hours 01/31/12 1832     01/31/12 2000   levofloxacin (LEVAQUIN) IVPB 750 mg        750 mg 100 mL/hr over 90 Minutes Intravenous  Once 01/31/12 1832 02/01/12 0215   01/31/12 1345   vancomycin (VANCOCIN) 1,000 mg in sodium chloride 0.9 % 250 mL IVPB        1,000 mg 250 mL/hr over 60 Minutes Intravenous To Surgery 01/31/12 1343 01/31/12 1352   01/31/12 1345   cefTRIAXone (ROCEPHIN) 1 g in dextrose 5 % 50 mL IVPB        1 g 120 mL/hr over 30 Minutes Intravenous To Surgery 01/31/12 1343 01/31/12 1448   01/31/12 1100   ceFAZolin (ANCEF) IVPB 2 g/50 mL premix  Status:  Discontinued        2 g 100 mL/hr over 30 Minutes Intravenous  Once 01/30/12 2028 01/31/12 1345   01/30/12 2200   valACYclovir (VALTREX) tablet 1,000 mg        1,000 mg Oral Every 24 hours 01/30/12 2035            Assessment: 79 yo F admitted 1/25 after a fall and R hip pain. Pt was taken to the OR 1/26 THA. Pt also has an infected sebaceous cyst on her backside which was debrided in the OR today. To begin empiric antibiotic coverage with Vanc and Levaquin.  Anticoagulation: Lovenox 30mg /24h x 21 days. Weight 47kg, CrCl 20. Watch Hgb and platelets.  Infectious Disease: Infected cyst on backside. Afebrile. WBC 7.9. Scr 1.44 (CrCl 20). Takes Valtrex TID from home (shingles). See MD sticky note about Valtrex LOT. GPC on gram stain 1/26 Levaquin>> 1/26 Vanco>>  1/26: R back abscess cx: GPC in pairs, no predominant organisms, no staph aureus  Cardiovascular: CAD, HLD, 136/66, HR 83 on Norvasc, Lipitor, Toprol  Endocrinology: Glucose 95 on BMET  Gastrointestinal / Nutrition:Caltrate, Vit D  Neurology: h/o CVA, Shingles on Valtrex, Namenda, Zoloft. Confused with dementia and h/o CVA  Nephrology: Scr 1.44  Pulmonary: R lung nodule  Hematology / Oncology: Hgb dropped to 8.2. Plts 211.  PTA Medication Issues: ASA  Best Practices: SQ heparin  Goal of Therapy:  Vancomycin  trough level 10-15 mcg/ml Eradication of infection  Goal of  Therapy:  Vancomycin trough level 10-15 mcg/ml  Plan:  Vancomycin 1gm IV q48 hours. Trough due after 3-5 doses at steady state.   Levaquin 500 mg IV q48h.    Keri Tavella S. Merilynn Finland, PharmD, BCPS Clinical Staff Pharmacist Pager [phone]  Misty Stanley Stillinger 02/03/2012,10:28 AM

## 2012-02-03 NOTE — Progress Notes (Signed)
[facility]  Follow up on cultures from back/buttock abscess does not demonstrate any specific bacteria. It did show gram + cocci in pairs with multiple organisms present (see below)  Results   Culture, routine-abscess (Order [id number])      Culture, routine-abscess      Status: Final result   MyChart: Not Released          Component  Value     Specimen Description  ABSCESS RIGHT BACK     Special Requests  NONE     Gram Stain  RARE WBC PRESENT,BOTH PMN AND MONONUCLEAR ABUNDANT GRAM POSITIVE COCCI IN PAIRS Gram Stain Report Called to,Read Back By and Verified With: Gram Stain Report Called to,Read Back By and Verified With: ALLEN J RN 01/31/12 @1515  BY WOOTEN K     Culture  MULTIPLE ORGANISMS PRESENT, NONE PREDOMINANT Note: NO STAPHYLOCOCCUS AUREUS ISOLATED NO GROUP A STREP (S.PYOGENES) ISOLATED     Report Status  02/03/2012 FINAL     Resulting Agency  [facility]     Specimen Collected: 01/31/12  2:15 PM  Last Resulted: 02/03/12  8:17 AM       As pt is s/p R hip hemi, we would like to continue abx coverage.  Will d/c IV abx and change to doxycycline 100 mg po q 12 hours x 10 days and Levaquin 500 mg q 48 hours x 10 days.    Ortho issues remain stable Pt ready for snf once bed available from ortho standpoint  Mearl Latin, PA-C [facility] [phone] (P) 02/03/2012 1:36 PM

## 2012-02-03 NOTE — Progress Notes (Signed)
Subjective:  The patient complains of mild hip pain. Denies any chest pain or shortness of breath  Objective:  Vital Signs in the last 24 hours: Temp:  [98.1 F (36.7 C)-98.7 F (37.1 C)] 98.2 F (36.8 C) (01/29 0423) Pulse Rate:  [78-83] 83  (01/29 0423) Resp:  [16-18] 16  (01/29 0800) BP: (128-136)/(59-66) 136/66 mmHg (01/29 0423) SpO2:  [98 %-99 %] 99 % (01/29 0800) Weight:  [47 kg (103 lb 9.9 oz)] 47 kg (103 lb 9.9 oz) (01/29 0423)  Intake/Output from previous day: 01/28 0701 - 01/29 0700 In: 120 [P.O.:120] Out: 675 [Urine:675] Intake/Output from this shift:    Physical Exam: Neck: no adenopathy, no carotid bruit, no JVD and supple, symmetrical, trachea midline Lungs: clear to auscultation bilaterally Heart: regular rate and rhythm, S1, S2 normal and Soft systolic murmur noted no S3 gallop Abdomen: soft, non-tender; bowel sounds normal; no masses,  no organomegaly Extremities: extremities normal, atraumatic, no cyanosis or edema and Surgical site dry  Lab Results:  Basename 02/02/12 0630 02/01/12 0705  WBC 7.9 8.0  HGB 8.2* 8.2*  PLT 211 178    Basename 02/02/12 0630 02/01/12 0705  NA 139 136  K 3.8 4.3  CL 107 107  CO2 20 15*  GLUCOSE 95 80  BUN 24* 23  CREATININE 1.44* 1.52*   No results found for this basename: TROPONINI:2,CK,MB:2 in the last 72 hours Hepatic Function Panel  Basename 02/02/12 0630  PROT --  ALBUMIN 2.8*  AST --  ALT --  ALKPHOS --  BILITOT --  BILIDIR --  IBILI --   No results found for this basename: CHOL in the last 72 hours No results found for this basename: PROTIME in the last 72 hours  Imaging: Imaging results have been reviewed and No results found.  Cardiac Studies:  Assessment/Plan:  Status post right femoral neck fracture status post right hip hemiarthroplasty  Dementia  Right lung nodule  History of CVA  Hypercholesteremia  CAD  Postop anemia stable  Status post right sebaceous buttock cyst Plan  Continue  present management Will DC Vanco and Levaquin once okay with surgery   LOS: 4 days    Annette Cantrell 02/03/2012, 9:28 AM

## 2012-02-04 MED ORDER — HYDROCODONE-ACETAMINOPHEN 5-325 MG PO TABS: 1.0000 | ORAL_TABLET | Freq: Four times a day (QID) | ORAL | Status: DC | PRN

## 2012-02-04 MED ORDER — DOXYCYCLINE HYCLATE 100 MG PO TABS: 100.0000 mg | ORAL_TABLET | Freq: Two times a day (BID) | ORAL | Status: DC

## 2012-02-04 MED ORDER — LEVOFLOXACIN 500 MG PO TABS: 500.0000 mg | ORAL_TABLET | ORAL | Status: DC

## 2012-02-04 NOTE — Progress Notes (Signed)
1730 transported to [facility] via [facility] . famil in attendance

## 2012-02-04 NOTE — Progress Notes (Signed)
Physical Therapy Treatment Patient Details Name: Annette Cantrell MRN: [id number] DOB: September 26, 1922 Today's Date: 02/04/2012 Time: 4098-1191 PT Time Calculation (min): 27 min  PT Assessment / Plan / Recommendation Comments on Treatment Session  Adm s/p fall with right femur fx and s/p hemiarthroplasty. Confusion persists but does know she is at the hospital today. Needs encouragement to progress.     Follow Up Recommendations  SNF     Does the patient have the potential to tolerate intense rehabilitation     Barriers to Discharge        Equipment Recommendations  Rolling walker with 5" wheels    Recommendations for Other Services    Frequency Min 5X/week   Plan Discharge plan remains appropriate;Frequency remains appropriate    Precautions / Restrictions Precautions Precautions: Fall;Posterior Hip Restrictions RLE Weight Bearing: Weight bearing as tolerated   Pertinent Vitals/Pain Facial grimace with ambulation, do not think pt understands why she is hurting, sitting and rest appears to resolve pain    Mobility  Bed Mobility Bed Mobility: Supine to Sit Supine to Sit: 3: Mod assist;HOB elevated (20 degrees) Sitting - Scoot to Edge of Bed: 3: Mod assist (use of pad to scoot) Details for Bed Mobility Assistance: sequencing cues, assist to maintain hip precautions, facilitation for scooting hips with use of pad Transfers Transfers: Sit to Stand;Stand to Sit Sit to Stand: 4: Min assist;With upper extremity assist;From bed Stand to Sit: 4: Min assist;To chair/3-in-1;With upper extremity assist;With armrests Details for Transfer Assistance: cues for hand placement, facilitation to maintain precautions, follow through for tall posture and stability as well as to control descent to chair Ambulation/Gait Ambulation/Gait Assistance: 4: Min guard Ambulation Distance (Feet): 20 Feet Assistive device: Rolling walker Ambulation/Gait Assistance Details: much encouragement to progress  ambulation distance, cues for tall posture and sequencing for stepping with RW Gait Pattern: Trunk flexed;Step-to pattern;Decreased stance time - right;Decreased step length - right    Exercises General Exercises - Lower Extremity Ankle Circles/Pumps: AROM;Both;10 reps;Supine Quad Sets: AAROM;Right;10 reps;Supine Heel Slides: Right;10 reps;Supine;AAROM Hip ABduction/ADduction: AAROM;Right;10 reps;Supine     PT Goals Acute Rehab PT Goals PT Goal: Supine/Side to Sit - Progress: Progressing toward goal Pt will go Sit to Stand: with supervision;with upper extremity assist PT Goal: Sit to Stand - Progress: Updated due to goal met Pt will go Stand to Sit: with supervision;with upper extremity assist PT Goal: Stand to Sit - Progress: Updated due to goals met PT Goal: Ambulate - Progress: Progressing toward goal  Visit Information  Last PT Received On: 02/04/12 Assistance Needed: +1    Subjective Data  Subjective: This is not human (with regards to walking because it is painful).    Cognition  Overall Cognitive Status: History of cognitive impairments - at baseline Area of Impairment: Memory Arousal/Alertness: Awake/alert Memory: Decreased recall of precautions Memory Deficits: doesn't remember why she is here    Balance     End of Session PT - End of Session Equipment Utilized During Treatment: Gait belt Activity Tolerance: Patient tolerated treatment well Patient left: in chair;with call bell/phone within reach Nurse Communication: Mobility status   GP     [facility] HELEN 02/04/2012, 9:33 AM

## 2012-02-04 NOTE — Discharge Summary (Signed)
Annette Cantrell, Annette Cantrell            ACCOUNT NO.:  [id number]  MEDICAL RECORD NO.:  [id number]  LOCATION:  4705                         FACILITY:  [facility]  PHYSICIAN:  Eduardo Osier. Sharyn Lull, M.D. DATE OF BIRTH:  Nov 18, 1922  DATE OF ADMISSION:  01/30/2012 DATE OF DISCHARGE:  02/04/2012                              DISCHARGE SUMMARY   ADMITTING DIAGNOSES: 1. Right femoral neck fracture. 2. Dementia. 3. Stroke. 4. Hyperlipidemia. 5. Right lung nodule. 6. History of cerebrovascular accident.  DISCHARGE DIAGNOSES: 1. Status post right femoral neck fracture, status post right hip     arthroplasty/status post right sebaceous buttock cyst resection. 2. Right lung nodule. 3. History of cerebrovascular accident. 4. Coronary artery disease. 5. Hypercholesteremia. 6. Dementia. 7. Postoperative anemia, stable.  DISCHARGE MEDICATIONS:  Tylenol 1-2 tablets every 6 hours as needed for pain.  Calcium citrate 950 mg 1 tablet 2 times daily, doxycycline 100 mg 1 tablet twice daily for 10 days, Lovenox 30 mg subcu daily, Vicodin 1 tablet every 6 hours as needed for pain, Levaquin 500 mg every other day for 2 weeks, vitamin D 50,000 units capsule every week, amlodipine 5 mg daily, enteric-coated aspirin 81 mg 1 tablet daily, atorvastatin 10 mg 1 tablet daily, Namenda 5 mg 1 tablet daily, Toprol-XL 25 mg 1 tablet daily, Zoloft 25 mg 1 tablet daily.  DIET:  Low salt, low cholesterol as tolerated.  ACTIVITY:  Increase activity slowly as tolerated.  Follow up with Dr. Carola Frost, orthopedic surgeon, in 2 weeks.  Follow up with me in 2-3 weeks.  CONDITION AT DISCHARGE:  Stable.  Postop instructions have been given.  The patient will be transferred to [facility].  BRIEF HISTORY AND HOSPITAL COURSE:  [name] is an 79 year old female with past medical history significant for dementia, CVA, heart disease, history of right lung nodule, hypercholesteremia came to the ER by  EMS complaining of right hip pain.  The patient lives with her daughter at home.  Her daughter noted that patient was limping and complaining of pain in her right hip.  EMS was called.  Initially, the patient refused to come to the ER.  EMS was called back to home 15 minute later because patient had worsening right hip pain.  Pain initially reported as 10/10. EMS decided to stabilize her hip using fracture protocol, gave the patient 150 mcg of fentanyl.  The patient subsequently was pain free after receiving pain medication.  History is limited due to her history of dementia.  PHYSICAL EXAMINATION:  VITAL SIGNS:  Her blood pressure was 177/68, pulse was 72, she was afebrile, O2 sats were 99%.  Conjunctivae was pink. NECK:  Supple.  No JVD. LUNGS:  Clear to auscultation without rhonchi or rales. CARDIOVASCULAR:  S1, S2 was normal.  There was no S3 gallop. EXTREMITIES:  There was tenderness in the right hip region with flexion, decrease range of motion due to pain, mild shortening of the right leg without external rotation.  Pedal pulses were present.  No foot drop. Left hip was okay.  LABORATORY DATA:  Her sodium was 140, potassium 3.4, BUN 24, creatinine 1.30.  Hemoglobin was 11, hematocrit 32.4, white count of 11.2 with left  shift.  Yesterday around 1:28, sodium 139, potassium 3.8, BUN 24, creatinine 1.44.  Hemoglobin today is 9.4, hematocrit 29, white count of 9.4.  Cultures from the sebaceous cyst/abscess that revealed gram positive cocci.  X-ray of the hip showed displaced fracture of the right femoral neck.  Chest x-ray showed suboptimal inspiration, basilar atelectasis, no acute cardiopulmonary disease noted.  Stable right upper lobe scarring.  2D echo showed good LV systolic function, mild LVH, EF of 55-60%.  There was mild pulmonic tricuspid regurgitation.  BRIEF HOSPITAL COURSE:  The patient was admitted to telemetry unit. Orthopedic consultation was called with Dr. Carola Frost.   The patient subsequently underwent right hip arthroplasty and incision and drainage of sebaceous cyst, right lower back.  The patient tolerated procedure well.  OT/PT consultation was called.  The patient has been ambulating in the room with rehab.  Her right hip surgical dressing site is clear with no evidence of hematoma or any discharge.  The patient will be transferred to [facility] and will be followed up by Dr. Carola Frost in 2 weeks and in my office in 2-3 weeks.     Eduardo Osier. Sharyn Lull, M.D.     [facility]/MEDQ  D:  02/04/2012  T:  02/04/2012  Job:  [id number]

## 2012-02-04 NOTE — Progress Notes (Signed)
`  1310 Report given to Lurena Joiner , nurse [facility]

## 2012-02-04 NOTE — Discharge Summary (Signed)
  Prior to discharge summary dictated on 02/04/2012 dictation number is [phone]

## 2012-02-04 NOTE — Clinical Social Work Psychosocial (Addendum)
    Clinical Social Work Department BRIEF PSYCHOSOCIAL ASSESSMENT 02/04/2012  Patient:  Annette Cantrell, Annette Cantrell     Account Number:  [id number]     Admit date:  01/30/2012  Clinical Social Worker:  Tiburcio Pea  Date/Time:  02/02/2012 04:00 PM  Referred by:  Physician  Date Referred:  02/02/2012 Referred for  SNF Placement   Other Referral:   Interview type:  Patient Other interview type:   Patient is alert but confused. Unable to provide history.    PSYCHOSOCIAL DATA Living Status:  WITH ADULT CHILDREN Admitted from facility:   Level of care:   Primary support name:  Cristi Loron Primary support relationship to patient:  CHILD, ADULT Degree of support available:   Good support   (Information derived from chart and nursing). CSW is attempting to reach daughter.    CURRENT CONCERNS Current Concerns  Post-Acute Placement   Other Concerns:   Patient is confused. Unable to provide history of d/c plan.    SOCIAL WORK ASSESSMENT / PLAN CSW was referred to assist patient with short term SNF. Patient was sitting up in bed- very pleasant and alert. Patient stated "I live in [place], [place] but I don't know how I got here.  Can you tell me?"  Patient stated that she was "confused"- could not remember names of family or where she is living or with whom.  Nursing reports that patient lives with her daughter Britta Mccreedy. CSW is attempting to reach daughter or any other family.  Patient has a fractured femoral neck; SNF placement is anticipated.  Fl2 will be completed for MD's signature.   Assessment/plan status:  Psychosocial Support/Ongoing Assessment of Needs Other assessment/ plan:   Information/referral to community resources:   SNF bed list left in patient's room; patient unable to review due to confusion.  CSW left name and phone number in case family visited.  Message left for patient's daughter Britta Mccreedy to call back regarding d/c plan.    PATIENT'S/FAMILY'S RESPONSE TO PLAN  OF CARE: Patient is alert but confused. Unable to participate in d/c planning. She cannot relate who she lives with or where she lives; does not know the names of any family at this time. CSW provided support and reassurance and related to patient that attempts would be made to locate her family. She stated "this would be best."  CSW will monitor.

## 2012-02-04 NOTE — Clinical Social Work Placement (Addendum)
    Clinical Social Work Department CLINICAL SOCIAL WORK PLACEMENT NOTE 02/04/2012  Patient:  Annette Cantrell, Annette Cantrell  Account Number:  [id number] Admit date:  01/30/2012  Clinical Social Worker:  Lupita Leash Deran Barro, LCSWA  Date/time:  02/03/2012 05:00 PM  Clinical Social Work is seeking post-discharge placement for this patient at the following level of care:   SKILLED NURSING   (*CSW will update this form in Epic as items are completed)   02/02/2012  Patient/family provided with [facility] Department of Clinical Social Work's list of facilities offering this level of care within the geographic area requested by the patient (or if unable, by the patient's family).  02/03/2012  Patient/family informed of their freedom to choose among providers that offer the needed level of care, that participate in Medicare, Medicaid or managed care program needed by the patient, have an available bed and are willing to accept the patient.  02/03/2012  Patient/family informed of [facility]' ownership interest in [facility], as well as of the fact that they are under no obligation to receive care at this facility.  PASARR submitted to EDS on 02/04/12 PASARR number received from EDS on  02/04/12  FL2 transmitted to all facilities in geographic area requested by pt/family on  02/03/2012 FL2 transmitted to all facilities within larger geographic area on   Patient informed that his/her managed care company has contracts with or will negotiate with  certain facilities, including the following:   NA     Patient/family informed of bed offers received: 02/04/12  Patient chooses bed at [facility] Physician recommends and patient chooses bed at    Patient to be transferred to [facility] on  02/04/12 Patient to be transferred to facility by Ambulance  [facility])  The following physician request were entered in Epic:   Additional Comments: Met with patient, daughter Britta Mccreedy and  granddaughter Engineer, manufacturing. They have chosen [facility] for short term SNF. Ok per [employer], Admissions at [facility]- bed will be ready. Notifed patient's nurse- Pattricia Boss of d/c plan. No further CSW needs identified. CSW signing off.  Lorri Frederick. [place]  [phone]

## 2012-02-05 LAB — ANAEROBIC CULTURE

## 2012-02-08 ENCOUNTER — Ambulatory Visit: Payer: Medicare Other | Admitting: Internal Medicine

## 2012-03-22 LAB — AFB CULTURE WITH SMEAR (NOT AT ARMC): Acid Fast Smear: NONE SEEN

## 2012-08-25 ENCOUNTER — Inpatient Hospital Stay (HOSPITAL_COMMUNITY)
Admission: EM | Admit: 2012-08-25 | Discharge: 2012-08-30 | DRG: 190 | Disposition: A | Discharge status: Skilled Nursing Facility | Payer: Medicare Other | Attending: Cardiology | Admitting: Cardiology

## 2012-08-25 ENCOUNTER — Emergency Department (HOSPITAL_COMMUNITY): Payer: Medicare Other

## 2012-08-25 ENCOUNTER — Encounter (HOSPITAL_COMMUNITY): Payer: Self-pay

## 2012-08-25 DIAGNOSIS — R627 Adult failure to thrive: Secondary | ICD-10-CM | POA: Diagnosis present

## 2012-08-25 DIAGNOSIS — J441 Chronic obstructive pulmonary disease with (acute) exacerbation: Principal | ICD-10-CM | POA: Diagnosis present

## 2012-08-25 DIAGNOSIS — F17200 Nicotine dependence, unspecified, uncomplicated: Secondary | ICD-10-CM | POA: Diagnosis present

## 2012-08-25 DIAGNOSIS — Z85118 Personal history of other malignant neoplasm of bronchus and lung: Secondary | ICD-10-CM

## 2012-08-25 DIAGNOSIS — E7800 Pure hypercholesterolemia, unspecified: Secondary | ICD-10-CM | POA: Diagnosis present

## 2012-08-25 DIAGNOSIS — J189 Pneumonia, unspecified organism: Secondary | ICD-10-CM | POA: Diagnosis present

## 2012-08-25 DIAGNOSIS — I252 Old myocardial infarction: Secondary | ICD-10-CM

## 2012-08-25 DIAGNOSIS — I2510 Atherosclerotic heart disease of native coronary artery without angina pectoris: Secondary | ICD-10-CM | POA: Diagnosis present

## 2012-08-25 DIAGNOSIS — I10 Essential (primary) hypertension: Secondary | ICD-10-CM | POA: Diagnosis present

## 2012-08-25 DIAGNOSIS — R0902 Hypoxemia: Secondary | ICD-10-CM | POA: Diagnosis present

## 2012-08-25 DIAGNOSIS — R5381 Other malaise: Secondary | ICD-10-CM | POA: Diagnosis present

## 2012-08-25 DIAGNOSIS — F0390 Unspecified dementia without behavioral disturbance: Secondary | ICD-10-CM | POA: Diagnosis present

## 2012-08-25 DIAGNOSIS — E46 Unspecified protein-calorie malnutrition: Secondary | ICD-10-CM | POA: Diagnosis present

## 2012-08-25 DIAGNOSIS — E876 Hypokalemia: Secondary | ICD-10-CM | POA: Diagnosis present

## 2012-08-25 DIAGNOSIS — Z681 Body mass index (BMI) 19 or less, adult: Secondary | ICD-10-CM

## 2012-08-25 DIAGNOSIS — Z8673 Personal history of transient ischemic attack (TIA), and cerebral infarction without residual deficits: Secondary | ICD-10-CM

## 2012-08-25 LAB — CBC WITH DIFFERENTIAL/PLATELET
Basophils Relative: 0 % (ref 0–1)
HCT: 36.8 % (ref 36.0–46.0)
Hemoglobin: 12.2 g/dL (ref 12.0–15.0)
Lymphocytes Relative: 42 % (ref 12–46)
Lymphs Abs: 1.9 10*3/uL (ref 0.7–4.0)
MCHC: 33.2 g/dL (ref 30.0–36.0)
Monocytes Absolute: 0.3 10*3/uL (ref 0.1–1.0)
Monocytes Relative: 7 % (ref 3–12)
Neutro Abs: 1.7 10*3/uL (ref 1.7–7.7)
Neutrophils Relative %: 38 % — ABNORMAL LOW (ref 43–77)
RBC: 3.68 MIL/uL — ABNORMAL LOW (ref 3.87–5.11)
WBC: 4.6 10*3/uL (ref 4.0–10.5)

## 2012-08-25 LAB — URINALYSIS, ROUTINE W REFLEX MICROSCOPIC
Bilirubin Urine: NEGATIVE
Ketones, ur: NEGATIVE mg/dL
Leukocytes, UA: NEGATIVE
Nitrite: NEGATIVE
Protein, ur: NEGATIVE mg/dL
pH: 6.5 (ref 5.0–8.0)

## 2012-08-25 LAB — POCT I-STAT TROPONIN I

## 2012-08-25 LAB — COMPREHENSIVE METABOLIC PANEL
Albumin: 2.9 g/dL — ABNORMAL LOW (ref 3.5–5.2)
Alkaline Phosphatase: 69 U/L (ref 39–117)
BUN: 25 mg/dL — ABNORMAL HIGH (ref 6–23)
CO2: 26 mEq/L (ref 19–32)
Chloride: 108 mEq/L (ref 96–112)
Creatinine, Ser: 1.17 mg/dL — ABNORMAL HIGH (ref 0.50–1.10)
GFR calc non Af Amer: 41 mL/min — ABNORMAL LOW (ref >90)
Glucose, Bld: 77 mg/dL (ref 70–99)
Potassium: 4 mEq/L (ref 3.5–5.1)
Total Bilirubin: 0.2 mg/dL — ABNORMAL LOW (ref 0.3–1.2)

## 2012-08-25 LAB — LIPASE, BLOOD: Lipase: 24 U/L (ref 11–59)

## 2012-08-25 MED ORDER — ENSURE COMPLETE PO LIQD
237.0000 mL | Freq: Three times a day (TID) | ORAL | Status: DC
  Administered 2012-08-26: 237 mL via ORAL
  Filled 2012-08-25: qty 237

## 2012-08-25 MED ORDER — ALBUTEROL SULFATE (5 MG/ML) 0.5% IN NEBU
5.0000 mg | INHALATION_SOLUTION | Freq: Once | RESPIRATORY_TRACT | Status: AC
  Administered 2012-08-25: 5 mg via RESPIRATORY_TRACT
  Filled 2012-08-25: qty 1

## 2012-08-25 MED ORDER — VITAMIN D (ERGOCALCIFEROL) 1.25 MG (50000 UNIT) PO CAPS
50000.0000 [IU] | ORAL_CAPSULE | ORAL | Status: DC
  Administered 2012-08-29: 50000 [IU] via ORAL
  Filled 2012-08-25: qty 1

## 2012-08-25 MED ORDER — MEMANTINE HCL ER 14 MG PO CP24
14.0000 mg | ORAL_CAPSULE | Freq: Every day | ORAL | Status: DC
  Filled 2012-08-25: qty 1

## 2012-08-25 MED ORDER — TIOTROPIUM BROMIDE MONOHYDRATE 18 MCG IN CAPS
18.0000 ug | ORAL_CAPSULE | Freq: Every day | RESPIRATORY_TRACT | Status: DC
  Filled 2012-08-25: qty 5

## 2012-08-25 MED ORDER — FERROUS SULFATE 325 (65 FE) MG PO TABS
325.0000 mg | ORAL_TABLET | Freq: Every day | ORAL | Status: DC
  Administered 2012-08-27 – 2012-08-30 (×4): 325 mg via ORAL
  Filled 2012-08-25 (×6): qty 1

## 2012-08-25 MED ORDER — METHYLPREDNISOLONE SODIUM SUCC 500 MG IJ SOLR
60.0000 mg | Freq: Four times a day (QID) | INTRAMUSCULAR | Status: DC
  Administered 2012-08-26 (×2): 60 mg via INTRAVENOUS
  Filled 2012-08-25 (×6): qty 60

## 2012-08-25 MED ORDER — PANTOPRAZOLE SODIUM 40 MG PO TBEC
40.0000 mg | DELAYED_RELEASE_TABLET | Freq: Every day | ORAL | Status: DC
  Administered 2012-08-26 – 2012-08-29 (×2): 40 mg via ORAL
  Filled 2012-08-25 (×6): qty 1

## 2012-08-25 MED ORDER — LEVALBUTEROL HCL 0.63 MG/3ML IN NEBU
0.6300 mg | INHALATION_SOLUTION | Freq: Four times a day (QID) | RESPIRATORY_TRACT | Status: DC
  Administered 2012-08-26 (×3): 0.63 mg via RESPIRATORY_TRACT
  Filled 2012-08-25 (×7): qty 3

## 2012-08-25 MED ORDER — ASPIRIN EC 81 MG PO TBEC
81.0000 mg | DELAYED_RELEASE_TABLET | Freq: Every day | ORAL | Status: DC
  Administered 2012-08-26 – 2012-08-30 (×5): 81 mg via ORAL
  Filled 2012-08-25 (×5): qty 1

## 2012-08-25 MED ORDER — DEXTROSE 5 % IV SOLN
1.0000 g | Freq: Every day | INTRAVENOUS | Status: DC
  Administered 2012-08-26 – 2012-08-29 (×5): 1 g via INTRAVENOUS
  Filled 2012-08-25 (×6): qty 10

## 2012-08-25 MED ORDER — ATORVASTATIN CALCIUM 10 MG PO TABS
10.0000 mg | ORAL_TABLET | Freq: Every day | ORAL | Status: DC
  Administered 2012-08-26 – 2012-08-28 (×3): 10 mg via ORAL
  Filled 2012-08-25 (×5): qty 1

## 2012-08-25 MED ORDER — DEXTROSE 5 % IV SOLN
500.0000 mg | INTRAVENOUS | Status: DC
  Filled 2012-08-25: qty 500

## 2012-08-25 MED ORDER — METHYLPREDNISOLONE SODIUM SUCC 125 MG IJ SOLR
125.0000 mg | INTRAMUSCULAR | Status: AC
  Administered 2012-08-25: 125 mg via INTRAVENOUS
  Filled 2012-08-25: qty 2

## 2012-08-25 MED ORDER — METOPROLOL SUCCINATE ER 25 MG PO TB24
25.0000 mg | ORAL_TABLET | Freq: Every day | ORAL | Status: DC
  Administered 2012-08-26 – 2012-08-30 (×5): 25 mg via ORAL
  Filled 2012-08-25 (×5): qty 1

## 2012-08-25 MED ORDER — HEPARIN SODIUM (PORCINE) 5000 UNIT/ML IJ SOLN
5000.0000 [IU] | Freq: Three times a day (TID) | INTRAMUSCULAR | Status: DC
  Administered 2012-08-26 – 2012-08-29 (×5): 5000 [IU] via SUBCUTANEOUS
  Filled 2012-08-25 (×17): qty 1

## 2012-08-25 MED ORDER — DIVALPROEX SODIUM 125 MG PO CPSP
250.0000 mg | ORAL_CAPSULE | Freq: Three times a day (TID) | ORAL | Status: DC
  Administered 2012-08-26 – 2012-08-30 (×12): 250 mg via ORAL
  Filled 2012-08-25 (×16): qty 2

## 2012-08-25 MED ORDER — AMLODIPINE BESYLATE 5 MG PO TABS
5.0000 mg | ORAL_TABLET | Freq: Every day | ORAL | Status: DC
  Administered 2012-08-26 – 2012-08-30 (×5): 5 mg via ORAL
  Filled 2012-08-25 (×5): qty 1

## 2012-08-25 MED ORDER — SERTRALINE HCL 25 MG PO TABS
25.0000 mg | ORAL_TABLET | Freq: Every day | ORAL | Status: DC
  Administered 2012-08-26 – 2012-08-30 (×5): 25 mg via ORAL
  Filled 2012-08-25 (×6): qty 1

## 2012-08-25 MED ORDER — LORAZEPAM 0.5 MG PO TABS
0.5000 mg | ORAL_TABLET | Freq: Three times a day (TID) | ORAL | Status: DC
  Administered 2012-08-26: 0.5 mg via ORAL
  Filled 2012-08-25: qty 1

## 2012-08-25 MED ORDER — SODIUM CHLORIDE 0.9 % IV BOLUS (SEPSIS)
1000.0000 mL | Freq: Once | INTRAVENOUS | Status: AC
  Administered 2012-08-25: 1000 mL via INTRAVENOUS

## 2012-08-25 MED ORDER — DEXTROSE 5 % IV SOLN
500.0000 mg | Freq: Once | INTRAVENOUS | Status: AC
  Administered 2012-08-25: 500 mg via INTRAVENOUS
  Filled 2012-08-25: qty 500

## 2012-08-25 MED ORDER — SODIUM CHLORIDE 0.9 % IV SOLN
INTRAVENOUS | Status: DC
  Administered 2012-08-25: 22:00:00 via INTRAVENOUS

## 2012-08-25 NOTE — ED Provider Notes (Signed)
CSN: [id number]     Arrival date & time 08/25/12  1521 History     First MD Initiated Contact with Patient 08/25/12 1538     Chief Complaint  Patient presents with  . Weakness   (Consider location/radiation/quality/duration/timing/severity/associated sxs/prior Treatment) HPI Patient presents from assisted living with concern of generalized weakness, decreased interactivity, decreased ambulation. Patient history of dementia which limits the history of present illness. Per report the patient has had 2 days of the aforementioned symptoms.  There is also report of anorexia. Patient herself complains of when her abdominal pain, states that she feels weak, denies other pain, denies fever, denies nausea, denies confusion or disorientation. No clear precipitant, no clear alleviating or exacerbating factors. Per report the patient has malodorous urine. Past Medical History  Diagnosis Date  . Dementia   . Stroke   . High cholesterol   . Heart disease, unspecified   . LUNG NODULE 03/30/2008    Qualifier: Diagnosis of  By: [name] MD, [name]   . COPD 03/30/2008    Qualifier: Diagnosis of  By: [name] MD, [name]   . Infected sebaceous cyst 01/25/2012  . Herpes zoster 01/30/2012  . History of positive PPD, untreated    Past Surgical History  Procedure Laterality Date  . Hip arthroplasty  01/31/2012    Procedure: ARTHROPLASTY BIPOLAR HIP;  Surgeon: [name], MD;  Location: [facility] OR;  Service: Orthopedics;  Laterality: Right;   Family History  Problem Relation Age of Onset  . Stroke Mother    History  Substance Use Topics  . Smoking status: Current Every Day Smoker -- 0.20 packs/day    Types: Cigarettes  . Smokeless tobacco: Not on file  . Alcohol Use: No   OB History   Grav Para Term Preterm Abortions TAB SAB Ect Mult Living                 Review of Systems  Unable to perform ROS: Dementia    Allergies  Review of patient's allergies indicates no known allergies.  Home  Medications   Current Outpatient Rx  Name  Route  Sig  Dispense  Refill  . acetaminophen (TYLENOL) 325 MG tablet   Oral   Take 1-2 tablets (325-650 mg total) by mouth every 6 (six) hours as needed for pain (or Fever >/= 101).         Marland Kitchen amLODipine (NORVASC) 5 MG tablet   Oral   Take 5 mg by mouth daily.         Marland Kitchen aspirin EC 81 MG tablet   Oral   Take 81 mg by mouth daily.         Marland Kitchen atorvastatin (LIPITOR) 10 MG tablet   Oral   Take 10 mg by mouth daily.         . divalproex (DEPAKOTE SPRINKLE) 125 MG capsule   Oral   Take 250 mg by mouth 3 (three) times daily.         . ferrous sulfate 325 (65 FE) MG tablet   Oral   Take 325 mg by mouth daily with breakfast.         . HYDROcodone-acetaminophen (NORCO/VICODIN) 5-325 MG per tablet   Oral   Take 1 tablet by mouth every 6 (six) hours as needed.   60 tablet   0   . LORazepam (ATIVAN) 0.5 MG tablet   Oral   Take 0.5 mg by mouth 3 (three) times daily.         Marland Kitchen  Memantine HCl ER (NAMENDA XR) 14 MG CP24   Oral   Take 14 mg by mouth daily.         . metoprolol succinate (TOPROL-XL) 25 MG 24 hr tablet   Oral   Take 25 mg by mouth daily.         . sertraline (ZOLOFT) 25 MG tablet   Oral   Take 25 mg by mouth daily.         . shark liver oil-cocoa butter (QC HEMORRHOIDAL) 0.25-3-85.5 % suppository   Rectal   Place 1 suppository rectally at bedtime.         . Vitamin D, Ergocalciferol, (DRISDOL) 50000 UNITS CAPS   Oral   Take 1 capsule (50,000 Units total) by mouth every 7 (seven) days.   8 capsule   0    BP 130/67  Pulse 62  Temp(Src) 98.4 F (36.9 C) (Oral)  Resp 14  SpO2 92% Physical Exam  Nursing note and vitals reviewed. Constitutional: She appears listless. She appears cachectic. She has a sickly appearance. No distress.  HENT:  Head: Normocephalic and atraumatic.  Mouth/Throat: Mucous membranes are not pale and dry.  Eyes: Conjunctivae and EOM are normal.  Cardiovascular: Normal  rate and regular rhythm.   Pulmonary/Chest: Effort normal. No stridor. No respiratory distress. She has wheezes.  Mild expiratory wheeze throughout  Abdominal: She exhibits no distension.  Musculoskeletal: She exhibits no edema.  Neurological: She appears listless. She displays atrophy. She displays no tremor. No cranial nerve deficit. She exhibits abnormal muscle tone. She displays no seizure activity.  No facial asymmetry No slurring Patient is 3-4/5 strength in all extremities - no no asym.  Skin: Skin is warm and dry.  Psychiatric: She has a normal mood and affect.    ED Course   Procedures (including critical care time)  Labs Reviewed  CBC WITH DIFFERENTIAL  COMPREHENSIVE METABOLIC PANEL  LIPASE, BLOOD  URINALYSIS, ROUTINE W REFLEX MICROSCOPIC  LACTIC ACID, PLASMA   No results found. No diagnosis found. Pulse oximetry 92% room air abnormal  Cardiac: 90 sr, normal   Date: 08/25/2012  Rate: 64  Rhythm: normal sinus rhythm  QRS Axis: left  Intervals: normal  ST/T Wave abnormalities: nonspecific T wave changes  Conduction Disutrbances:nonspecific intraventricular conduction delay  Narrative Interpretation:   Old EKG Reviewed: changes noted Less artefact, and normalization of T wave changes from 1/14, abnormal though.  8:23 PM Family members present.  We discussed all results thus far.  With the persistent wheezing, mild hypoxia, patient received steroids, nebulizer treatment.  (The patient has not had any recent steroid therapy, nor any recent pulmonary evaluation. MDM  This elderly patient with dementia, COPD presents from her nursing facility with multiple concerns.  On exam the patient is afebrile, but hypoxic.  With her history of COPD she received steroid therapy.  The remainder the patient's evaluation is largely reassuring, but with her respiratory abnormalities, her decreased interactivity, or some suspicion for COPD exacerbation and treating to her mental status.   Patient was admitted for further evaluation and management.    Gerhard Munch, MD 08/25/12 2026

## 2012-08-25 NOTE — ED Notes (Signed)
Patient transported to X-ray 

## 2012-08-25 NOTE — ED Notes (Addendum)
Phlebotomy made aware that labs will need to be drawn for this patient. Unable to pull blood off of the IV with insertion.

## 2012-08-25 NOTE — ED Notes (Signed)
Pt given a sandwich and cup of water

## 2012-08-25 NOTE — ED Notes (Signed)
Bed: WA03 Expected date:  Expected time:  Means of arrival:  Comments: EMS 

## 2012-08-25 NOTE — Progress Notes (Signed)
CSW confirmed that pt is a resident at [facility] per chart.  No family was at pts bedside to consult.  Marva Panda, LCSWA  [phone]  .08/25/2012 6:20 pm

## 2012-08-25 NOTE — H&P (Signed)
Annette Cantrell is an 79 y.o. female.   Chief Complaint: Generalized weakness/failure to thrive/shortness of breath HPI: Patient is 79 year old female with past medical history significant for coronary artery disease history of silent inferior wall myocardial infarction, hypertension, history of CVA, dementia, hypercholesteremia, COPD, history of lung nodule, refused further workup and evaluation, Resident of assisted living came to the ER because of generalized weakness failure to thrive poor appetite and shortness of breath. In ER patient was noted to be hypoxic and had expiratory wheezing, received breathing treatment IV steroids and IV Zithromax with minimal improvement. Patient presently denies any fever or chills. Denies any cough. Denies any chest pain or shortness of breath. Very little history could be opted from patient due to dementia and no family member is present at this point. Past Medical History  Diagnosis Date  . Dementia   . Stroke   . High cholesterol   . Heart disease, unspecified   . LUNG NODULE 03/30/2008    Qualifier: Diagnosis of  By: [name] MD, [name]   . COPD 03/30/2008    Qualifier: Diagnosis of  By: [name] MD, [name]   . Infected sebaceous cyst 01/25/2012  . Herpes zoster 01/30/2012  . History of positive PPD, untreated     Past Surgical History  Procedure Laterality Date  . Hip arthroplasty  01/31/2012    Procedure: ARTHROPLASTY BIPOLAR HIP;  Surgeon: [name], MD;  Location: [facility] OR;  Service: Orthopedics;  Laterality: Right;    Family History  Problem Relation Age of Onset  . Stroke Mother    Social History:  reports that Annette Cantrell has been smoking Cigarettes.  Annette Cantrell has been smoking about 0.20 packs per day. Annette Cantrell does not have any smokeless tobacco history on file. Annette Cantrell reports that Annette Cantrell does not drink alcohol or use illicit drugs.  Allergies: No Known Allergies   (Not in a hospital admission)  Results for orders placed during the hospital encounter of  08/25/12 (from the past 48 hour(s))  URINALYSIS, ROUTINE W REFLEX MICROSCOPIC     Status: None   Collection Time    08/25/12  4:45 PM      Result Value Range   Color, Urine YELLOW  YELLOW   APPearance CLEAR  CLEAR   Specific Gravity, Urine 1.022  1.005 - 1.030   pH 6.5  5.0 - 8.0   Glucose, UA NEGATIVE  NEGATIVE mg/dL   Hgb urine dipstick NEGATIVE  NEGATIVE   Bilirubin Urine NEGATIVE  NEGATIVE   Ketones, ur NEGATIVE  NEGATIVE mg/dL   Protein, ur NEGATIVE  NEGATIVE mg/dL   Urobilinogen, UA 0.2  0.0 - 1.0 mg/dL   Nitrite NEGATIVE  NEGATIVE   Leukocytes, UA NEGATIVE  NEGATIVE   Comment: MICROSCOPIC NOT DONE ON URINES WITH NEGATIVE PROTEIN, BLOOD, LEUKOCYTES, NITRITE, OR GLUCOSE <1000 mg/dL.  CBC WITH DIFFERENTIAL     Status: Abnormal   Collection Time    08/25/12  5:36 PM      Result Value Range   WBC 4.6  4.0 - 10.5 K/uL   RBC 3.68 (*) 3.87 - 5.11 MIL/uL   Hemoglobin 12.2  12.0 - 15.0 g/dL   HCT 56.2  13.0 - 86.5 %   MCV 100.0  78.0 - 100.0 fL   MCH 33.2  26.0 - 34.0 pg   MCHC 33.2  30.0 - 36.0 g/dL   RDW 78.4  69.6 - 29.5 %   Platelets 156  150 - 400 K/uL   Neutrophils Relative %  38 (*) 43 - 77 %   Neutro Abs 1.7  1.7 - 7.7 K/uL   Lymphocytes Relative 42  12 - 46 %   Lymphs Abs 1.9  0.7 - 4.0 K/uL   Monocytes Relative 7  3 - 12 %   Monocytes Absolute 0.3  0.1 - 1.0 K/uL   Eosinophils Relative 13 (*) 0 - 5 %   Eosinophils Absolute 0.6  0.0 - 0.7 K/uL   Basophils Relative 0  0 - 1 %   Basophils Absolute 0.0  0.0 - 0.1 K/uL  COMPREHENSIVE METABOLIC PANEL     Status: Abnormal   Collection Time    08/25/12  5:36 PM      Result Value Range   Sodium 141  135 - 145 mEq/L   Potassium 4.0  3.5 - 5.1 mEq/L   Chloride 108  96 - 112 mEq/L   CO2 26  19 - 32 mEq/L   Glucose, Bld 77  70 - 99 mg/dL   BUN 25 (*) 6 - 23 mg/dL   Creatinine, Ser 0.86 (*) 0.50 - 1.10 mg/dL   Calcium 9.2  8.4 - 57.8 mg/dL   Total Protein 6.4  6.0 - 8.3 g/dL   Albumin 2.9 (*) 3.5 - 5.2 g/dL   AST 17  0  - 37 U/L   ALT 7  0 - 35 U/L   Alkaline Phosphatase 69  39 - 117 U/L   Total Bilirubin 0.2 (*) 0.3 - 1.2 mg/dL   GFR calc non Af Amer 41 (*) >90 mL/min   GFR calc Af Amer 47 (*) >90 mL/min   Comment: (NOTE)     The eGFR has been calculated using the CKD EPI equation.     This calculation has not been validated in all clinical situations.     eGFR's persistently <90 mL/min signify possible Chronic Kidney     Disease.  LIPASE, BLOOD     Status: None   Collection Time    08/25/12  5:36 PM      Result Value Range   Lipase 24  11 - 59 U/L  LACTIC ACID, PLASMA     Status: None   Collection Time    08/25/12  5:36 PM      Result Value Range   Lactic Acid, Venous 1.1  0.5 - 2.2 mmol/L  POCT I-STAT TROPONIN I     Status: None   Collection Time    08/25/12  5:43 PM      Result Value Range   Troponin i, poc 0.00  0.00 - 0.08 ng/mL   Comment 3            Comment: Due to the release kinetics of cTnI,     a negative result within the first hours     of the onset of symptoms does not rule out     myocardial infarction with certainty.     If myocardial infarction is still suspected,     repeat the test at appropriate intervals.   Dg Chest 2 View  08/25/2012   *RADIOLOGY REPORT*  Clinical Data: Chest discomfort and weakness  CHEST - 2 VIEW  Comparison: Portable chest x-ray of 01/31/2012  Findings: No active infiltrate or effusion is seen.  The previously noted right upper lobe scarring is most likely obscured by an electrode lead overlying that region.  The lungs are somewhat hyperaerated suggesting emphysema.  Mediastinal contours are stable and mild cardiomegaly is stable as is the  descending thoracic aorta which is somewhat ectatic.  The bones are osteopenic diffusely.  IMPRESSION: No active lung disease.  Stable cardiomegaly.   Original Report Authenticated By: Dwyane Dee, M.D.   Ct Head Wo Contrast  08/25/2012   *RADIOLOGY REPORT*  Clinical Data: Generalized weakness.  CT HEAD WITHOUT  CONTRAST  Technique:  Contiguous axial images were obtained from the base of the skull through the vertex without contrast.  Comparison: Head CT scan 01/30/2012.  Findings: The brain is atrophic with chronic microvascular ischemic change.  No evidence of acute abnormality including infarct, hemorrhage, mass lesion, mass effect, midline shift or abnormal extra-axial fluid collection.  No hydrocephalus or pneumocephalus. Calvarium intact. Imaged paranasal sinuses mastoid air cells are clear.  IMPRESSION: No acute finding.  Atrophy and chronic microvascular ischemic change.   Original Report Authenticated By: Holley Dexter, M.D.    Review of Systems  Constitutional: Negative for fever and chills.  Respiratory: Negative for cough, hemoptysis and sputum production.   Cardiovascular: Negative for chest pain.  Gastrointestinal: Negative for nausea, vomiting and abdominal pain.  Neurological: Negative for dizziness.    Blood pressure 148/76, pulse 56, temperature 98.4 F (36.9 C), temperature source Oral, resp. rate 19, SpO2 95.00%. Physical Exam  Eyes: Conjunctivae are normal. Left eye exhibits no discharge. No scleral icterus.  Neck: Normal range of motion. Neck supple. No JVD present. No tracheal deviation present. No thyromegaly present.  Cardiovascular: Normal rate and regular rhythm.   Murmur (Soft systolic murmur noted no S3 gallop) heard. Respiratory:  Bilateral expiratory wheezing noted  GI: Soft. Bowel sounds are normal.  Musculoskeletal: Annette Cantrell exhibits no edema and no tenderness.  Neurological: Annette Cantrell is alert.     Assessment/Plan Generalized weakness/failure to thrive Malnutrition Exacerbation of COPD Coronary artery disease history of silent intraoral MI Hypertension History of probable CA of lung Dementia History of CVA Plan As per orders  Jaylan Duggar N 08/25/2012, 9:10 PM

## 2012-08-25 NOTE — ED Notes (Signed)
Pt presents via EMS from [facility] assisted living facility. Per EMS, pt complaining of generalized weakness that has increased over the past 2 days. Staff also told EMS that she has had a cough and some decreased mobility issues. Pt reports that she normally is able to walk on her own. Per EMS, staff reported an odor to her urine. Pt also has a decrease in appetite.

## 2012-08-25 NOTE — ED Notes (Signed)
Pt given coffee; ok to have some per Dr. Jeraldine Loots. Family at bedside helping patient at this time.

## 2012-08-25 NOTE — ED Notes (Signed)
Resp paged for breathing tx

## 2012-08-26 LAB — BASIC METABOLIC PANEL
BUN: 23 mg/dL (ref 6–23)
Chloride: 103 mEq/L (ref 96–112)
GFR calc Af Amer: 45 mL/min — ABNORMAL LOW (ref >90)
Glucose, Bld: 212 mg/dL — ABNORMAL HIGH (ref 70–99)
Potassium: 3.5 mEq/L (ref 3.5–5.1)

## 2012-08-26 LAB — CBC
HCT: 36.4 % (ref 36.0–46.0)
Hemoglobin: 11.8 g/dL — ABNORMAL LOW (ref 12.0–15.0)
MCHC: 32.4 g/dL (ref 30.0–36.0)

## 2012-08-26 MED ORDER — ENSURE COMPLETE PO LIQD
237.0000 mL | Freq: Two times a day (BID) | ORAL | Status: DC
  Administered 2012-08-27 – 2012-08-30 (×7): 237 mL via ORAL

## 2012-08-26 MED ORDER — ADULT MULTIVITAMIN W/MINERALS CH
1.0000 | ORAL_TABLET | Freq: Every day | ORAL | Status: DC
  Administered 2012-08-27 – 2012-08-30 (×4): 1 via ORAL
  Filled 2012-08-26 (×5): qty 1

## 2012-08-26 MED ORDER — LEVALBUTEROL HCL 0.63 MG/3ML IN NEBU
0.6300 mg | INHALATION_SOLUTION | Freq: Four times a day (QID) | RESPIRATORY_TRACT | Status: DC | PRN
  Filled 2012-08-26: qty 3

## 2012-08-26 MED ORDER — LEVALBUTEROL HCL 0.63 MG/3ML IN NEBU
0.6300 mg | INHALATION_SOLUTION | Freq: Two times a day (BID) | RESPIRATORY_TRACT | Status: DC
  Administered 2012-08-29: 0.63 mg via RESPIRATORY_TRACT
  Filled 2012-08-26 (×11): qty 3

## 2012-08-26 MED ORDER — BOOST / RESOURCE BREEZE PO LIQD
1.0000 | ORAL | Status: DC
  Administered 2012-08-27 – 2012-08-30 (×4): 1 via ORAL

## 2012-08-26 MED ORDER — METHYLPREDNISOLONE SODIUM SUCC 125 MG IJ SOLR
60.0000 mg | Freq: Four times a day (QID) | INTRAMUSCULAR | Status: DC
  Administered 2012-08-26 – 2012-08-27 (×2): 60 mg via INTRAVENOUS
  Filled 2012-08-26 (×6): qty 60

## 2012-08-26 MED ORDER — AZITHROMYCIN 500 MG PO TABS
500.0000 mg | ORAL_TABLET | Freq: Every day | ORAL | Status: DC
  Administered 2012-08-27 – 2012-08-29 (×3): 500 mg via ORAL
  Filled 2012-08-26 (×5): qty 1

## 2012-08-26 MED ORDER — POTASSIUM CHLORIDE ER 10 MEQ PO TBCR
40.0000 meq | EXTENDED_RELEASE_TABLET | Freq: Once | ORAL | Status: AC
  Administered 2012-08-26: 40 meq via ORAL
  Filled 2012-08-26: qty 4

## 2012-08-26 MED ORDER — MEMANTINE HCL 5 MG PO TABS
5.0000 mg | ORAL_TABLET | Freq: Two times a day (BID) | ORAL | Status: DC
  Administered 2012-08-26 – 2012-08-30 (×7): 5 mg via ORAL
  Filled 2012-08-26 (×10): qty 1

## 2012-08-26 NOTE — Progress Notes (Signed)
Subjective:  Patient denies any chest pain states breathing is improved. Refused breathing treatment earlier today  Objective:  Vital Signs in the last 24 hours: Temp:  [97.5 F (36.4 C)-98.4 F (36.9 C)] 97.5 F (36.4 C) (08/22 0713) Pulse Rate:  [56-76] 71 (08/22 0713) Resp:  [14-19] 18 (08/22 0713) BP: (130-174)/(63-95) 168/95 mmHg (08/22 0713) SpO2:  [92 %-99 %] 99 % (08/22 0713) Weight:  [40.5 kg (89 lb 4.6 oz)] 40.5 kg (89 lb 4.6 oz) (08/22 0900)  Intake/Output from previous day: 08/21 0701 - 08/22 0700 In: 450 [I.V.:400; IV Piggyback:50] Out: -  Intake/Output from this shift:    Physical Exam: Neck: no adenopathy, no carotid bruit, no JVD and supple, symmetrical, trachea midline Lungs: Faint expiratory wheezing with occasional rhonchi noted Heart: regular rate and rhythm, S1, S2 normal and Soft systolic murmur noted Abdomen: soft, non-tender; bowel sounds normal; no masses,  no organomegaly Extremities: extremities normal, atraumatic, no cyanosis or edema  Lab Results:  Recent Labs  08/25/12 1736 08/26/12 0415  WBC 4.6 3.1*  HGB 12.2 11.8*  PLT 156 156    Recent Labs  08/25/12 1736 08/26/12 0415  NA 141 141  K 4.0 3.5  CL 108 103  CO2 26 24  GLUCOSE 77 212*  BUN 25* 23  CREATININE 1.17* 1.21*   No results found for this basename: TROPONINI, CK, MB,  in the last 72 hours Hepatic Function Panel  Recent Labs  08/25/12 1736  PROT 6.4  ALBUMIN 2.9*  AST 17  ALT 7  ALKPHOS 69  BILITOT 0.2*   No results found for this basename: CHOL,  in the last 72 hours No results found for this basename: PROTIME,  in the last 72 hours  Imaging: Imaging results have been reviewed and Dg Chest 2 View  08/25/2012   *RADIOLOGY REPORT*  Clinical Data: Chest discomfort and weakness  CHEST - 2 VIEW  Comparison: Portable chest x-ray of 01/31/2012  Findings: No active infiltrate or effusion is seen.  The previously noted right upper lobe scarring is most likely obscured  by an electrode lead overlying that region.  The lungs are somewhat hyperaerated suggesting emphysema.  Mediastinal contours are stable and mild cardiomegaly is stable as is the descending thoracic aorta which is somewhat ectatic.  The bones are osteopenic diffusely.  IMPRESSION: No active lung disease.  Stable cardiomegaly.   Original Report Authenticated By: Dwyane Dee, M.D.   Ct Head Wo Contrast  08/25/2012   *RADIOLOGY REPORT*  Clinical Data: Generalized weakness.  CT HEAD WITHOUT CONTRAST  Technique:  Contiguous axial images were obtained from the base of the skull through the vertex without contrast.  Comparison: Head CT scan 01/30/2012.  Findings: The brain is atrophic with chronic microvascular ischemic change.  No evidence of acute abnormality including infarct, hemorrhage, mass lesion, mass effect, midline shift or abnormal extra-axial fluid collection.  No hydrocephalus or pneumocephalus. Calvarium intact. Imaged paranasal sinuses mastoid air cells are clear.  IMPRESSION: No acute finding.  Atrophy and chronic microvascular ischemic change.   Original Report Authenticated By: Holley Dexter, M.D.    Cardiac Studies:  Assessment/Plan:  Generalized weakness/failure to thrive  Malnutrition  Exacerbation of COPD  Coronary artery disease history of silent intraoral MI  Hypertension  History of probable CA of lung  Dementia  History of CVA Hypokalemia Plan As per orders  LOS: 1 day    Shakiyah Cirilo N 08/26/2012, 12:01 PM

## 2012-08-26 NOTE — Progress Notes (Signed)
PHARMACIST - PHYSICIAN COMMUNICATION CONCERNING: Antibiotic IV to Oral Route Change Policy  RECOMMENDATION: This patient is receiving Azithromycin by the intravenous route.  Based on criteria approved by the Pharmacy and Therapeutics Committee, the antibiotic(s) is/are being converted to the equivalent oral dose form(s).   DESCRIPTION: These criteria include:  Patient being treated for a respiratory tract infection, urinary tract infection, or cellulitis  The patient is not neutropenic and does not exhibit a GI malabsorption state  The patient is eating (either orally or via tube) and/or has been taking other orally administered medications for a least 24 hours  The patient is improving clinically and has a Tmax < 100.5  If you have questions about this conversion, please contact the Pharmacy Department  []   [phone] )  [facility] []   [phone] )  [facility]  []   [phone] )  [facility] [x]   [phone] )  [facility]    Geoffry Paradise, PharmD, [place] Pager: [phone] 11:40 AM Pharmacy #: [id number]

## 2012-08-26 NOTE — Progress Notes (Signed)
INITIAL NUTRITION ASSESSMENT  DOCUMENTATION CODES Per approved criteria  -Underweight   INTERVENTION: Continue Ensure Complete BID Provide Information systems manager Cup once daily Provide Multivitamin with minerals daily  NUTRITION DIAGNOSIS: Inadequate oral intake related to poor appetite as evidenced by pt not eating and 20% wt loss in less than 7 months.   Goal: Pt to meet >/= 90% of their estimated nutrition needs   Monitor:  PO intake Weight Labs  Reason for Assessment: Malnutrition Screening Tool (MST) score of 3  79 y.o. female  Admitting Dx: COPD Exacerbation  ASSESSMENT: 79 year old female with past medical history significant for coronary artery disease history of silent inferior wall myocardial infarction, hypertension, history of CVA, dementia, hypercholesteremia, COPD, history of lung nodule, refused further workup and evaluation,  Resident of assisted living came to the ER because of generalized weakness failure to thrive poor appetite and shortness of breath.  Pt seemingly confused at time of visit and unable to provide history. When RD asked pt about food intake she simply stated "I'm not interested". RN reports that pt refused breakfast and Ensure supplement. Pt with failure to thrive and malnutrition per MD note.  Height: Ht Readings from Last 1 Encounters:  01/30/12 5\' 1"  (1.549 m)    Weight: Wt Readings from Last 1 Encounters:  08/26/12 89 lb 4.6 oz (40.5 kg)    Ideal Body Weight: 105 lbs  % Ideal Body Weight: 85%  Wt Readings from Last 10 Encounters:  08/26/12 89 lb 4.6 oz (40.5 kg)  02/04/12 112 lb (50.803 kg)  02/04/12 112 lb (50.803 kg)  12/23/09 100 lb 4 oz (45.473 kg)  07/16/09 103 lb (46.72 kg)  06/27/09 105 lb (47.628 kg)  03/14/09 111 lb 4 oz (50.463 kg)  03/30/08 95 lb 2.1 oz (43.151 kg)    Usual Body Weight: 112 lbs (January 2014)  % Usual Body Weight: 80%  BMI:  Body mass index is 16.88 kg/(m^2).  Estimated Nutritional  Needs: Kcal: 1300-1460 Protein: 60-70 grams Fluid: >/= 1.2 L  Skin: WDL  Diet Order: Cardiac  EDUCATION NEEDS: -No education needs identified at this time   Intake/Output Summary (Last 24 hours) at 08/26/12 1155 Last data filed at 08/26/12 0900  Gross per 24 hour  Intake    450 ml  Output      0 ml  Net    450 ml    Last BM: PTA   Labs:   Recent Labs Lab 08/25/12 1736 08/26/12 0415  NA 141 141  K 4.0 3.5  CL 108 103  CO2 26 24  BUN 25* 23  CREATININE 1.17* 1.21*  CALCIUM 9.2 9.5  GLUCOSE 77 212*    CBG (last 3)  No results found for this basename: GLUCAP,  in the last 72 hours  Scheduled Meds: . amLODipine  5 mg Oral Daily  . aspirin EC  81 mg Oral Daily  . atorvastatin  10 mg Oral Daily  . azithromycin  500 mg Oral QHS  . cefTRIAXone (ROCEPHIN)  IV  1 g Intravenous QHS  . divalproex  250 mg Oral TID  . feeding supplement  237 mL Oral TID WC  . ferrous sulfate  325 mg Oral Q breakfast  . heparin  5,000 Units Subcutaneous Q8H  . levalbuterol  0.63 mg Nebulization Q6H  . LORazepam  0.5 mg Oral TID  . Memantine HCl ER  14 mg Oral Daily  . methylPREDNISolone sodium succinate  60 mg Intravenous Q6H  . metoprolol  succinate  25 mg Oral Daily  . pantoprazole  40 mg Oral Q0600  . sertraline  25 mg Oral Daily  . tiotropium  18 mcg Inhalation Daily  . [START ON 08/29/2012] Vitamin D (Ergocalciferol)  50,000 Units Oral Q7 days    Continuous Infusions: . sodium chloride 50 mL/hr at 08/25/12 2223    Past Medical History  Diagnosis Date  . Dementia   . Stroke   . High cholesterol   . Heart disease, unspecified   . LUNG NODULE 03/30/2008    Qualifier: Diagnosis of  By: [name] MD, [name]   . COPD 03/30/2008    Qualifier: Diagnosis of  By: [name] MD, [name]   . Infected sebaceous cyst 01/25/2012  . Herpes zoster 01/30/2012  . History of positive PPD, untreated     Past Surgical History  Procedure Laterality Date  . Hip arthroplasty  01/31/2012     Procedure: ARTHROPLASTY BIPOLAR HIP;  Surgeon: [name], MD;  Location: [facility] OR;  Service: Orthopedics;  Laterality: Right;    Ian Malkin RD, LDN Inpatient Clinical Dietitian Pager: [phone] After Hours Pager: [phone]

## 2012-08-26 NOTE — Progress Notes (Signed)
Clinical Social Work Department BRIEF PSYCHOSOCIAL ASSESSMENT 08/26/2012  Patient:  Annette Cantrell, Annette Cantrell     Account Number:  [id number]     Admit date:  08/25/2012  Clinical Social Worker:  Dennison Bulla  Date/Time:  08/26/2012 03:00 PM  Referred by:  Physician  Date Referred:  08/26/2012 Referred for  ALF Placement   Other Referral:   Interview type:  Family Other interview type:    PSYCHOSOCIAL DATA Living Status:  FACILITY Admitted from facility:  [facility] Level of care:  Assisted Living Primary support name:  Britta Mccreedy Primary support relationship to patient:  CHILD, ADULT Degree of support available:   Strong    CURRENT CONCERNS Current Concerns  Post-Acute Placement   Other Concerns:    SOCIAL WORK ASSESSMENT / PLAN CSW received referral due to patient being admitted from a facility. CSW reviewed chart and attempted to meet with patient but patient is unable to participate in assessment. CSW called and spoke with dtr Britta Mccreedy) who report she is patient's POA but no paperwork listed in chart.    CSW introduced myself and explained role. Patient has been at [facility] since March. Dtr prefers patient to return to facility if possible. CSW explained that MD would make recommendations to determine if a higher level of care is needed. Dtr reports she would like to be kept updated on plans but is agreeable to SNF if needed. CSW spoke with RN regarding getting PT/OT ordered.    CSW called ALF who reports that patient was able to use walker and shuffle around but has not been out of bed for the past two days. ALF reports that patient has refused to eat or drink the last two days and has become too weak to be able to ambulate. ALF reports that RN would have to review but that patient would need to be back to baseline to return.    CSW will continue to follow and will assist with DC planning.   Assessment/plan status:  Psychosocial Support/Ongoing Assessment of  Needs Other assessment/ plan:   Information/referral to community resources:   Return to ALF vs SNF placement    PATIENT'S/FAMILY'S RESPONSE TO PLAN OF CARE: Patient unable to participate in assessment. Patient's dtr engaged in assessment and agreeable to what MD recommends. Dtr agreeable for CSW to follow up throughout hospital stay.       (Coverage for Unice Bailey)

## 2012-08-27 ENCOUNTER — Inpatient Hospital Stay (HOSPITAL_COMMUNITY): Payer: Medicare Other

## 2012-08-27 LAB — CBC
HCT: 34.7 % — ABNORMAL LOW (ref 36.0–46.0)
MCV: 96.7 fL (ref 78.0–100.0)
RBC: 3.59 MIL/uL — ABNORMAL LOW (ref 3.87–5.11)
WBC: 8.5 10*3/uL (ref 4.0–10.5)

## 2012-08-27 LAB — BASIC METABOLIC PANEL
BUN: 21 mg/dL (ref 6–23)
CO2: 19 mEq/L (ref 19–32)
Chloride: 107 mEq/L (ref 96–112)
Creatinine, Ser: 1.03 mg/dL (ref 0.50–1.10)
Potassium: 4.8 mEq/L (ref 3.5–5.1)

## 2012-08-27 MED ORDER — METHYLPREDNISOLONE SODIUM SUCC 125 MG IJ SOLR
60.0000 mg | Freq: Three times a day (TID) | INTRAMUSCULAR | Status: DC
  Administered 2012-08-27 – 2012-08-28 (×3): 60 mg via INTRAVENOUS
  Filled 2012-08-27 (×5): qty 0.96

## 2012-08-27 NOTE — Progress Notes (Signed)
Patient refused treatments. I explained to patient that she was wheezing and that she needed the treatment. She refused treatment twice. RN aware. RN said she would let me know if patient decided she wanted the treatment.

## 2012-08-27 NOTE — Progress Notes (Signed)
Subjective:  Patient complains of cough refusing to take by mouth meds. Refused yesterday breathing treatment.  Objective:  Vital Signs in the last 24 hours: Temp:  [97.3 F (36.3 C)-98.7 F (37.1 C)] 97.6 F (36.4 C) (08/23 4098) Pulse Rate:  [63-73] 63 (08/23 0633) Resp:  [16] 16 (08/23 0633) BP: (135-185)/(58-68) 185/68 mmHg (08/23 0633) SpO2:  [97 %-98 %] 98 % (08/23 1191)  Intake/Output from previous day: 08/22 0701 - 08/23 0700 In: 1490 [P.O.:240; I.V.:1200; IV Piggyback:50] Out: -  Intake/Output from this shift:    Physical Exam: Neck: no adenopathy, no carotid bruit, no JVD and supple, symmetrical, trachea midline Lungs: Decreased breath sound at bases occasional rhonchi and expiratory wheezing Heart: regular rate and rhythm, S1, S2 normal and Soft systolic murmur noted Abdomen: soft, non-tender; bowel sounds normal; no masses,  no organomegaly Extremities: extremities normal, atraumatic, no cyanosis or edema  Lab Results:  Recent Labs  08/26/12 0415 08/27/12 0525  WBC 3.1* 8.5  HGB 11.8* 11.9*  PLT 156 136*    Recent Labs  08/26/12 0415 08/27/12 0525  NA 141 140  K 3.5 4.8  CL 103 107  CO2 24 19  GLUCOSE 212* 147*  BUN 23 21  CREATININE 1.21* 1.03   No results found for this basename: TROPONINI, CK, MB,  in the last 72 hours Hepatic Function Panel  Recent Labs  08/25/12 1736  PROT 6.4  ALBUMIN 2.9*  AST 17  ALT 7  ALKPHOS 69  BILITOT 0.2*   No results found for this basename: CHOL,  in the last 72 hours No results found for this basename: PROTIME,  in the last 72 hours  Imaging: Imaging results have been reviewed and Dg Chest 2 View  08/25/2012   *RADIOLOGY REPORT*  Clinical Data: Chest discomfort and weakness  CHEST - 2 VIEW  Comparison: Portable chest x-ray of 01/31/2012  Findings: No active infiltrate or effusion is seen.  The previously noted right upper lobe scarring is most likely obscured by an electrode lead overlying that region.   The lungs are somewhat hyperaerated suggesting emphysema.  Mediastinal contours are stable and mild cardiomegaly is stable as is the descending thoracic aorta which is somewhat ectatic.  The bones are osteopenic diffusely.  IMPRESSION: No active lung disease.  Stable cardiomegaly.   Original Report Authenticated By: Dwyane Dee, M.D.   Ct Head Wo Contrast  08/25/2012   *RADIOLOGY REPORT*  Clinical Data: Generalized weakness.  CT HEAD WITHOUT CONTRAST  Technique:  Contiguous axial images were obtained from the base of the skull through the vertex without contrast.  Comparison: Head CT scan 01/30/2012.  Findings: The brain is atrophic with chronic microvascular ischemic change.  No evidence of acute abnormality including infarct, hemorrhage, mass lesion, mass effect, midline shift or abnormal extra-axial fluid collection.  No hydrocephalus or pneumocephalus. Calvarium intact. Imaged paranasal sinuses mastoid air cells are clear.  IMPRESSION: No acute finding.  Atrophy and chronic microvascular ischemic change.   Original Report Authenticated By: Holley Dexter, M.D.    Cardiac Studies:  Assessment/Plan:  Generalized weakness/failure to thrive  Malnutrition  Exacerbation of COPD  Coronary artery disease history of silent intraoral MI  Hypertension  History of probable CA of lung  Dementia  History of CVA Plan Check chest x-ray OT PT consult Wean off steroids  LOS: 2 days    Severina Sykora N 08/27/2012, 10:13 AM

## 2012-08-27 NOTE — Progress Notes (Signed)
OT Cancellation Note  Patient Details Name: Annette Cantrell MRN: [id number] DOB: [birth year]   Cancelled Treatment:    Reason Eval/Treat Not Completed: Fatigue/lethargy limiting ability to participate (patient declined).  Will continue to follow.  Evern Bio 08/27/2012, 4:02 PM

## 2012-08-27 NOTE — Progress Notes (Signed)
PT Cancellation Note  Patient Details Name: Annette Cantrell MRN: [id number] DOB: 03/24/1922   Cancelled Treatment:    Reason Eval/Treat Not Completed: Fatigue/lethargy limiting ability to participate Pt adamantly refused despite multiple attempts.  "please leave me alone" Will attempt again on Monday   [name] 08/27/2012, 1:28 PM

## 2012-08-27 NOTE — Progress Notes (Signed)
Patient refusing all PO meds as well as SQ heparin. Gets somewhat agitated when RN tries to encourage her to take her meds. Will continue to monitor. Ginny Forth

## 2012-08-28 MED ORDER — METHYLPREDNISOLONE SODIUM SUCC 40 MG IJ SOLR
40.0000 mg | Freq: Three times a day (TID) | INTRAMUSCULAR | Status: DC
  Administered 2012-08-28 – 2012-08-29 (×3): 40 mg via INTRAVENOUS
  Filled 2012-08-28 (×5): qty 1

## 2012-08-28 NOTE — Progress Notes (Signed)
Subjective:  Patient denies any chest pain states breathing is improved  Objective:  Vital Signs in the last 24 hours: Temp:  [97.5 F (36.4 C)-98.3 F (36.8 C)] 98.3 F (36.8 C) (08/24 1427) Pulse Rate:  [61-66] 66 (08/24 1427) Resp:  [18-20] 18 (08/24 1427) BP: (129-150)/(61-96) 144/61 mmHg (08/24 1427) SpO2:  [95 %-96 %] 95 % (08/24 1427)  Intake/Output from previous day: 08/23 0701 - 08/24 0700 In: 1120 [P.O.:830; I.V.:240; IV Piggyback:50] Out: -  Intake/Output from this shift: Total I/O In: 800 [P.O.:360; Other:440] Out: -   Physical Exam: Neck: no adenopathy, no carotid bruit, no JVD and supple, symmetrical, trachea midline Lungs: Decrease breath sounds at bases with occasional rhonchi right lung Heart: regular rate and rhythm, S1, S2 normal, no murmur, click, rub or gallop Abdomen: soft, non-tender; bowel sounds normal; no masses,  no organomegaly Extremities: extremities normal, atraumatic, no cyanosis or edema  Lab Results:  Recent Labs  08/26/12 0415 08/27/12 0525  WBC 3.1* 8.5  HGB 11.8* 11.9*  PLT 156 136*    Recent Labs  08/26/12 0415 08/27/12 0525  NA 141 140  K 3.5 4.8  CL 103 107  CO2 24 19  GLUCOSE 212* 147*  BUN 23 21  CREATININE 1.21* 1.03   No results found for this basename: TROPONINI, CK, MB,  in the last 72 hours Hepatic Function Panel  Recent Labs  08/25/12 1736  PROT 6.4  ALBUMIN 2.9*  AST 17  ALT 7  ALKPHOS 69  BILITOT 0.2*   No results found for this basename: CHOL,  in the last 72 hours No results found for this basename: PROTIME,  in the last 72 hours  Imaging: Imaging results have been reviewed and Dg Chest 2 View  08/27/2012   *RADIOLOGY REPORT*  Clinical Data: Cough.  CHEST - 2 VIEW  Comparison: 08/25/2012  Findings: Patchy nodular airspace infiltrate in the lateral right upper lung has progressed since previous study.  Left lung remains clear.  Heart size upper limits normal.  Tortuous atheromatous ectatic  thoracic aorta.  No effusion.  Regional bones unremarkable.  IMPRESSION:  Progressive patchy right upper lobe airspace disease suggesting pneumonia.   Original Report Authenticated By: D. Andria Rhein, MD    Cardiac Studies:  Assessment/Plan:  Resolving right pneumonia Generalized weakness/failure to thrive  Malnutrition  Resolving Exacerbation of COPD  Coronary artery disease history of silent intraoral MI  Hypertension  History of probable CA of lung  Dementia  History of CVA  Plan Continue present management as per orders  LOS: 3 days    Genee Rann N 08/28/2012, 2:50 PM

## 2012-08-28 NOTE — Progress Notes (Signed)
PT Cancellation Note  Patient Details Name: Annette Cantrell MRN: [id number] DOB: Oct 18, 1922   Cancelled Treatment:    Reason Eval/Treat Not Completed: pt declined to participate. will check back another day. Thanks.   Rebeca Alert, MPT Pager: [phone]

## 2012-08-28 NOTE — Progress Notes (Signed)
OT Cancellation Note  Patient Details Name: Annette Cantrell MRN: [id number] DOB: 1922/03/23   Cancelled Treatment:    Reason Eval/Treat Not Completed: Patient declined, no reason specified  Evern Bio 08/28/2012, 10:05 AM

## 2012-08-29 MED ORDER — PREDNISONE 20 MG PO TABS
20.0000 mg | ORAL_TABLET | Freq: Every day | ORAL | Status: DC
  Administered 2012-08-29: 20 mg via ORAL
  Filled 2012-08-29 (×2): qty 1

## 2012-08-29 NOTE — Progress Notes (Signed)
CSW reviewed PT evaluation recommending SNF at discharge, CSW spoke with patient's daughter, Britta Mccreedy (ph#: [phone]) who is agreeable with plan & SNF search. CSW provided bed offers to daughter who has accepted bed offer @ [facility]. Per daughter, patient has been there in the past. CSW confirmed with [facility] @ SNF that they would be able to take patient when ready. Anticipating discharge tomorrow [place] Mote, 8/26).   Dr. Sharyn Lull - please sign FL2 & DNR on shadow chart in [facility].   Clinical Social Work Department CLINICAL SOCIAL WORK PLACEMENT NOTE 08/29/2012  Patient:  Annette Cantrell, Annette Cantrell  Account Number:  [id number] Admit date:  08/25/2012  Clinical Social Worker:  Orpah Greek  Date/time:  08/29/2012 02:33 PM  Clinical Social Work is seeking post-discharge placement for this patient at the following level of care:   SKILLED NURSING   (*CSW will update this form in Epic as items are completed)   08/29/2012  Patient/family provided with [facility] Department of Clinical Social Work's list of facilities offering this level of care within the geographic area requested by the patient (or if unable, by the patient's family).  08/29/2012  Patient/family informed of their freedom to choose among providers that offer the needed level of care, that participate in Medicare, Medicaid or managed care program needed by the patient, have an available bed and are willing to accept the patient.  08/29/2012  Patient/family informed of [facility]' ownership interest in [facility], as well as of the fact that they are under no obligation to receive care at this facility.  PASARR submitted to EDS on 08/29/2012 PASARR number received from EDS on 08/29/2012  FL2 transmitted to all facilities in geographic area requested by pt/family on  08/29/2012 FL2 transmitted to all facilities within larger geographic area on   Patient informed that his/her managed care company has contracts with  or will negotiate with  certain facilities, including the following:     Patient/family informed of bed offers received:  08/29/2012 Patient chooses bed at [facility] Physician recommends and patient chooses bed at    Patient to be transferred to [facility] on   Patient to be transferred to facility by   The following physician request were entered in Epic:   Additional Comments:  Unice Bailey, LCSW [facility] Clinical Social Worker cell #: [phone]

## 2012-08-29 NOTE — Progress Notes (Signed)
Subjective:  Patient denies any chest pain shortness of breath. Had periods of confusion. Objective:  Vital Signs in the last 24 hours: Temp:  [97.3 F (36.3 C)-98.1 F (36.7 C)] 97.5 F (36.4 C) (08/25 1423) Pulse Rate:  [60-65] 60 (08/25 1423) Resp:  [18] 18 (08/25 1423) BP: (124-156)/(72-89) 151/86 mmHg (08/25 1423) SpO2:  [94 %-100 %] 96 % (08/25 1423)  Intake/Output from previous day: 08/24 0701 - 08/25 0700 In: 1808.5 [P.O.:960; I.V.:358.5; IV Piggyback:50] Out: -  Intake/Output from this shift: Total I/O In: 240 [P.O.:240] Out: -   Physical Exam: Neck: no adenopathy, no carotid bruit, no JVD and supple, symmetrical, trachea midline Lungs: Decreased breath sound at bases with occasional right lung rhonchi Heart: regular rate and rhythm, S1, S2 normal, no murmur, click, rub or gallop Abdomen: soft, non-tender; bowel sounds normal; no masses,  no organomegaly Extremities: extremities normal, atraumatic, no cyanosis or edema  Lab Results:  Recent Labs  08/27/12 0525  WBC 8.5  HGB 11.9*  PLT 136*    Recent Labs  08/27/12 0525  NA 140  K 4.8  CL 107  CO2 19  GLUCOSE 147*  BUN 21  CREATININE 1.03   No results found for this basename: TROPONINI, CK, MB,  in the last 72 hours Hepatic Function Panel No results found for this basename: PROT, ALBUMIN, AST, ALT, ALKPHOS, BILITOT, BILIDIR, IBILI,  in the last 72 hours No results found for this basename: CHOL,  in the last 72 hours No results found for this basename: PROTIME,  in the last 72 hours  Imaging: Imaging results have been reviewed and No results found.  Cardiac Studies:  Assessment/Plan:  Resolving right pneumonia  Generalized weakness/failure to thrive  Malnutrition  Resolving Exacerbation of COPD  Coronary artery disease history of silent intraoral MI  Hypertension  History of probable CA of lung  Dementia  History of CVA  Plan Change IV steroids to by mouth prednisone Check chest x-ray in  a.m.  LOS: 4 days    Annette Cantrell 08/29/2012, 6:36 PM

## 2012-08-29 NOTE — Evaluation (Signed)
Physical Therapy Evaluation Patient Details Name: Annette Cantrell MRN: [id number] DOB: 18-Mar-1922 Today's Date: 08/29/2012 Time: 0865-7846 PT Time Calculation (min): 8 min  PT Assessment / Plan / Recommendation History of Present Illness  79 y.o. female with h/o dementia, lung CA, CVA, hip fx admitted from ALF with PNA, weakness, FTT, COPD exacerbation.   Clinical Impression  *Pt reported pain with movement of R ankle. RN notified.  Pt initially agreed to get OOB to recliner. She independently performed supine to sit, and sit to stand, then adamantly refused to transfer to recliner. Pt  requested PT/OT leave her alone despite encouragement to get OOB and reminders of benefits of mobility. Multiple attempts to motivate pt to participate were unsuccessful. Will re-attempt. **    PT Assessment  Patient needs continued PT services    Follow Up Recommendations  SNF    Does the patient have the potential to tolerate intense rehabilitation      Barriers to Discharge        Equipment Recommendations  None recommended by PT    Recommendations for Other Services OT consult   Frequency Min 3X/week    Precautions / Restrictions Precautions Precautions: None Restrictions Weight Bearing Restrictions: No   Pertinent Vitals/Pain **R ankle/foot pain with movement, RN notified*      Mobility  Bed Mobility Bed Mobility: Supine to Sit Supine to Sit: 6: Modified independent (Device/Increase time);HOB elevated;With rails Transfers Transfers: Sit to Stand;Stand to Sit Sit to Stand: 5: Supervision;From bed Stand to Sit: 5: Supervision;To bed Details for Transfer Assistance: pt refused adamantly to transfer to recliner, stated she wanted to be left alone despite multiple attempts to encourage pt to get OOB Ambulation/Gait Ambulation/Gait Assistance: Not tested (comment)    Exercises     PT Diagnosis: Generalized weakness  PT Problem List: Decreased activity tolerance;Decreased  mobility;Decreased cognition PT Treatment Interventions: Gait training;Therapeutic exercise;Functional mobility training;Therapeutic activities     PT Goals(Current goals can be found in the care plan section) Acute Rehab PT Goals Patient Stated Goal: none stated PT Goal Formulation: Patient unable to participate in goal setting Time For Goal Achievement: 09/12/12 Potential to Achieve Goals: Fair  Visit Information  Last PT Received On: 08/29/12 History of Present Illness: 79 y.o. female with h/o dementia, lung CA, CVA, hip fx admitted from ALF with PNA, weakness, FTT, COPD exacerbation.        Prior Functioning  Home Living Family/patient expects to be discharged to:: Assisted living Home Equipment: Walker - 2 wheels Prior Function Comments: pt unable to provide this information 2* dementia, no family present Communication Communication: No difficulties    Cognition  Cognition Arousal/Alertness: Awake/alert Behavior During Therapy: Agitated Overall Cognitive Status: History of cognitive impairments - at baseline    Extremity/Trunk Assessment Upper Extremity Assessment Upper Extremity Assessment: Overall WFL for tasks assessed Lower Extremity Assessment Lower Extremity Assessment: Overall WFL for tasks assessed Cervical / Trunk Assessment Cervical / Trunk Assessment: Normal   Balance Balance Balance Assessed: Yes Static Sitting Balance Static Sitting - Balance Support: No upper extremity supported;Feet supported Static Sitting - Comment/# of Minutes: 3  End of Session PT - End of Session Activity Tolerance: Treatment limited secondary to agitation Patient left: in bed;with call bell/phone within reach;with bed alarm set Nurse Communication: Mobility status  GP     Annette Cantrell 08/29/2012, 1:10 PM [phone]

## 2012-08-29 NOTE — Evaluation (Signed)
Occupational Therapy Evaluation Patient Details Name: Annette Cantrell MRN: [id number] DOB: 01-24-1922 Today's Date: 08/29/2012 Time: 1245-1300 OT Time Calculation (min): 15 min  OT Assessment / Plan / Recommendation History of present illness 79 y.o. female with h/o dementia, lung CA, CVA, hip fx admitted from ALF with PNA, weakness, FTT, COPD exacerbation.    Clinical Impression   Pt self limited activity. She transferred to EOB and stood with max encouragement but then refused to mobilize any further. She adamantly requested return to bed. She declined her meal tray and was adamant about it being removed from her tray table. Nursing made aware of all.     OT Assessment  Patient needs continued OT Services    Follow Up Recommendations  Supervision/Assistance - 24 hour;SNF    Barriers to Discharge      Equipment Recommendations  None recommended by OT    Recommendations for Other Services    Frequency  Min 2X/week    Precautions / Restrictions Precautions Precautions: None Precaution Comments: pt stood at EOB only then wanted back in bed for eval Restrictions Weight Bearing Restrictions: No   Pertinent Vitals/Pain No complaint of    ADL  Eating/Feeding: Set up;Simulated Grooming: Simulated;Wash/dry hands;Set up Where Assessed - Grooming: Supine, head of bed up Toilet Transfer: Simulated;Supervision/safety Toilet Transfer Method: Sit to stand ADL Comments: Pt with max encouragement to get up to EOB and did stand briefly but then wanted to return to bed. She repeatedly said "leave me alone. I dont know you." Attempted to explain role but pt becoming more agitated and stating adamantly that she wanted to return to bed. Pt refusing to eat lunch tray and requesting repeatedly to have lunch tray removed from table. Put tray on sink and informed nursing of all. Pt also stating "I am strong" when OT and  PT explained reason for comign to see her and requesting OOB activity.  Very  limited eval.    OT Diagnosis: Generalized weakness  OT Problem List: Decreased strength;Decreased cognition;Decreased knowledge of use of DME or AE OT Treatment Interventions: Self-care/ADL training;DME and/or AE instruction;Therapeutic activities;Patient/family education   OT Goals(Current goals can be found in the care plan section) Acute Rehab OT Goals Patient Stated Goal: none stated  Visit Information  Last OT Received On: 08/29/12 History of Present Illness: 79 y.o. female with h/o dementia, lung CA, CVA, hip fx admitted from ALF with PNA, weakness, FTT, COPD exacerbation.        Prior Functioning     Home Living Family/patient expects to be discharged to:: Assisted living Home Equipment: Walker - 2 wheels Prior Function Comments: pt unable to provide this information 2* dementia, no family present Communication Communication: No difficulties         Vision/Perception     Cognition  Cognition Arousal/Alertness: Awake/alert Behavior During Therapy: Agitated Overall Cognitive Status: History of cognitive impairments - at baseline    Extremity/Trunk Assessment Upper Extremity Assessment Upper Extremity Assessment: Overall WFL for tasks assessed Lower Extremity Assessment  Cervical / Trunk Assessment Cervical / Trunk Assessment: Normal     Mobility Bed Mobility Bed Mobility: Supine to Sit;Sit to Supine Supine to Sit: 6: Modified independent (Device/Increase time);HOB elevated;With rails Sit to Supine: 6: Modified independent (Device/Increase time);With rail;HOB elevated Transfers Transfers: Sit to Stand;Stand to Sit Sit to Stand: 5: Supervision;From bed Stand to Sit: 5: Supervision;To bed Details for Transfer Assistance: pt refused adamantly to transfer to recliner, stated she wanted to be left alone despite multiple attempts to  encourage pt to get OOB     Exercise     Balance Balance Balance Assessed: Yes Static Sitting Balance Static Sitting -  Balance Support: No upper extremity supported;Feet supported Static Sitting - Comment/# of Minutes: 3   End of Session OT - End of Session Activity Tolerance: Other (comment) (pt self limited activity) Patient left: in bed;with call bell/phone within reach;with bed alarm set  GO     Lennox Laity [phone] 08/29/2012, 1:25 PM

## 2012-08-30 ENCOUNTER — Inpatient Hospital Stay (HOSPITAL_COMMUNITY): Payer: Medicare Other

## 2012-08-30 MED ORDER — LEVOFLOXACIN 250 MG PO TABS: 250.0000 mg | ORAL_TABLET | Freq: Every day | ORAL | Status: DC

## 2012-08-30 MED ORDER — PREDNISONE 20 MG PO TABS: 10.0000 mg | ORAL_TABLET | Freq: Every day | ORAL | Status: DC

## 2012-08-30 NOTE — Discharge Summary (Signed)
  Priority discharge summary dictated on 08/30/2012 dictation [id number]

## 2012-08-30 NOTE — Progress Notes (Signed)
Attempted to call report to nursing facility. No one able to take report at this time. Setzer, Don Broach

## 2012-08-30 NOTE — Discharge Summary (Signed)
[name], Cantrell            ACCOUNT NO.:  [id number]  MEDICAL RECORD NO.:  [id number]  LOCATION:  1432                         FACILITY:  [facility]  PHYSICIAN:  Eduardo Osier. Sharyn Lull, M.D. DATE OF BIRTH:  October 26, 1922  DATE OF ADMISSION:  08/25/2012 DATE OF DISCHARGE:  08/30/2012                              DISCHARGE SUMMARY   Discharged to [facility] skilled nursing facility.  ADMITTING DIAGNOSES: 1. Exacerbation of chronic obstructive pulmonary disease. 2. Malnutrition. 3. Generalized weakness/failure to thrive. 4. Coronary artery disease.  History of silent inferior wall MI in the     past. 5. Hypertension. 6. History of probable CA of the lung.  Workup refused. 7. Dementia. 8. History of cerebrovascular accident.  DISCHARGE DIAGNOSES: 1. Status post exacerbation of chronic obstructive pulmonary disease. 2. Resolving right pneumonia. 3. Malnutrition. 4. Generalized weakness. 5. Probable CA of lungs. 6. Coronary artery disease, history of silent inferior wall MI. 7. Hypertension. 8. Dementia. 9. History of cerebrovascular accident.  DISCHARGE HOME MEDICATIONS:  Levaquin 250 mg 1 tablet daily for 5 more days, prednisone 10 mg 1 tablet daily, which we will taper off after 1 week.  Tylenol 325 mg 1-2 tablets every 6 hours for pain as needed as before.  Amlodipine 5 mg 1 tablet daily, enteric-coated aspirin 81 mg 1 tablet daily, atorvastatin 10 mg 1 tablet daily, Depakote 250 mg 3 times daily, ferrous sulfate 325 mg daily, lorazepam 0.5 mg 3 times daily as before, metoprolol succinate 25 mg daily, Namenda XR 14 mg daily, Hemorrhoidal Suppositories as before as needed, Zoloft 25 mg 1 tab daily, vitamin D 21308 units 1 capsule every 7 days.  DIET:  Low-salt, low-cholesterol, as tolerated.  ACTIVITY:  As tolerated.  CONDITION AT DISCHARGE:  Stable.  The patient will be transferred to [facility].  BRIEF HISTORY AND HOSPITAL COURSE:  [name] is an 79 year old  female with past medical history significant for coronary artery disease, history of silent inferior wall myocardial infarction, hypertension, history of CVA, dementia, hypercholesteremia, COPD, history of lung nodule refused further workup and evaluation in the past, resident of assisted living.  She came to the ER because of generalized weakness, failure to thrive, poor appetite, and shortness of breath.  In the ER, the patient was noted to be hypoxic with respiratory wheezing.  The patient received breathing treatment, IV steroids, IV antibiotics with minimal improvement.  The patient presently denies any fever or chills. Denies any cough.  Denies chest pain or shortness of breath.  Very little history could be obtained from the patient due to dementia, and no family member was present.  PAST MEDICAL HISTORY:  As above.  PAST SURGICAL HISTORY:  She had hip arthroplasty in the past.  PHYSICAL EXAMINATION:  GENERAL:  She was awake, alert. VITAL SIGNS:  Blood pressure was 148/76, pulse was 56.  She was afebrile. HEENT:  Conjunctivae was pink. NECK:  Supple.  No JVD.  No bruit. LUNGS:  Bilateral expiratory wheezing. CARDIOVASCULAR:  S1, S2 was normal.  There was soft systolic murmur.  No S3 gallop. ABDOMEN:  Soft.  Bowel sounds were present.  Nontender. EXTREMITIES:  There was no clubbing, cyanosis, or edema.  LABORATORY DATA:  Sodium  was 141, potassium 4.0, BUN 25, creatinine 1.17, hemoglobin was 12.2, hematocrit 36.8, white count of 4.6, her glucose was 77.  Repeat electrolytes; sodium was 140, potassium 4.8, BUN 21, creatinine 1.03.  Glucose was 147 on steroids.  Urinalysis was negative.  Chest x-ray showed progressive patchy right upper lobe airspace disease suggestive of pneumonia.  Repeat chest x-ray done today showed mass opacity in bilateral lungs, right mass appears slightly enlarged prior to CT done in 2010.  Discussed with the patient's daughter at length and patient  regarding lung mass.  The family wanted to be treated medically only.  BRIEF HOSPITAL COURSE:  The patient was admitted to regular floor, was started on IV antibiotics and IV steroids which were tapered and switched to p.o. prednisone.  The patient's wheezing has completely resolved.  The patient remained afebrile during the hospital stay.  Her breathing has improved.  The patient will be transferred back to skilled nursing facility.     Eduardo Osier. Sharyn Lull, M.D.     [facility]/MEDQ  D:  08/30/2012  T:  08/30/2012  Job:  [id number]  cc:   Eduardo Osier. Sharyn Lull, M.D. Fax: [phone]

## 2012-08-30 NOTE — Progress Notes (Signed)
Patient is set to discharge to [facility] today. Patient's daughter, Britta Mccreedy & grandaughter, Angela Adam made aware. Discharge packet given to RN, Connye Burkitt. [facility] will be scheduled for transport once discharge summary is dictated.  Clinical Social Work Department CLINICAL SOCIAL WORK PLACEMENT NOTE 08/30/2012  Patient:  Annette Cantrell, Annette Cantrell  Account Number:  [id number] Admit date:  08/25/2012  Clinical Social Worker:  Orpah Greek  Date/time:  08/29/2012 02:33 PM  Clinical Social Work is seeking post-discharge placement for this patient at the following level of care:   SKILLED NURSING   (*CSW will update this form in Epic as items are completed)   08/29/2012  Patient/family provided with [facility] Department of Clinical Social Work's list of facilities offering this level of care within the geographic area requested by the patient (or if unable, by the patient's family).  08/29/2012  Patient/family informed of their freedom to choose among providers that offer the needed level of care, that participate in Medicare, Medicaid or managed care program needed by the patient, have an available bed and are willing to accept the patient.  08/29/2012  Patient/family informed of [facility]' ownership interest in [facility], as well as of the fact that they are under no obligation to receive care at this facility.  PASARR submitted to EDS on 08/29/2012 PASARR number received from EDS on 08/29/2012  FL2 transmitted to all facilities in geographic area requested by pt/family on  08/29/2012 FL2 transmitted to all facilities within larger geographic area on   Patient informed that his/her managed care company has contracts with or will negotiate with  certain facilities, including the following:     Patient/family informed of bed offers received:  08/29/2012 Patient chooses bed at [facility] Physician recommends and patient chooses bed at    Patient to be  transferred to [facility] on  08/30/2012 Patient to be transferred to facility by [facility]  The following physician request were entered in Epic:   Additional Comments:   Unice Bailey, LCSW [facility] Clinical Social Worker cell #: [phone]

## 2012-09-21 ENCOUNTER — Emergency Department (HOSPITAL_COMMUNITY): Payer: Medicare Other

## 2012-09-21 ENCOUNTER — Emergency Department (HOSPITAL_COMMUNITY)
Admission: EM | Admit: 2012-09-21 | Discharge: 2012-09-21 | Disposition: A | Discharge status: Home / Self Care | Payer: Medicare Other | Attending: Emergency Medicine | Admitting: Emergency Medicine

## 2012-09-21 ENCOUNTER — Encounter (HOSPITAL_COMMUNITY): Payer: Self-pay | Admitting: Emergency Medicine

## 2012-09-21 DIAGNOSIS — F0390 Unspecified dementia without behavioral disturbance: Secondary | ICD-10-CM | POA: Insufficient documentation

## 2012-09-21 DIAGNOSIS — E7800 Pure hypercholesterolemia, unspecified: Secondary | ICD-10-CM | POA: Insufficient documentation

## 2012-09-21 DIAGNOSIS — Y939 Activity, unspecified: Secondary | ICD-10-CM | POA: Insufficient documentation

## 2012-09-21 DIAGNOSIS — Z96649 Presence of unspecified artificial hip joint: Secondary | ICD-10-CM | POA: Insufficient documentation

## 2012-09-21 DIAGNOSIS — Z872 Personal history of diseases of the skin and subcutaneous tissue: Secondary | ICD-10-CM | POA: Insufficient documentation

## 2012-09-21 DIAGNOSIS — Z8673 Personal history of transient ischemic attack (TIA), and cerebral infarction without residual deficits: Secondary | ICD-10-CM | POA: Insufficient documentation

## 2012-09-21 DIAGNOSIS — F17200 Nicotine dependence, unspecified, uncomplicated: Secondary | ICD-10-CM | POA: Insufficient documentation

## 2012-09-21 DIAGNOSIS — Z7982 Long term (current) use of aspirin: Secondary | ICD-10-CM | POA: Insufficient documentation

## 2012-09-21 DIAGNOSIS — R079 Chest pain, unspecified: Secondary | ICD-10-CM | POA: Insufficient documentation

## 2012-09-21 DIAGNOSIS — Y929 Unspecified place or not applicable: Secondary | ICD-10-CM | POA: Insufficient documentation

## 2012-09-21 DIAGNOSIS — Z79899 Other long term (current) drug therapy: Secondary | ICD-10-CM | POA: Insufficient documentation

## 2012-09-21 DIAGNOSIS — Z8619 Personal history of other infectious and parasitic diseases: Secondary | ICD-10-CM | POA: Insufficient documentation

## 2012-09-21 DIAGNOSIS — Z8679 Personal history of other diseases of the circulatory system: Secondary | ICD-10-CM | POA: Insufficient documentation

## 2012-09-21 DIAGNOSIS — S79919A Unspecified injury of unspecified hip, initial encounter: Secondary | ICD-10-CM | POA: Insufficient documentation

## 2012-09-21 DIAGNOSIS — J449 Chronic obstructive pulmonary disease, unspecified: Secondary | ICD-10-CM | POA: Insufficient documentation

## 2012-09-21 DIAGNOSIS — J4489 Other specified chronic obstructive pulmonary disease: Secondary | ICD-10-CM | POA: Insufficient documentation

## 2012-09-21 DIAGNOSIS — W19XXXA Unspecified fall, initial encounter: Secondary | ICD-10-CM | POA: Insufficient documentation

## 2012-09-21 LAB — POCT I-STAT TROPONIN I: Troponin i, poc: 0.01 ng/mL (ref 0.00–0.08)

## 2012-09-21 NOTE — ED Provider Notes (Signed)
CSN: [id number]     Arrival date & time 09/21/12  1191 History   First MD Initiated Contact with Patient 09/21/12 [phone]     Chief Complaint  Patient presents with  . Fall  . Hip Pain    HPI  Patient presents presents EMS from a care facility. Had a fall yesterday. Apparently, and some chest pain last night. Upon arrival of medics the patient denied. The daughter apparently been with her tonight she complains of pain and she was not transported. Today complains of pain again in the hip, so EMS was called she was transported here.  She is unable to participate or provide any history  Past Medical History  Diagnosis Date  . Dementia   . Stroke   . High cholesterol   . Heart disease, unspecified   . LUNG NODULE 03/30/2008    Qualifier: Diagnosis of  By: [name] MD, [name]   . COPD 03/30/2008    Qualifier: Diagnosis of  By: [name] MD, [name]   . Infected sebaceous cyst 01/25/2012  . Herpes zoster 01/30/2012  . History of positive PPD, untreated    Past Surgical History  Procedure Laterality Date  . Hip arthroplasty  01/31/2012    Procedure: ARTHROPLASTY BIPOLAR HIP;  Surgeon: [name], MD;  Location: [facility] OR;  Service: Orthopedics;  Laterality: Right;   Family History  Problem Relation Age of Onset  . Stroke Mother    History  Substance Use Topics  . Smoking status: Current Every Day Smoker -- 0.20 packs/day    Types: Cigarettes  . Smokeless tobacco: Not on file  . Alcohol Use: No   OB History   Grav Para Term Preterm Abortions TAB SAB Ect Mult Living                 Review of Systems  Unable to perform ROS: Dementia    Allergies  Review of patient's allergies indicates no known allergies.  Home Medications   Current Outpatient Rx  Name  Route  Sig  Dispense  Refill  . acetaminophen (TYLENOL) 325 MG tablet   Oral   Take 1-2 tablets (325-650 mg total) by mouth every 6 (six) hours as needed for pain (or Fever >/= 101).         Marland Kitchen amLODipine (NORVASC) 5 MG  tablet   Oral   Take 5 mg by mouth daily.         Marland Kitchen aspirin EC 81 MG tablet   Oral   Take 81 mg by mouth daily.         Marland Kitchen atorvastatin (LIPITOR) 10 MG tablet   Oral   Take 10 mg by mouth at bedtime.          . divalproex (DEPAKOTE SPRINKLE) 125 MG capsule   Oral   Take 250 mg by mouth 3 (three) times daily.         . ferrous sulfate 325 (65 FE) MG tablet   Oral   Take 325 mg by mouth daily with breakfast.         . LORazepam (ATIVAN) 0.5 MG tablet   Oral   Take 0.5 mg by mouth 3 (three) times daily.         . Memantine HCl ER (NAMENDA XR) 14 MG CP24   Oral   Take 14 mg by mouth daily.         . metoprolol succinate (TOPROL-XL) 25 MG 24 hr tablet   Oral  Take 25 mg by mouth daily.         . Nutritional Supplements (NUTRITIONAL SUPPLEMENT PO)   Oral   Take 1 Container by mouth 3 (three) times daily with meals. [employer]         . sertraline (ZOLOFT) 25 MG tablet   Oral   Take 25 mg by mouth daily.         . shark liver oil-cocoa butter (QC HEMORRHOIDAL) 0.25-3-85.5 % suppository   Rectal   Place 1 suppository rectally at bedtime.         . Vitamin D, Ergocalciferol, (DRISDOL) 50000 UNITS CAPS   Oral   Take 1 capsule (50,000 Units total) by mouth every 7 (seven) days.   8 capsule   0    BP 154/66  Pulse 64  Temp(Src) 98.6 F (37 C) (Oral)  Resp 18  SpO2 95% Physical Exam  Constitutional: She appears well-developed and well-nourished. No distress.  Thin stature. Calm at rest.  HENT:  Head: Normocephalic.  Eyes: Conjunctivae are normal. Pupils are equal, round, and reactive to light. No scleral icterus.  Neck: Normal range of motion. Neck supple. No thyromegaly present.  Cardiovascular: Normal rate and regular rhythm.  Exam reveals no gallop and no friction rub.   No murmur heard. Pulmonary/Chest: Effort normal and breath sounds normal. No respiratory distress. She has no wheezes. She has no rales.  Abdominal: Soft. Bowel sounds  are normal. She exhibits no distension. There is no tenderness. There is no rebound.  Musculoskeletal: Normal range of motion.  The right hip is internally rotated the left appears to be in normal position. Unable to lift the leg and elevate the hip she does complain of pain but is not grab towards her hip. As I palpate over the greater trochanter I ask her for her she nods her head. She is not react traumatic with the hip is manipulated  Neurological:  Dementia. Her answers are non-responsive.  Skin: Skin is warm and dry. No rash noted.  Psychiatric:  Agitated with any attempt at physical exam her vital signs blood pressure cuff etc.    ED Course  Procedures (including critical care time) Labs Review  EKG: Indications possible complaint chest pain. Interpretation sinus rhythm right bundle branch block unchanged versus comparison. Comparison is 08/25/2012. No ischemic changes noted Labs Reviewed  POCT I-STAT TROPONIN I   Imaging Review Dg Hip Complete Right  09/21/2012   CLINICAL DATA:  Fall, hip pain.  EXAM: RIGHT HIP - COMPLETE 2+ VIEW  COMPARISON:  01/31/2012  FINDINGS: Changes of right hip replacement. Diffuse osteopenia. No fracture, subluxation or dislocation. Degenerative changes in the visualized lower lumbar spine.  IMPRESSION: No acute bony abnormality.   Electronically Signed   By: Charlett Nose M.D.   On: 09/21/2012 09:30    MDM   1. Dementia    EKG did not show any acute abnormalities. Troponin is 0. I was uncertain if she actually had pain in her chest the report of this was last night. No sign of fracture on the pelvis and hip films. She is able to stand and bear weight and has no pain with range of motion of extremities that is apparent on exam. Plan will be transferred her care facility reevaluation for any other acute abnormalities.    Claudean Kinds, MD 09/21/12 1116

## 2012-09-21 NOTE — ED Notes (Signed)
Bed: ZO10 Expected date:  Expected time:  Means of arrival:  Comments: EMS-fall -hip pain

## 2012-09-21 NOTE — ED Notes (Signed)
Per EMS: Pt from [facility], had unwitnessed fall yesterday.  EMS was called but pt refused to go.  Pt has dementia and is combative.  EMS was called again last night for chest pain but patient's daughter refused to let her go to the hospital.  EMS today was called out d/t rt hip pain from fall yesterday.  Unable to obtain vitals en route d/t pt being combative.

## 2012-09-23 ENCOUNTER — Emergency Department (HOSPITAL_COMMUNITY): Payer: Medicare Other

## 2012-09-23 ENCOUNTER — Emergency Department (HOSPITAL_COMMUNITY)
Admission: EM | Admit: 2012-09-23 | Discharge: 2012-09-23 | Disposition: A | Discharge status: Skilled Nursing Facility | Payer: Medicare Other | Attending: Emergency Medicine | Admitting: Emergency Medicine

## 2012-09-23 ENCOUNTER — Encounter (HOSPITAL_COMMUNITY): Payer: Self-pay | Admitting: Emergency Medicine

## 2012-09-23 DIAGNOSIS — Z8673 Personal history of transient ischemic attack (TIA), and cerebral infarction without residual deficits: Secondary | ICD-10-CM | POA: Insufficient documentation

## 2012-09-23 DIAGNOSIS — Y9389 Activity, other specified: Secondary | ICD-10-CM | POA: Insufficient documentation

## 2012-09-23 DIAGNOSIS — J4489 Other specified chronic obstructive pulmonary disease: Secondary | ICD-10-CM | POA: Insufficient documentation

## 2012-09-23 DIAGNOSIS — F0390 Unspecified dementia without behavioral disturbance: Secondary | ICD-10-CM | POA: Insufficient documentation

## 2012-09-23 DIAGNOSIS — Z8679 Personal history of other diseases of the circulatory system: Secondary | ICD-10-CM | POA: Insufficient documentation

## 2012-09-23 DIAGNOSIS — S8990XA Unspecified injury of unspecified lower leg, initial encounter: Secondary | ICD-10-CM | POA: Insufficient documentation

## 2012-09-23 DIAGNOSIS — Z7982 Long term (current) use of aspirin: Secondary | ICD-10-CM | POA: Insufficient documentation

## 2012-09-23 DIAGNOSIS — Z79899 Other long term (current) drug therapy: Secondary | ICD-10-CM | POA: Insufficient documentation

## 2012-09-23 DIAGNOSIS — I519 Heart disease, unspecified: Secondary | ICD-10-CM | POA: Insufficient documentation

## 2012-09-23 DIAGNOSIS — W010XXA Fall on same level from slipping, tripping and stumbling without subsequent striking against object, initial encounter: Secondary | ICD-10-CM | POA: Insufficient documentation

## 2012-09-23 DIAGNOSIS — Z872 Personal history of diseases of the skin and subcutaneous tissue: Secondary | ICD-10-CM | POA: Insufficient documentation

## 2012-09-23 DIAGNOSIS — S79919A Unspecified injury of unspecified hip, initial encounter: Secondary | ICD-10-CM | POA: Insufficient documentation

## 2012-09-23 DIAGNOSIS — Y9210 Unspecified residential institution as the place of occurrence of the external cause: Secondary | ICD-10-CM | POA: Insufficient documentation

## 2012-09-23 DIAGNOSIS — Z8619 Personal history of other infectious and parasitic diseases: Secondary | ICD-10-CM | POA: Insufficient documentation

## 2012-09-23 DIAGNOSIS — J449 Chronic obstructive pulmonary disease, unspecified: Secondary | ICD-10-CM | POA: Insufficient documentation

## 2012-09-23 DIAGNOSIS — E7800 Pure hypercholesterolemia, unspecified: Secondary | ICD-10-CM | POA: Insufficient documentation

## 2012-09-23 DIAGNOSIS — F17200 Nicotine dependence, unspecified, uncomplicated: Secondary | ICD-10-CM | POA: Insufficient documentation

## 2012-09-23 DIAGNOSIS — Z96649 Presence of unspecified artificial hip joint: Secondary | ICD-10-CM | POA: Insufficient documentation

## 2012-09-23 LAB — URINALYSIS, ROUTINE W REFLEX MICROSCOPIC
Bilirubin Urine: NEGATIVE
Glucose, UA: NEGATIVE mg/dL
Hgb urine dipstick: NEGATIVE
Ketones, ur: 15 mg/dL — AB
Nitrite: NEGATIVE
pH: 7 (ref 5.0–8.0)

## 2012-09-23 NOTE — ED Notes (Signed)
Attempted to ambulate the patient. She weight beared at bedside and refused to take any steps she states she had to sit down because she was scared. Encouraged patient to try to walk and she became angry and refused. Patient was placed back in bed. Patient denied pain.

## 2012-09-23 NOTE — ED Provider Notes (Signed)
Patient here for hip pain. On exam she has no deformity of extremities. She has no pain on rotation of either thigh and no point tenderness along lower extremities  Doug Sou, MD 09/23/12 1523

## 2012-09-23 NOTE — ED Notes (Signed)
Onset 5 days ago tripped fell seen in ED the following fay for right hip and right leg pain. Discharged still complains of pain upon palpation and weight bearing. At rest denies pain.

## 2012-09-23 NOTE — ED Provider Notes (Signed)
CSN: [id number]     Arrival date & time 09/23/12  1107 History   First MD Initiated Contact with Patient 09/23/12 1137     Chief Complaint  Patient presents with  . Fall  . Hip Pain   (Consider location/radiation/quality/duration/timing/severity/associated sxs/prior Treatment) Patient is a 79 y.o. female presenting with fall and hip pain. The history is provided by the nursing home and the EMS personnel. The history is limited by the absence of a caregiver. No language interpreter was used.  Fall The current episode started in the past 7 days. Associated symptoms comments: History is obtained through EMS report and from conversation with Gigi Gin at [facility]. Patient had a mechanical fall on 9/16. It was a witnessed fall. On 9/17 she was transported to [facility] for persistent hip pain on right and x-rays were reportedly negative. The patient is sent back today for persistent pain still, and reluctant to bear weight on that leg. The patient is at her baseline mental status without change, per the Nursing home. No other falls. The patient is unable to provide history due to dementia. .  Hip Pain    Past Medical History  Diagnosis Date  . Dementia   . Stroke   . High cholesterol   . Heart disease, unspecified   . LUNG NODULE 03/30/2008    Qualifier: Diagnosis of  By: [name] MD, [name]   . COPD 03/30/2008    Qualifier: Diagnosis of  By: [name] MD, [name]   . Infected sebaceous cyst 01/25/2012  . Herpes zoster 01/30/2012  . History of positive PPD, untreated    Past Surgical History  Procedure Laterality Date  . Hip arthroplasty  01/31/2012    Procedure: ARTHROPLASTY BIPOLAR HIP;  Surgeon: [name], MD;  Location: [facility] OR;  Service: Orthopedics;  Laterality: Right;   Family History  Problem Relation Age of Onset  . Stroke Mother    History  Substance Use Topics  . Smoking status: Current Every Day Smoker -- 0.20 packs/day    Types: Cigarettes  . Smokeless tobacco: Not  on file  . Alcohol Use: No   OB History   Grav Para Term Preterm Abortions TAB SAB Ect Mult Living                 Review of Systems  Unable to perform ROS: Dementia    Allergies  Review of patient's allergies indicates no known allergies.  Home Medications   Current Outpatient Rx  Name  Route  Sig  Dispense  Refill  . amLODipine (NORVASC) 5 MG tablet   Oral   Take 5 mg by mouth daily.         Marland Kitchen aspirin EC 81 MG tablet   Oral   Take 81 mg by mouth daily.         Marland Kitchen atorvastatin (LIPITOR) 10 MG tablet   Oral   Take 10 mg by mouth at bedtime.          . divalproex (DEPAKOTE SPRINKLE) 125 MG capsule   Oral   Take 250 mg by mouth 3 (three) times daily.         . ferrous sulfate 325 (65 FE) MG tablet   Oral   Take 325 mg by mouth daily with breakfast.         . LORazepam (ATIVAN) 0.5 MG tablet   Oral   Take 0.5 mg by mouth 3 (three) times daily.         Marland Kitchen  Memantine HCl ER (NAMENDA XR) 14 MG CP24   Oral   Take 14 mg by mouth daily.         . metoprolol succinate (TOPROL-XL) 25 MG 24 hr tablet   Oral   Take 25 mg by mouth daily.         . Nutritional Supplements (NUTRITIONAL SUPPLEMENT PO)   Oral   Take 1 Container by mouth 3 (three) times daily with meals. [employer]         . sertraline (ZOLOFT) 25 MG tablet   Oral   Take 25 mg by mouth daily.         . shark liver oil-cocoa butter (QC HEMORRHOIDAL) 0.25-3-85.5 % suppository   Rectal   Place 1 suppository rectally at bedtime.         Marland Kitchen acetaminophen (TYLENOL) 325 MG tablet   Oral   Take 1-2 tablets (325-650 mg total) by mouth every 6 (six) hours as needed for pain (or Fever >/= 101).         . Vitamin D, Ergocalciferol, (DRISDOL) 50000 UNITS CAPS   Oral   Take 1 capsule (50,000 Units total) by mouth every 7 (seven) days.   8 capsule   0    BP 140/100  Pulse 70  Temp(Src) 97.4 F (36.3 C) (Oral)  Resp 16  SpO2 98% Physical Exam  Constitutional: She is oriented to  person, place, and time. She appears well-developed and well-nourished. No distress.  The patient appears irritated and slightly agitated and does not participate in physical exam.   HENT:  Head: Atraumatic.  Eyes: EOM are normal.  Neck: Normal range of motion.  Cardiovascular: Intact distal pulses.   Pulmonary/Chest: Effort normal.  Abdominal: Soft. There is no tenderness.  No obvious tenderness.   Musculoskeletal:  Right hip has well healed linear surgical scar c/w history of ORIF on right. No swelling, discoloration or bony deformity. No external rotation of right lower extremity. Thigh, knee, lower leg and ankle all appear unremarkable and atraumatic. The patient makes no facial reaction and does not withdraw to any palpation or attempt to move the leg.   Neurological: She is alert and oriented to person, place, and time.    ED Course  Procedures (including critical care time) Labs Review Labs Reviewed - No data to display Imaging Review No results found. Dg Chest 2 View  08/30/2012   *RADIOLOGY REPORT*  Clinical Data: Follow up right pneumonia  CHEST - 2 VIEW  Comparison: Chest x-ray August 27, 2012, chest CT March 23, 2008  Findings: There is a 2.1 cm mass-like opacity in the right upper lobe.  This is enlarged compared to prior CT chest of March 23, 2008.  There is questioned nodule/mass of the left perihilar mid lung.  There is no pulmonary edema or pleural effusion.  The aorta is tortuous.  The heart size is normal.  The soft tissues and osseous structures are stable.  IMPRESSION: Mass like opacity in bilateral lungs.  The right lung mass appear enlarged compared prior chest CT of March 23, 2008. Clinical correlation regarding history of lung cancer is recommended.  If clinically indicated, further evaluation with chest CT is suggested.   Original Report Authenticated By: Sherian Rein, M.D.   Dg Chest 2 View  08/27/2012   *RADIOLOGY REPORT*  Clinical Data: Cough.  CHEST - 2 VIEW   Comparison: 08/25/2012  Findings: Patchy nodular airspace infiltrate in the lateral right upper lung has progressed since previous study.  Left lung remains clear.  Heart size upper limits normal.  Tortuous atheromatous ectatic thoracic aorta.  No effusion.  Regional bones unremarkable.  IMPRESSION:  Progressive patchy right upper lobe airspace disease suggesting pneumonia.   Original Report Authenticated By: D. Andria Rhein, MD   Dg Chest 2 View  08/25/2012   *RADIOLOGY REPORT*  Clinical Data: Chest discomfort and weakness  CHEST - 2 VIEW  Comparison: Portable chest x-ray of 01/31/2012  Findings: No active infiltrate or effusion is seen.  The previously noted right upper lobe scarring is most likely obscured by an electrode lead overlying that region.  The lungs are somewhat hyperaerated suggesting emphysema.  Mediastinal contours are stable and mild cardiomegaly is stable as is the descending thoracic aorta which is somewhat ectatic.  The bones are osteopenic diffusely.  IMPRESSION: No active lung disease.  Stable cardiomegaly.   Original Report Authenticated By: Dwyane Dee, M.D.   Dg Hip Complete Right  09/23/2012   CLINICAL DATA:  Fall. Patient combative and confused.  EXAM: RIGHT HIP - COMPLETE 2+ VIEW  COMPARISON:  09/21/2012  FINDINGS: No fracture.  A right hip prosthesis is well-seated and aligned with no evidence of loosening.  The left hip joint is normally space and aligned as are the SI joints and symphysis pubis.  The bones are demineralized. The soft tissues are unremarkable other than vascular calcifications.  IMPRESSION: No fracture or dislocation or acute finding.   Electronically Signed   By: Amie Portland   On: 09/23/2012 12:49   Dg Hip Complete Right  09/21/2012   CLINICAL DATA:  Fall, hip pain.  EXAM: RIGHT HIP - COMPLETE 2+ VIEW  COMPARISON:  01/31/2012  FINDINGS: Changes of right hip replacement. Diffuse osteopenia. No fracture, subluxation or dislocation. Degenerative changes in the  visualized lower lumbar spine.  IMPRESSION: No acute bony abnormality.   Electronically Signed   By: Charlett Nose M.D.   On: 09/21/2012 09:30   Dg Femur Right  09/23/2012   CLINICAL DATA:  Fall. Patient combative and confused.  EXAM: RIGHT FEMUR - 2 VIEW  COMPARISON:  09/21/2012  FINDINGS: No acute fracture.  Right hip prosthesis is well-seated and aligned. There is no evidence of loosening.  Knee joint is normally space and aligned. The bones are diffusely demineralized.  There are vascular calcifications medially. The soft tissues are otherwise unremarkable.  IMPRESSION: No fracture or dislocation or acute finding.   Electronically Signed   By: Amie Portland   On: 09/23/2012 12:51   Dg Tibia/fibula Right  09/23/2012   CLINICAL DATA:  A fall. Patient combative and confused. Hip and leg pain.  EXAM: RIGHT TIBIA AND FIBULA - 2 VIEW  COMPARISON:  None.  FINDINGS: No fracture or bone lesion. The bones are demineralized. The knee and ankle joints are normally aligned. The soft tissues are unremarkable.  IMPRESSION: No fracture or acute finding.   Electronically Signed   By: Amie Portland   On: 09/23/2012 12:51   Ct Head Wo Contrast  08/25/2012   *RADIOLOGY REPORT*  Clinical Data: Generalized weakness.  CT HEAD WITHOUT CONTRAST  Technique:  Contiguous axial images were obtained from the base of the skull through the vertex without contrast.  Comparison: Head CT scan 01/30/2012.  Findings: The brain is atrophic with chronic microvascular ischemic change.  No evidence of acute abnormality including infarct, hemorrhage, mass lesion, mass effect, midline shift or abnormal extra-axial fluid collection.  No hydrocephalus or pneumocephalus. Calvarium intact. Imaged paranasal sinuses mastoid air  cells are clear.  IMPRESSION: No acute finding.  Atrophy and chronic microvascular ischemic change.   Original Report Authenticated By: Holley Dexter, M.D.   Ct Hip Right Wo Contrast  09/23/2012   CLINICAL DATA:  Fall.  Right lower extremity pain. Patient unable to bear weight on the right leg.  EXAM: CT OF THE RIGHT HIP WITHOUT CONTRAST  TECHNIQUE: Multidetector CT imaging was performed according to the standard protocol. Multiplanar CT image reconstructions were also generated.  COMPARISON:  None.  FINDINGS: There is a bipolar right hip hemiarthroplasty. The right obturator ring appears intact. No displaced fracture is identified. The right femoral stem appears intact without evidence of loosening or periprosthetic fracture. Visualized right sacrum and right iliac bone appear normal. Right SI joint degenerative disease. Incidental visualization anatomic pelvis demonstrates calcified fibroids in the uterus. Iliofemoral atherosclerosis is present. Partial visualization of the left pelvic bones scars at the left pubis is partially visualized and no displaced fracture is identified. Soft tissue contusion is present in the subcutaneous fat lateral to the right hip (image 4 series 2).  IMPRESSION: No displaced hip or pelvic fracture. Uncomplicated right hip bipolar arthroplasty. Soft tissue contusion lateral to the right hip.   Electronically Signed   By: Andreas Newport M.D.   On: 09/23/2012 14:37   MDM  No diagnosis found. 1. Right hip contusion  Patient has negative right lower extremity x-rays today, done secondary to continued pain and reluctance to weight bear per nursing home staff. CT hip done after attempt to ambulate patient here failed. Unclear as to whether she is reluctant to stand due to pain, agitation or fear of falling, however, CT is negative for finding to support bony injury. She is stable for discharge.   Discussed findings, exam and x-ray results with Joni Reining at [facility] who requested a UA be done while she is here, which was ordered. Plan to discharge home after results obtained.     Arnoldo Hooker, PA-C 09/23/12 [phone]

## 2012-09-23 NOTE — ED Notes (Signed)
Called NH to give report.

## 2012-09-23 NOTE — ED Notes (Signed)
PA at bedside.

## 2012-09-23 NOTE — ED Notes (Signed)
Patient is confused and fall risk. Red socks and yellow armband are in place.

## 2012-09-23 NOTE — ED Notes (Signed)
[facility] called to transport patient back to NH.

## 2012-09-23 NOTE — ED Provider Notes (Signed)
Medical screening examination/treatment/procedure(s) were conducted as a shared visit with non-physician practitioner(s) and myself.  I personally evaluated the patient during the encounter  Doug Sou, MD 09/23/12 2127

## 2012-09-25 LAB — URINE CULTURE
Colony Count: NO GROWTH
Culture: NO GROWTH

## 2012-11-17 ENCOUNTER — Emergency Department (HOSPITAL_COMMUNITY): Payer: Medicare Other

## 2012-11-17 ENCOUNTER — Inpatient Hospital Stay (HOSPITAL_COMMUNITY)
Admission: EM | Admit: 2012-11-17 | Discharge: 2012-11-18 | DRG: 189 | Disposition: A | Discharge status: Nursing Home (Includes ICF) | Payer: Medicare Other | Attending: Internal Medicine | Admitting: Internal Medicine

## 2012-11-17 ENCOUNTER — Encounter (HOSPITAL_COMMUNITY): Payer: Self-pay | Admitting: Emergency Medicine

## 2012-11-17 DIAGNOSIS — Z7982 Long term (current) use of aspirin: Secondary | ICD-10-CM

## 2012-11-17 DIAGNOSIS — N179 Acute kidney failure, unspecified: Secondary | ICD-10-CM | POA: Diagnosis present

## 2012-11-17 DIAGNOSIS — E7800 Pure hypercholesterolemia, unspecified: Secondary | ICD-10-CM | POA: Diagnosis present

## 2012-11-17 DIAGNOSIS — F17200 Nicotine dependence, unspecified, uncomplicated: Secondary | ICD-10-CM | POA: Diagnosis present

## 2012-11-17 DIAGNOSIS — F0390 Unspecified dementia without behavioral disturbance: Secondary | ICD-10-CM | POA: Diagnosis present

## 2012-11-17 DIAGNOSIS — D509 Iron deficiency anemia, unspecified: Secondary | ICD-10-CM | POA: Diagnosis present

## 2012-11-17 DIAGNOSIS — E785 Hyperlipidemia, unspecified: Secondary | ICD-10-CM | POA: Diagnosis present

## 2012-11-17 DIAGNOSIS — F329 Major depressive disorder, single episode, unspecified: Secondary | ICD-10-CM | POA: Diagnosis present

## 2012-11-17 DIAGNOSIS — J9601 Acute respiratory failure with hypoxia: Secondary | ICD-10-CM | POA: Diagnosis present

## 2012-11-17 DIAGNOSIS — D649 Anemia, unspecified: Secondary | ICD-10-CM | POA: Diagnosis present

## 2012-11-17 DIAGNOSIS — J9600 Acute respiratory failure, unspecified whether with hypoxia or hypercapnia: Principal | ICD-10-CM | POA: Diagnosis present

## 2012-11-17 DIAGNOSIS — Z96649 Presence of unspecified artificial hip joint: Secondary | ICD-10-CM

## 2012-11-17 DIAGNOSIS — J441 Chronic obstructive pulmonary disease with (acute) exacerbation: Secondary | ICD-10-CM | POA: Diagnosis present

## 2012-11-17 DIAGNOSIS — Z8673 Personal history of transient ischemic attack (TIA), and cerebral infarction without residual deficits: Secondary | ICD-10-CM

## 2012-11-17 DIAGNOSIS — F32A Depression, unspecified: Secondary | ICD-10-CM | POA: Diagnosis present

## 2012-11-17 DIAGNOSIS — F3289 Other specified depressive episodes: Secondary | ICD-10-CM | POA: Diagnosis present

## 2012-11-17 DIAGNOSIS — I10 Essential (primary) hypertension: Secondary | ICD-10-CM | POA: Diagnosis present

## 2012-11-17 DIAGNOSIS — Z823 Family history of stroke: Secondary | ICD-10-CM

## 2012-11-17 LAB — POCT I-STAT TROPONIN I

## 2012-11-17 LAB — CBC WITH DIFFERENTIAL/PLATELET
Basophils Relative: 0 % (ref 0–1)
Basophils Relative: 1 % (ref 0–1)
Eosinophils Absolute: 1.2 10*3/uL — ABNORMAL HIGH (ref 0.0–0.7)
Eosinophils Absolute: 1.2 10*3/uL — ABNORMAL HIGH (ref 0.0–0.7)
Eosinophils Relative: 20 % — ABNORMAL HIGH (ref 0–5)
HCT: 34.7 % — ABNORMAL LOW (ref 36.0–46.0)
HCT: 33.9 % — ABNORMAL LOW (ref 36.0–46.0)
Hemoglobin: 11.3 g/dL — ABNORMAL LOW (ref 12.0–15.0)
Hemoglobin: 11 g/dL — ABNORMAL LOW (ref 12.0–15.0)
Lymphs Abs: 2.1 10*3/uL (ref 0.7–4.0)
Lymphs Abs: 2.7 10*3/uL (ref 0.7–4.0)
MCH: 33.9 pg (ref 26.0–34.0)
MCH: 33.7 pg (ref 26.0–34.0)
MCHC: 32.6 g/dL (ref 30.0–36.0)
MCV: 104 fL — ABNORMAL HIGH (ref 78.0–100.0)
Monocytes Absolute: 0.4 10*3/uL (ref 0.1–1.0)
Monocytes Absolute: 0.4 10*3/uL (ref 0.1–1.0)
Monocytes Relative: 7 % (ref 3–12)
Monocytes Relative: 7 % (ref 3–12)
Neutrophils Relative %: 37 % — ABNORMAL LOW (ref 43–77)
RBC: 3.33 MIL/uL — ABNORMAL LOW (ref 3.87–5.11)
RBC: 3.26 MIL/uL — ABNORMAL LOW (ref 3.87–5.11)

## 2012-11-17 LAB — COMPREHENSIVE METABOLIC PANEL
ALT: 8 U/L (ref 0–35)
ALT: 8 U/L (ref 0–35)
AST: 15 U/L (ref 0–37)
AST: 16 U/L (ref 0–37)
Albumin: 3.4 g/dL — ABNORMAL LOW (ref 3.5–5.2)
Albumin: 3.2 g/dL — ABNORMAL LOW (ref 3.5–5.2)
Alkaline Phosphatase: 88 U/L (ref 39–117)
Calcium: 9.9 mg/dL (ref 8.4–10.5)
Creatinine, Ser: 1.34 mg/dL — ABNORMAL HIGH (ref 0.50–1.10)
Glucose, Bld: 90 mg/dL (ref 70–99)
Potassium: 4.2 mEq/L (ref 3.5–5.1)
Sodium: 143 mEq/L (ref 135–145)
Sodium: 144 mEq/L (ref 135–145)
Total Protein: 6.7 g/dL (ref 6.0–8.3)
Total Protein: 6.3 g/dL (ref 6.0–8.3)

## 2012-11-17 LAB — PROTIME-INR: Prothrombin Time: 12.5 seconds (ref 11.6–15.2)

## 2012-11-17 MED ORDER — SODIUM CHLORIDE 0.9 % IV SOLN
INTRAVENOUS | Status: DC
  Administered 2012-11-17 – 2012-11-18 (×2): via INTRAVENOUS

## 2012-11-17 MED ORDER — PREDNISONE 50 MG PO TABS
50.0000 mg | ORAL_TABLET | Freq: Every day | ORAL | Status: DC
  Administered 2012-11-18: 50 mg via ORAL
  Filled 2012-11-17 (×2): qty 1

## 2012-11-17 MED ORDER — ALBUTEROL SULFATE (5 MG/ML) 0.5% IN NEBU: 2.5000 mg | INHALATION_SOLUTION | RESPIRATORY_TRACT | Status: DC | PRN

## 2012-11-17 MED ORDER — MEMANTINE HCL ER 14 MG PO CP24
14.0000 mg | ORAL_CAPSULE | Freq: Every day | ORAL | Status: DC
  Filled 2012-11-17: qty 1

## 2012-11-17 MED ORDER — ACETAMINOPHEN 325 MG PO TABS
650.0000 mg | ORAL_TABLET | Freq: Every morning | ORAL | Status: DC
  Filled 2012-11-17: qty 2

## 2012-11-17 MED ORDER — AMLODIPINE BESYLATE 5 MG PO TABS
5.0000 mg | ORAL_TABLET | Freq: Every day | ORAL | Status: DC
  Filled 2012-11-17: qty 1

## 2012-11-17 MED ORDER — LORAZEPAM 0.5 MG PO TABS
0.5000 mg | ORAL_TABLET | Freq: Three times a day (TID) | ORAL | Status: DC
  Administered 2012-11-17 (×2): 0.5 mg via ORAL
  Filled 2012-11-17 (×2): qty 1

## 2012-11-17 MED ORDER — GUAIFENESIN ER 600 MG PO TB12
600.0000 mg | ORAL_TABLET | Freq: Two times a day (BID) | ORAL | Status: DC
  Administered 2012-11-17: 600 mg via ORAL
  Filled 2012-11-17 (×3): qty 1

## 2012-11-17 MED ORDER — HYDROCODONE-ACETAMINOPHEN 5-325 MG PO TABS: 1.0000 | ORAL_TABLET | ORAL | Status: DC | PRN

## 2012-11-17 MED ORDER — ALBUTEROL SULFATE (5 MG/ML) 0.5% IN NEBU
2.5000 mg | INHALATION_SOLUTION | Freq: Three times a day (TID) | RESPIRATORY_TRACT | Status: DC
  Administered 2012-11-18 (×2): 2.5 mg via RESPIRATORY_TRACT
  Filled 2012-11-17 (×2): qty 0.5

## 2012-11-17 MED ORDER — ATORVASTATIN CALCIUM 10 MG PO TABS
10.0000 mg | ORAL_TABLET | Freq: Every day | ORAL | Status: DC
  Administered 2012-11-17: 10 mg via ORAL
  Filled 2012-11-17 (×2): qty 1

## 2012-11-17 MED ORDER — SERTRALINE HCL 50 MG PO TABS
50.0000 mg | ORAL_TABLET | Freq: Every day | ORAL | Status: DC
  Filled 2012-11-17: qty 1

## 2012-11-17 MED ORDER — VITAMIN D3 25 MCG (1000 UNIT) PO TABS
1000.0000 [IU] | ORAL_TABLET | Freq: Every day | ORAL | Status: DC
  Filled 2012-11-17: qty 1

## 2012-11-17 MED ORDER — METOPROLOL SUCCINATE ER 25 MG PO TB24
25.0000 mg | ORAL_TABLET | Freq: Every day | ORAL | Status: DC
  Filled 2012-11-17: qty 1

## 2012-11-17 MED ORDER — ALBUTEROL SULFATE (5 MG/ML) 0.5% IN NEBU
2.5000 mg | INHALATION_SOLUTION | Freq: Once | RESPIRATORY_TRACT | Status: AC
  Administered 2012-11-17: 2.5 mg via RESPIRATORY_TRACT
  Filled 2012-11-17: qty 0.5

## 2012-11-17 MED ORDER — ONDANSETRON HCL 4 MG PO TABS: 4.0000 mg | ORAL_TABLET | Freq: Four times a day (QID) | ORAL | Status: DC | PRN

## 2012-11-17 MED ORDER — ASPIRIN EC 81 MG PO TBEC
81.0000 mg | DELAYED_RELEASE_TABLET | Freq: Every day | ORAL | Status: DC
  Filled 2012-11-17: qty 1

## 2012-11-17 MED ORDER — IPRATROPIUM BROMIDE 0.02 % IN SOLN
0.5000 mg | Freq: Three times a day (TID) | RESPIRATORY_TRACT | Status: DC
  Administered 2012-11-18 (×2): 0.5 mg via RESPIRATORY_TRACT
  Filled 2012-11-17 (×2): qty 2.5

## 2012-11-17 MED ORDER — FERROUS SULFATE 325 (65 FE) MG PO TABS
325.0000 mg | ORAL_TABLET | Freq: Every day | ORAL | Status: DC
  Administered 2012-11-18: 325 mg via ORAL
  Filled 2012-11-17 (×2): qty 1

## 2012-11-17 MED ORDER — IPRATROPIUM BROMIDE 0.02 % IN SOLN
0.5000 mg | Freq: Four times a day (QID) | RESPIRATORY_TRACT | Status: DC
  Filled 2012-11-17: qty 2.5

## 2012-11-17 MED ORDER — DIVALPROEX SODIUM 125 MG PO CPSP
250.0000 mg | ORAL_CAPSULE | Freq: Four times a day (QID) | ORAL | Status: DC
Start: 2012-11-17 — End: 2012-11-18
  Administered 2012-11-17 – 2012-11-18 (×3): 250 mg via ORAL
  Filled 2012-11-17 (×6): qty 2

## 2012-11-17 MED ORDER — IPRATROPIUM BROMIDE 0.02 % IN SOLN
0.5000 mg | Freq: Once | RESPIRATORY_TRACT | Status: AC
  Administered 2012-11-17: 0.5 mg via RESPIRATORY_TRACT
  Filled 2012-11-17: qty 2.5

## 2012-11-17 MED ORDER — ALBUTEROL SULFATE (5 MG/ML) 0.5% IN NEBU
2.5000 mg | INHALATION_SOLUTION | Freq: Four times a day (QID) | RESPIRATORY_TRACT | Status: DC
  Filled 2012-11-17: qty 0.5

## 2012-11-17 MED ORDER — ONDANSETRON HCL 4 MG/2ML IJ SOLN: 4.0000 mg | Freq: Four times a day (QID) | INTRAMUSCULAR | Status: DC | PRN

## 2012-11-17 NOTE — H&P (Signed)
[facility] History and Physical  Annette Cantrell AVW:[id number] DOB: [birth year] DOA: 11/17/2012  Referring physician: ER physician PCP: [name], MD   Chief Complaint: shortness of breath  HPI:  79 year old female with past medical history of dementia, COPD, hypertension, anemia, from nursing home who presented to [facility] ED 11/17/2012 with worsening shortness of breath and oxygen desaturation in mid 80's on room air. Patient is not a good historian due to her dementia. No reports of stridor but she did have non productive cough. No fevers or chills. In ED, vitals were stable with BP 145/85, HR 65 and oxygen saturation of 91% on 2 L [place]. Her Hgb was 11.3 and creatinine 1.34 on the admission. No acute findings on CXR, stable pulmonary nodule.  Assessment and Plan:  Principal Problem:   Acute respiratory failure with hypoxia - secondary to COPD exacerbation - started albuterol andatrovent every 4 hours scheduled and every 2 hours PRN - prednisone 50 mg daily - no fever or elevated WBC count and CXR essentially normal so abx treatment deferred for now - COPD gold alert ordered - COPD order set in place Active Problems:   HYPERLIPIDEMIA - continue statin therapy   Dementia - continue memantine   Acute renal failure - likely due to dehydration - continue IV fluids - follow up BMP in am   HTN (hypertension) - continue Norvasc and metoprolol   Anemia, iron deficiency  - continue ferrous sulfate supplementation   Depression - continue Zoloft  Radiological Exams on Admission: Dg Chest 2 View 11/17/2012    IMPRESSION: No acute disease.  Stable pulmonary nodule.   Electronically Signed   By: [name] M.D.   On: 11/17/2012 12:09    Code Status: Full Family Communication: Pt at bedside Disposition Plan: Admit for further evaluation  [name], MD  [facility] Pager [phone]  Review of Systems:  Constitutional: Negative for fever, chills and  malaise/fatigue. Negative for diaphoresis.  HENT: Negative for hearing loss, ear pain, nosebleeds, congestion, sore throat, neck pain, tinnitus and ear discharge.   Eyes: Negative for blurred vision, double vision, photophobia, pain, discharge and redness.  Respiratory: per HPI.   Cardiovascular: Negative for chest pain, palpitations, orthopnea, claudication and leg swelling.  Gastrointestinal: Negative for nausea, vomiting and abdominal pain. Negative for heartburn, constipation, blood in stool and melena.  Genitourinary: Negative for dysuria, urgency, frequency, hematuria and flank pain.  Musculoskeletal: Negative for myalgias, back pain, joint pain and falls.  Skin: Negative for itching and rash.  Neurological: Negative for dizziness and weakness. Negative for tingling, tremors, sensory change, speech change, focal weakness, loss of consciousness and headaches.  Endo/Heme/Allergies: Negative for environmental allergies and polydipsia. Does not bruise/bleed easily.  Psychiatric/Behavioral: Negative for suicidal ideas. The patient is not nervous/anxious.      Past Medical History  Diagnosis Date  . Dementia   . Stroke   . High cholesterol   . Heart disease, unspecified   . LUNG NODULE 03/30/2008    Qualifier: Diagnosis of  By: [name] MD, [name]   . COPD 03/30/2008    Qualifier: Diagnosis of  By: [name] MD, [name]   . Infected sebaceous cyst 01/25/2012  . Herpes zoster 01/30/2012  . History of positive PPD, untreated    Past Surgical History  Procedure Laterality Date  . Hip arthroplasty  01/31/2012    Procedure: ARTHROPLASTY BIPOLAR HIP;  Surgeon: [name], MD;  Location: [facility] OR;  Service: Orthopedics;  Laterality: Right;  Social History:  reports that she has been smoking Cigarettes.  She has been smoking about 0.20 packs per day. She does not have any smokeless tobacco history on file. She reports that she does not drink alcohol or use illicit drugs.  No Known  Allergies  Family History  Problem Relation Age of Onset  . Stroke Mother      Prior to Admission medications   Medication Sig Start Date End Date Taking? Authorizing Provider  acetaminophen (TYLENOL) 325 MG tablet Take 650 mg by mouth every morning.   Yes Historical Provider, MD  amLODipine (NORVASC) 5 MG tablet Take 5 mg by mouth daily.   Yes Historical Provider, MD  aspirin EC 81 MG tablet Take 81 mg by mouth daily.   Yes Historical Provider, MD  atorvastatin (LIPITOR) 10 MG tablet Take 10 mg by mouth at bedtime.    Yes Historical Provider, MD  cholecalciferol (VITAMIN D) 1000 UNITS tablet Take 1,000 Units by mouth daily.   Yes Historical Provider, MD  divalproex (DEPAKOTE SPRINKLE) 125 MG capsule Take 250 mg by mouth 4 (four) times daily.    Yes Historical Provider, MD  ferrous sulfate 325 (65 FE) MG tablet Take 325 mg by mouth daily with breakfast.   Yes Historical Provider, MD  LORazepam (ATIVAN) 0.5 MG tablet Take 0.5 mg by mouth 3 (three) times daily.   Yes Historical Provider, MD  Memantine HCl ER (NAMENDA XR) 14 MG CP24 Take 14 mg by mouth daily.   Yes Historical Provider, MD  metoprolol succinate (TOPROL-XL) 25 MG 24 hr tablet Take 25 mg by mouth daily.   Yes Historical Provider, MD  Nutritional Supplements (NUTRITIONAL SUPPLEMENT PO) Take 1 Container by mouth 3 (three) times daily with meals. [employer]   Yes Historical Provider, MD  sertraline (ZOLOFT) 50 MG tablet Take 50 mg by mouth daily.   Yes Historical Provider, MD   Physical Exam: Filed Vitals:   11/17/12 1100 11/17/12 1309 11/17/12 1310 11/17/12 1532  BP: 148/78   145/85  Pulse: 65   70  Temp: 97.7 F (36.5 C)   98.2 F (36.8 C)  TempSrc: Oral   Oral  Resp: 20   18  SpO2: 96% 96% 91% 92%    Physical Exam  Constitutional: Appears in no distress.  HENT: Normocephalic. External right and left ear normal. Dry mucus membranes Eyes: Conjunctivae and EOM are normal. PERRLA, no scleral icterus.  Neck: Normal  ROM. Neck supple. No JVD. No tracheal deviation. No thyromegaly.  CVS: irregular rhythm, rate controlled, S1/S2 appreciated.  Pulmonary: coarse breath sounds, crackles in bases.  Abdominal: Soft. BS +,  no distension, tenderness, rebound or guarding.  Musculoskeletal: Normal range of motion. No edema and no tenderness.  Lymphadenopathy: No lymphadenopathy noted, cervical, inguinal. Neuro: Alert. Normal reflexes, muscle tone coordination. No cranial nerve deficit. Skin: Skin is warm and dry. No rash noted. Not diaphoretic. No erythema. No pallor.  Psychiatric: dementia in pt, unable to assess  Labs on Admission:  Basic Metabolic Panel:  Recent Labs Lab 11/17/12 1212  NA 143  K 4.2  CL 106  CO2 28  GLUCOSE 90  BUN 38*  CREATININE 1.34*  CALCIUM 9.9   Liver Function Tests:  Recent Labs Lab 11/17/12 1212  AST 15  ALT 8  ALKPHOS 90  BILITOT 0.3  PROT 6.7  ALBUMIN 3.4*   No results found for this basename: LIPASE, AMYLASE,  in the last 168 hours No results found for this basename: AMMONIA,  in the last 168 hours CBC:  Recent Labs Lab 11/17/12 1212  WBC 5.9  NEUTROABS 2.2  HGB 11.3*  HCT 34.7*  MCV 104.2*  PLT 190   Cardiac Enzymes: No results found for this basename: CKTOTAL, CKMB, CKMBINDEX, TROPONINI,  in the last 168 hours BNP: No components found with this basename: POCBNP,  CBG: No results found for this basename: GLUCAP,  in the last 168 hours  If 7PM-7AM, please contact night-coverage [email or web address] Password [email or web address] 11/17/2012, 4:40 PM

## 2012-11-17 NOTE — ED Notes (Addendum)
Per EMS. Pt from [facility]. Pt has had cough for last 2 days, pmh emphysema. Staff state pt got sob after coughing fit, and o2 sat 86% after coughing fit. Upon EMS arrival, pt 02 sat 96% on RA. Had chest x ray on 11/7 for congestion. Rhonchi noted by EMs

## 2012-11-17 NOTE — ED Notes (Signed)
Pt refused lab work.  RN Fleet Contras is aware.

## 2012-11-17 NOTE — ED Notes (Signed)
Bed: RU04 Expected date:  Expected time:  Means of arrival:  Comments: EMS-cough/SOB

## 2012-11-17 NOTE — Progress Notes (Signed)
Utilization Review completed.  Adlai Sinning RN CM  

## 2012-11-17 NOTE — ED Provider Notes (Signed)
CSN: [id number]     Arrival date & time 11/17/12  1047 History   First MD Initiated Contact with Patient 11/17/12 1054     Chief Complaint  Patient presents with  . Cough  . Shortness of Breath   (Consider location/radiation/quality/duration/timing/severity/associated sxs/prior Treatment) Patient is a 79 y.o. female presenting with cough and shortness of breath. The history is provided by the patient.  Cough Cough characteristics:  Non-productive Severity:  Mild Onset quality:  Sudden Duration:  1 hour Timing:  Rare Progression:  Resolved Chronicity:  Chronic Smoker: no   Context: weather changes   Context: not animal exposure, not exposure to allergens, not occupational exposure, not sick contacts, not smoke exposure, not upper respiratory infection and not with activity   Relieved by:  Nothing Worsened by:  Nothing tried Ineffective treatments:  None tried Associated symptoms: shortness of breath   Associated symptoms: no chest pain, no chills, no diaphoresis, no fever, no sinus congestion, no sore throat, no weight loss and no wheezing   Shortness of Breath Associated symptoms: cough   Associated symptoms: no chest pain, no diaphoresis, no fever, no sore throat and no wheezing    Patient was at ALF/SNF when she had a coughing fit this morning. It was noted that during this fit she had a  An spO2 of 86%. By the time of arrival in the ED she had not actue distress and was satting 96% on RA.  Past Medical History  Diagnosis Date  . Dementia   . Stroke   . High cholesterol   . Heart disease, unspecified   . LUNG NODULE 03/30/2008    Qualifier: Diagnosis of  By: [name] MD, [name]   . COPD 03/30/2008    Qualifier: Diagnosis of  By: [name] MD, [name]   . Infected sebaceous cyst 01/25/2012  . Herpes zoster 01/30/2012  . History of positive PPD, untreated    Past Surgical History  Procedure Laterality Date  . Hip arthroplasty  01/31/2012    Procedure: ARTHROPLASTY BIPOLAR  HIP;  Surgeon: [name], MD;  Location: [facility] OR;  Service: Orthopedics;  Laterality: Right;   Family History  Problem Relation Age of Onset  . Stroke Mother    History  Substance Use Topics  . Smoking status: Current Every Day Smoker -- 0.20 packs/day    Types: Cigarettes  . Smokeless tobacco: Not on file  . Alcohol Use: No   OB History   Grav Para Term Preterm Abortions TAB SAB Ect Mult Living                 Review of Systems  Constitutional: Negative for fever, chills, weight loss and diaphoresis.  HENT: Negative for sore throat.   Respiratory: Positive for cough and shortness of breath. Negative for wheezing.   Cardiovascular: Negative for chest pain.    Allergies  Review of patient's allergies indicates no known allergies.  Home Medications   Current Outpatient Rx  Name  Route  Sig  Dispense  Refill  . acetaminophen (TYLENOL) 325 MG tablet   Oral   Take 650 mg by mouth every morning.         Marland Kitchen amLODipine (NORVASC) 5 MG tablet   Oral   Take 5 mg by mouth daily.         Marland Kitchen aspirin EC 81 MG tablet   Oral   Take 81 mg by mouth daily.         Marland Kitchen atorvastatin (LIPITOR) 10  MG tablet   Oral   Take 10 mg by mouth at bedtime.          . cholecalciferol (VITAMIN D) 1000 UNITS tablet   Oral   Take 1,000 Units by mouth daily.         . divalproex (DEPAKOTE SPRINKLE) 125 MG capsule   Oral   Take 250 mg by mouth 4 (four) times daily.          . ferrous sulfate 325 (65 FE) MG tablet   Oral   Take 325 mg by mouth daily with breakfast.         . LORazepam (ATIVAN) 0.5 MG tablet   Oral   Take 0.5 mg by mouth 3 (three) times daily.         . Memantine HCl ER (NAMENDA XR) 14 MG CP24   Oral   Take 14 mg by mouth daily.         . metoprolol succinate (TOPROL-XL) 25 MG 24 hr tablet   Oral   Take 25 mg by mouth daily.         . Nutritional Supplements (NUTRITIONAL SUPPLEMENT PO)   Oral   Take 1 Container by mouth 3 (three) times daily with  meals. [employer]         . sertraline (ZOLOFT) 50 MG tablet   Oral   Take 50 mg by mouth daily.          BP 148/78  Pulse 65  Temp(Src) 97.7 F (36.5 C) (Oral)  Resp 20  SpO2 96% Physical Exam  ED Course  Procedures (including critical care time) Labs Review Labs Reviewed  COMPREHENSIVE METABOLIC PANEL - Abnormal; Notable for the following:    BUN 38 (*)    Creatinine, Ser 1.34 (*)    Albumin 3.4 (*)    GFR calc non Af Amer 35 (*)    GFR calc Af Amer 40 (*)    All other components within normal limits  CBC WITH DIFFERENTIAL - Abnormal; Notable for the following:    RBC 3.33 (*)    Hemoglobin 11.3 (*)    HCT 34.7 (*)    MCV 104.2 (*)    Neutrophils Relative % 37 (*)    Eosinophils Relative 20 (*)    Eosinophils Absolute 1.2 (*)    All other components within normal limits  POCT I-STAT TROPONIN I   Imaging Review Dg Chest 2 View  11/17/2012   CLINICAL DATA:  Cough and shortness of breath. Known pulmonary nodule.  EXAM: CHEST  2 VIEW  COMPARISON:  01/31/2012, 12/23/2009, and 02/25/2009 as well as PET-CT dated 04/10/2008  FINDINGS: The heart size and pulmonary vascularity are normal. There are no acute infiltrates or effusions. Ill-defined nodule in the right upper lobe is unchanged since multiple prior exams. No acute osseous abnormality.  IMPRESSION: No acute disease.  Stable pulmonary nodule.   Electronically Signed   By: [name] M.D.   On: 11/17/2012 12:09    EKG Interpretation   None       MDM  No diagnosis found.  1. SOB and COugh The patients SOB and cough improved with breathing treatment. The patient was able to sat 94-96% on RA while at rest in the ED. When ambulated she became very SOB and desaturated. I recommended that the patient be admitted to the hospital for further w/u and therapy. I consulted IM who agree to admit the patient.    Pleas Koch, MD 11/18/12 [phone]

## 2012-11-18 ENCOUNTER — Encounter (HOSPITAL_COMMUNITY): Payer: Self-pay

## 2012-11-18 DIAGNOSIS — F0390 Unspecified dementia without behavioral disturbance: Secondary | ICD-10-CM

## 2012-11-18 LAB — COMPREHENSIVE METABOLIC PANEL
ALT: 7 U/L (ref 0–35)
Alkaline Phosphatase: 77 U/L (ref 39–117)
BUN: 29 mg/dL — ABNORMAL HIGH (ref 6–23)
CO2: 27 mEq/L (ref 19–32)
Calcium: 9.3 mg/dL (ref 8.4–10.5)
Chloride: 110 mEq/L (ref 96–112)
GFR calc Af Amer: 50 mL/min — ABNORMAL LOW (ref >90)
GFR calc non Af Amer: 43 mL/min — ABNORMAL LOW (ref >90)
Glucose, Bld: 82 mg/dL (ref 70–99)
Potassium: 4.1 mEq/L (ref 3.5–5.1)
Sodium: 144 mEq/L (ref 135–145)
Total Bilirubin: 0.2 mg/dL — ABNORMAL LOW (ref 0.3–1.2)

## 2012-11-18 LAB — CBC
HCT: 33.2 % — ABNORMAL LOW (ref 36.0–46.0)
MCHC: 31.9 g/dL (ref 30.0–36.0)
Platelets: 183 10*3/uL (ref 150–400)
RBC: 3.17 MIL/uL — ABNORMAL LOW (ref 3.87–5.11)
RDW: 12.5 % (ref 11.5–15.5)
WBC: 6.9 10*3/uL (ref 4.0–10.5)

## 2012-11-18 LAB — GLUCOSE, CAPILLARY: Glucose-Capillary: 79 mg/dL (ref 70–99)

## 2012-11-18 MED ORDER — PREDNISONE 5 MG PO TABS: 5.0000 mg | ORAL_TABLET | Freq: Every day | ORAL | Status: DC

## 2012-11-18 MED ORDER — ENSURE COMPLETE PO LIQD: 237.0000 mL | Freq: Every day | ORAL | Status: DC

## 2012-11-18 MED ORDER — ALBUTEROL SULFATE HFA 108 (90 BASE) MCG/ACT IN AERS: 2.0000 | INHALATION_SPRAY | RESPIRATORY_TRACT | Status: DC | PRN

## 2012-11-18 MED ORDER — BOOST / RESOURCE BREEZE PO LIQD: 1.0000 | Freq: Every day | ORAL | Status: DC

## 2012-11-18 NOTE — Discharge Summary (Signed)
Physician Discharge Summary  Annette Cantrell ZOX:[id number] DOB: 01-13-1922 DOA: 11/17/2012  PCP: [name], MD  Admit date: 11/17/2012 Discharge date: 11/18/2012  Time spent: 35 minutes  Recommendations for Outpatient Follow-up:  1. Follow up with PCP in 1-2 weeks  Discharge Diagnoses:  Principal Problem:   Acute respiratory failure with hypoxia Active Problems:   HYPERLIPIDEMIA   Dementia   COPD exacerbation   Acute renal failure   HTN (hypertension)   Anemia   Depression   Discharge Condition: Improved  Diet recommendation: Regular  Filed Weights   11/18/12 0514  Weight: 40.597 kg (89 lb 8 oz)    History of present illness:  79 year old female with past medical history of dementia, COPD, hypertension, anemia, from nursing home who presented to [facility] ED 11/17/2012 with worsening shortness of breath and oxygen desaturation in mid 80's on room air. Patient is not a good historian due to her dementia. No reports of stridor but she did have non productive cough. No fevers or chills.  In ED, vitals were stable with BP 145/85, HR 65 and oxygen saturation of 91% on 2 L [place]. Her Hgb was 11.3 and creatinine 1.34 on the admission. No acute findings on CXR, stable pulmonary nodule.  Hospital Course:  The patient was admitted to the floor. She was continued on PRN neb treatments and PO prednisone. By the following morning, the patient was noted to be breathing on room air with O2 sats into the upper 90's. No wheezing on exam. Pt appears comfortable and asks in complete sentences, "Why am I here? I'm never sick. I'm feeling good." Pt is otherwise medically stable at this time for close outpatient follow up . Discharge Exam: Filed Vitals:   11/18/12 0514 11/18/12 0621 11/18/12 0753 11/18/12 1255  BP: 183/85 177/84  168/80  Pulse: 62 65  60  Temp: 98.2 F (36.8 C)   98.4 F (36.9 C)  TempSrc: Axillary   Axillary  Resp: 20   18  Weight: 40.597 kg (89 lb 8 oz)     SpO2: 100%   94% 95%    General: Awake, in nad Cardiovascular: regular, s1, s2 Respiratory: normal resp effort, no wheezing  Discharge Instructions     Medication List         acetaminophen 325 MG tablet  Commonly known as:  TYLENOL  Take 650 mg by mouth every morning.     albuterol 108 (90 BASE) MCG/ACT inhaler  Commonly known as:  PROVENTIL HFA;VENTOLIN HFA  Inhale 2 puffs into the lungs every 4 (four) hours as needed for wheezing or shortness of breath.     amLODipine 5 MG tablet  Commonly known as:  NORVASC  Take 5 mg by mouth daily.     aspirin EC 81 MG tablet  Take 81 mg by mouth daily.     atorvastatin 10 MG tablet  Commonly known as:  LIPITOR  Take 10 mg by mouth at bedtime.     cholecalciferol 1000 UNITS tablet  Commonly known as:  VITAMIN D  Take 1,000 Units by mouth daily.     divalproex 125 MG capsule  Commonly known as:  DEPAKOTE SPRINKLE  Take 250 mg by mouth 4 (four) times daily.     ferrous sulfate 325 (65 FE) MG tablet  Take 325 mg by mouth daily with breakfast.     LORazepam 0.5 MG tablet  Commonly known as:  ATIVAN  Take 0.5 mg by mouth 3 (three) times daily.  metoprolol succinate 25 MG 24 hr tablet  Commonly known as:  TOPROL-XL  Take 25 mg by mouth daily.     NAMENDA XR 14 MG Cp24  Generic drug:  Memantine HCl ER  Take 14 mg by mouth daily.     NUTRITIONAL SUPPLEMENT PO  Take 1 Container by mouth 3 (three) times daily with meals. [employer]     predniSONE 5 MG tablet  Commonly known as:  DELTASONE  Take 1 tablet (5 mg total) by mouth daily with breakfast.     sertraline 50 MG tablet  Commonly known as:  ZOLOFT  Take 50 mg by mouth daily.       No Known Allergies    The results of significant diagnostics from this hospitalization (including imaging, microbiology, ancillary and laboratory) are listed below for reference.    Significant Diagnostic Studies: Dg Chest 2 View  11/17/2012   CLINICAL DATA:  Cough and shortness of  breath. Known pulmonary nodule.  EXAM: CHEST  2 VIEW  COMPARISON:  01/31/2012, 12/23/2009, and 02/25/2009 as well as PET-CT dated 04/10/2008  FINDINGS: The heart size and pulmonary vascularity are normal. There are no acute infiltrates or effusions. Ill-defined nodule in the right upper lobe is unchanged since multiple prior exams. No acute osseous abnormality.  IMPRESSION: No acute disease.  Stable pulmonary nodule.   Electronically Signed   By: [name] M.D.   On: 11/17/2012 12:09    Microbiology: No results found for this or any previous visit (from the past 240 hour(s)).   Labs: Basic Metabolic Panel:  Recent Labs Lab 11/17/12 1212 11/17/12 1623 11/18/12 0505  NA 143 144 144  K 4.2 4.2 4.1  CL 106 107 110  CO2 28 28 27   GLUCOSE 90 90 82  BUN 38* 34* 29*  CREATININE 1.34* 1.13* 1.12*  CALCIUM 9.9 9.6 9.3  MG  --  2.2  --   PHOS  --  3.5  --    Liver Function Tests:  Recent Labs Lab 11/17/12 1212 11/17/12 1623 11/18/12 0505  AST 15 16 16   ALT 8 8 7   ALKPHOS 90 88 77  BILITOT 0.3 0.2* 0.2*  PROT 6.7 6.3 5.7*  ALBUMIN 3.4* 3.2* 2.8*   No results found for this basename: LIPASE, AMYLASE,  in the last 168 hours No results found for this basename: AMMONIA,  in the last 168 hours CBC:  Recent Labs Lab 11/17/12 1212 11/17/12 1623 11/18/12 0505  WBC 5.9 6.1 6.9  NEUTROABS 2.2 1.7  --   HGB 11.3* 11.0* 10.6*  HCT 34.7* 33.9* 33.2*  MCV 104.2* 104.0* 104.7*  PLT 190 189 183   Cardiac Enzymes: No results found for this basename: CKTOTAL, CKMB, CKMBINDEX, TROPONINI,  in the last 168 hours BNP: BNP (last 3 results) No results found for this basename: PROBNP,  in the last 8760 hours CBG:  Recent Labs Lab 11/18/12 0702  GLUCAP 79   Signed:  Toyna Erisman K  [facility] 11/18/2012, 1:55 PM

## 2012-11-18 NOTE — Progress Notes (Signed)
INITIAL NUTRITION ASSESSMENT  DOCUMENTATION CODES Per approved criteria  -Severe malnutrition in the context of chronic illness -Underweight   INTERVENTION: Ensure Complete once daily Resource Breeze once daily Encouraged intake of meals and fluids.  NUTRITION DIAGNOSIS: Malnutrition related to sub optimal intake as evidenced by 20% weight loss in the past 9 months and decreased body fat and muscle mass.   Goal: Intake of >90% estimated needs to promote weight gain.  Monitor:  Intake, labs  Reason for Assessment: Consult   79 y.o. female  Admitting Dx: Acute respiratory failure with hypoxia  ASSESSMENT: Patient from a nursing home.  Hx includes dementia, COPD, HTN, and anemia.  Hip fx in January 2014.  Weight loss of 23 lbs (20%) since that time.  Patient is eating lunch.  Tolerating well with good intake.  Reports that she usually eats well but question accuracy with hx of dementia.  "Great shakes" tid at nursing center.  Patient appears thin with decreased body fat and decreased muscle at clavicle region as well as shoulder.  TENS units on and unable to evaluate lower extremities.  Height: Ht Readings from Last 1 Encounters:  08/26/12 5\' 2"  (1.575 m)    Weight: Wt Readings from Last 1 Encounters:  11/18/12 89 lb 8 oz (40.597 kg)    Ideal Body Weight: 110 lbs  % Ideal Body Weight: 81  Wt Readings from Last 10 Encounters:  11/18/12 89 lb 8 oz (40.597 kg)  08/26/12 89 lb 4.6 oz (40.5 kg)  02/04/12 112 lb (50.803 kg)  02/04/12 112 lb (50.803 kg)  12/23/09 100 lb 4 oz (45.473 kg)  07/16/09 103 lb (46.72 kg)  06/27/09 105 lb (47.628 kg)  03/14/09 111 lb 4 oz (50.463 kg)  03/30/08 95 lb 2.1 oz (43.151 kg)    Usual Body Weight: 112 lbs 9 months ago.  % Usual Body Weight: 80  BMI:  Body mass index is 16.37 kg/(m^2).  Estimated Nutritional Needs: Kcal: 1300-1460 Protein: 55-65 gm Fluid: >/=1.2 L  Skin: wnl  Diet Order: General  EDUCATION NEEDS: -No  education needs identified at this time   Intake/Output Summary (Last 24 hours) at 11/18/12 1144 Last data filed at 11/18/12 0655  Gross per 24 hour  Intake 1072.5 ml  Output      0 ml  Net 1072.5 ml     Labs:   Recent Labs Lab 11/17/12 1212 11/17/12 1623 11/18/12 0505  NA 143 144 144  K 4.2 4.2 4.1  CL 106 107 110  CO2 28 28 27   BUN 38* 34* 29*  CREATININE 1.34* 1.13* 1.12*  CALCIUM 9.9 9.6 9.3  MG  --  2.2  --   PHOS  --  3.5  --   GLUCOSE 90 90 82    CBG (last 3)   Recent Labs  11/18/12 0702  GLUCAP 79    Scheduled Meds: . acetaminophen  650 mg Oral q morning - 10a  . albuterol  2.5 mg Nebulization TID  . amLODipine  5 mg Oral Daily  . aspirin EC  81 mg Oral Daily  . atorvastatin  10 mg Oral QHS  . cholecalciferol  1,000 Units Oral Daily  . divalproex  250 mg Oral QID  . ferrous sulfate  325 mg Oral Q breakfast  . guaiFENesin  600 mg Oral BID  . ipratropium  0.5 mg Nebulization TID  . LORazepam  0.5 mg Oral TID  . Memantine HCl ER  14 mg Oral Daily  .  metoprolol succinate  25 mg Oral Daily  . predniSONE  50 mg Oral Q breakfast  . sertraline  50 mg Oral Daily    Continuous Infusions: . sodium chloride 75 mL/hr at 11/18/12 0444    Past Medical History  Diagnosis Date  . Dementia   . Stroke   . High cholesterol   . Heart disease, unspecified   . LUNG NODULE 03/30/2008    Qualifier: Diagnosis of  By: [name] MD, [name]   . COPD 03/30/2008    Qualifier: Diagnosis of  By: [name] MD, [name]   . Infected sebaceous cyst 01/25/2012  . Herpes zoster 01/30/2012  . History of positive PPD, untreated     Past Surgical History  Procedure Laterality Date  . Hip arthroplasty  01/31/2012    Procedure: ARTHROPLASTY BIPOLAR HIP;  Surgeon: [name], MD;  Location: [facility] OR;  Service: Orthopedics;  Laterality: Right;    Oran Rein, RD, LDN Clinical Inpatient Dietitian Pager:  [phone] Weekend and after hours pager:  [phone]

## 2012-11-18 NOTE — Progress Notes (Signed)
Clinical Social Work Department BRIEF PSYCHOSOCIAL ASSESSMENT 11/18/2012  Patient:  Annette Cantrell, Annette Cantrell     Account Number:  [id number]     Admit date:  11/17/2012  Clinical Social Worker:  Dennison Bulla  Date/Time:  11/18/2012 02:30 PM  Referred by:  Physician  Date Referred:  11/18/2012 Referred for  ALF Placement  Other - See comment   Other Referral:   COPD Gold Protocol   Interview type:  Family Other interview type:    PSYCHOSOCIAL DATA Living Status:  FACILITY Admitted from facility:  [facility] Level of care:  Assisted Living Primary support name:  Cristal Primary support relationship to patient:  FAMILY Degree of support available:   Strong    CURRENT CONCERNS Current Concerns  Post-Acute Placement   Other Concerns:    SOCIAL WORK ASSESSMENT / PLAN CSW received referral to assist with DC planning. Per chart review, patient was admitted from ALF. CSW attempted to meet with patient to complete COPD gold protocol and to talk about DC plans but patient is unable to fully participate in assessment. CSW called and spoke with granddtr.    Patient's granddtr reports that she is patient's POA and that patient's dtr lives at [facility] with her as well. Granddtr reports that she is aware of DC back to ALF and is agreeable to plan. Family cannot transport patient due to being at work but is agreeable for [facility] to transport.    CSW faxed DC summary and FL2 to ALF and spoke with Britta Mccreedy who is agreeable to accept patient. Patient, family, and RN aware and agreeable to plans.    CSW coordinated transportation via [facility]. Request # [phone]. CSW is signing off but available if needed.   Assessment/plan status:  Psychosocial Support/Ongoing Assessment of Needs Other assessment/ plan:   Information/referral to community resources:   Will return to ALF    PATIENT'S/FAMILY'S RESPONSE TO PLAN OF CARE: Patient alert but disoriented and unable to participate in assessment.  Family appreciative of call and thanked CSW for updating her on plans. Family agreeable to plans and happy that patient is returning to ALF.        [place], [place] [phone]

## 2012-11-18 NOTE — Evaluation (Signed)
Occupational Therapy Evaluation Patient Details Name: Annette Cantrell MRN: [id number] DOB: 10-Aug-1922 Today's Date: 11/18/2012 Time: 7829-5621 OT Time Calculation (min): 16 min  OT Assessment / Plan / Recommendation History of present illness 79 y.o. female with h/o dementia, lung CA, CVA, hip fx admitted from ALF with PNA, weakness, FTT, COPD exacerbation.    Clinical Impression   Uncertain of pt's PLOF at ALF, but chart notes that she did require assistance at her facility Pt's family planning  For her to return to ALF and physician in to see pt this afternoon and planing for d/c back to ALF. Pt confused and unaware of situation/being in hospital. Pt resistive during session, becoming agitated with bed mobility and refusing to participate in transfers, eval was limited; unable to clearly assess level of assist at this time. Pt not appropriate for acute OT services at this time    OT Assessment  All further OT needs can be met in the next venue of care    Follow Up Recommendations  SNF;Supervision/Assistance - 24 hour    Barriers to Discharge   none  Equipment Recommendations       Recommendations for Other Services    Frequency  Min 2X/week    Precautions / Restrictions Precautions Precautions: Fall Restrictions Weight Bearing Restrictions: No   Pertinent Vitals/Pain No c/o pain    ADL  Grooming: Performed;Wash/dry hands;Wash/dry face;Supervision/safety;Set up Where Assessed - Grooming: Supine, head of bed up Upper Body Bathing: Simulated;Minimal assistance Where Assessed - Upper Body Bathing: Supine, head of bed up Lower Body Bathing: Maximal assistance Upper Body Dressing: Performed;Minimal assistance Where Assessed - Upper Body Dressing: Supine, head of bed up Lower Body Dressing: +1 Total assistance Transfers/Ambulation Related to ADLs: pt refused any transfers, became agitated when sitting EOB ADL Comments: uncertain of pt's PLOF for ADLs, pt confused asking for  her mother and didn't realize she was in hospital. Pt  A & O to self. Pt's nurse and physician in to see her during session    OT Diagnosis: Generalized weakness  OT Problem List: Decreased strength;Decreased cognition;Decreased activity tolerance OT Treatment Interventions: Self-care/ADL training;Therapeutic exercise;Patient/family education;Neuromuscular education;Balance training;Therapeutic activities   OT Goals(Current goals can be found in the care plan section) Acute Rehab OT Goals Patient Stated Goal: none stated OT Goal Formulation: With patient Time For Goal Achievement: 11/25/12 Potential to Achieve Goals: Fair  Visit Information  Last OT Received On: 11/18/12 History of Present Illness: 79 y.o. female with h/o dementia, lung CA, CVA, hip fx admitted from ALF with PNA, weakness, FTT, COPD exacerbation.        Prior Functioning     Home Living Family/patient expects to be discharged to:: Assisted living Home Equipment: Walker - 2 wheels Prior Function Level of Independence: Needs assistance Comments: pt unable to provide accurate this information due dementia, no family present. Pt states that she she lives alone and is independent Communication Communication: No difficulties Dominant Hand: Right         Vision/Perception Vision - History Baseline Vision: Wears glasses all the time Patient Visual Report: No change from baseline Perception Perception: Within Functional Limits   Cognition  Cognition Arousal/Alertness: Awake/alert Behavior During Therapy: Agitated;Anxious Overall Cognitive Status: History of cognitive impairments - at baseline    Extremity/Trunk Assessment Upper Extremity Assessment Upper Extremity Assessment: Overall WFL for tasks assessed;Generalized weakness Lower Extremity Assessment Lower Extremity Assessment: Defer to PT evaluation     Mobility Bed Mobility Bed Mobility: Supine to Sit;Sitting - Scoot to [place]  of Bed;Sit to  Supine Supine to Sit: 3: Mod assist Sitting - Scoot to Edge of Bed: 3: Mod assist Sit to Supine: 5: Supervision Details for Bed Mobility Assistance: pt became agiated with OT trying to assit her to sit EOB althoug she was initially agreeable. Pt sat EOB x 45 secs and returned to supine position with sup Transfers Transfers: Not assessed Details for Transfer Assistance: Pt refused to attempt transfers     Exercise     Balance Balance Balance Assessed: Yes Static Sitting Balance Static Sitting - Balance Support: Feet supported;No upper extremity supported Static Sitting - Level of Assistance: 5: Stand by assistance   End of Session OT - End of Session Patient left: in bed;with bed alarm set Nurse Communication: Mobility status;Other (comment) (behavior during session)  GO     Galen Manila 11/18/2012, 3:03 PM

## 2012-11-22 NOTE — ED Provider Notes (Signed)
I saw and evaluated the patient, reviewed the resident's note and I agree with the findings and plan.  EKG Interpretation    Date/Time:    Ventricular Rate:  60 PR Interval:  179 QRS Duration: 123 QT Interval:  450 QTC Calculation: 450 R Axis:   -26 Text Interpretation:  Sinus or ectopic atrial rhythm Right bundle branch block Inferolateral infarct, old No significant change since last tracing          SOB. Hypoxia. Admitted.   Juliet Rude. Rubin Payor, MD 11/22/12 [phone]

## 2013-01-02 ENCOUNTER — Emergency Department (HOSPITAL_COMMUNITY)
Admission: EM | Admit: 2013-01-02 | Discharge: 2013-01-03 | Disposition: A | Discharge status: Home / Self Care | Payer: Medicare Other | Attending: Emergency Medicine | Admitting: Emergency Medicine

## 2013-01-02 ENCOUNTER — Emergency Department (HOSPITAL_COMMUNITY): Payer: Medicare Other

## 2013-01-02 ENCOUNTER — Encounter (HOSPITAL_COMMUNITY): Payer: Self-pay | Admitting: Emergency Medicine

## 2013-01-02 DIAGNOSIS — Z8619 Personal history of other infectious and parasitic diseases: Secondary | ICD-10-CM | POA: Insufficient documentation

## 2013-01-02 DIAGNOSIS — Z7982 Long term (current) use of aspirin: Secondary | ICD-10-CM | POA: Insufficient documentation

## 2013-01-02 DIAGNOSIS — J449 Chronic obstructive pulmonary disease, unspecified: Secondary | ICD-10-CM | POA: Insufficient documentation

## 2013-01-02 DIAGNOSIS — Z872 Personal history of diseases of the skin and subcutaneous tissue: Secondary | ICD-10-CM | POA: Insufficient documentation

## 2013-01-02 DIAGNOSIS — T424X5A Adverse effect of benzodiazepines, initial encounter: Secondary | ICD-10-CM | POA: Insufficient documentation

## 2013-01-02 DIAGNOSIS — Z79899 Other long term (current) drug therapy: Secondary | ICD-10-CM | POA: Insufficient documentation

## 2013-01-02 DIAGNOSIS — E7800 Pure hypercholesterolemia, unspecified: Secondary | ICD-10-CM | POA: Insufficient documentation

## 2013-01-02 DIAGNOSIS — F0390 Unspecified dementia without behavioral disturbance: Secondary | ICD-10-CM | POA: Insufficient documentation

## 2013-01-02 DIAGNOSIS — J4489 Other specified chronic obstructive pulmonary disease: Secondary | ICD-10-CM | POA: Insufficient documentation

## 2013-01-02 DIAGNOSIS — F17200 Nicotine dependence, unspecified, uncomplicated: Secondary | ICD-10-CM | POA: Insufficient documentation

## 2013-01-02 DIAGNOSIS — R4182 Altered mental status, unspecified: Secondary | ICD-10-CM | POA: Insufficient documentation

## 2013-01-02 DIAGNOSIS — Z8673 Personal history of transient ischemic attack (TIA), and cerebral infarction without residual deficits: Secondary | ICD-10-CM | POA: Insufficient documentation

## 2013-01-02 LAB — BLOOD GAS, ARTERIAL
Acid-Base Excess: 2.6 mmol/L — ABNORMAL HIGH (ref 0.0–2.0)
FIO2: 0.21 %
O2 Saturation: 95.1 %
TCO2: 24.3 mmol/L (ref 0–100)
pCO2 arterial: 43.6 mmHg (ref 35.0–45.0)
pH, Arterial: 7.411 (ref 7.350–7.450)

## 2013-01-02 LAB — COMPREHENSIVE METABOLIC PANEL
ALT: 16 U/L (ref 0–35)
Alkaline Phosphatase: 88 U/L (ref 39–117)
CO2: 26 mEq/L (ref 19–32)
Calcium: 10.1 mg/dL (ref 8.4–10.5)
Chloride: 106 mEq/L (ref 96–112)
GFR calc Af Amer: 46 mL/min — ABNORMAL LOW (ref >90)
GFR calc non Af Amer: 40 mL/min — ABNORMAL LOW (ref >90)
Glucose, Bld: 90 mg/dL (ref 70–99)
Sodium: 144 mEq/L (ref 135–145)
Total Bilirubin: 0.2 mg/dL — ABNORMAL LOW (ref 0.3–1.2)

## 2013-01-02 LAB — CBC WITH DIFFERENTIAL/PLATELET
Basophils Absolute: 0 10*3/uL (ref 0.0–0.1)
Basophils Relative: 1 % (ref 0–1)
Eosinophils Relative: 9 % — ABNORMAL HIGH (ref 0–5)
Hemoglobin: 14.3 g/dL (ref 12.0–15.0)
MCH: 34 pg (ref 26.0–34.0)
MCHC: 33.2 g/dL (ref 30.0–36.0)
MCV: 102.4 fL — ABNORMAL HIGH (ref 78.0–100.0)
Neutro Abs: 2.3 10*3/uL (ref 1.7–7.7)
Neutrophils Relative %: 34 % — ABNORMAL LOW (ref 43–77)
RDW: 12.1 % (ref 11.5–15.5)

## 2013-01-02 MED ORDER — FLUMAZENIL 0.5 MG/5ML IV SOLN
0.2000 mg | Freq: Once | INTRAVENOUS | Status: AC
  Administered 2013-01-02: 0.2 mg via INTRAVENOUS
  Filled 2013-01-02: qty 5

## 2013-01-02 MED ORDER — ALPRAZOLAM 0.25 MG PO TABS: 0.2500 mg | ORAL_TABLET | Freq: Every evening | ORAL | Status: DC | PRN

## 2013-01-02 NOTE — ED Notes (Signed)
Bed: [facility] Expected date:  Expected time:  Means of arrival:  Comments: [facility] 

## 2013-01-02 NOTE — ED Notes (Signed)
MD at bedside. 

## 2013-01-02 NOTE — ED Notes (Signed)
Nursing facility reports that pt "continues to drop her head, confused more than normal."  Baseline- able to walk with assistance, dementia present.

## 2013-01-02 NOTE — ED Provider Notes (Signed)
CSN: [id number]     Arrival date & time 01/02/13  1839 History   First MD Initiated Contact with Patient 01/02/13 1904     Chief Complaint  Patient presents with  . Altered Mental Status   HPI Nursing facility reports that pt "continues to drop her head, confused more than normal." Baseline- able to walk with assistance, dementia present.  Past Medical History  Diagnosis Date  . Dementia   . Stroke   . High cholesterol   . Heart disease, unspecified   . LUNG NODULE 03/30/2008    Qualifier: Diagnosis of  By: [name] MD, [name]   . COPD 03/30/2008    Qualifier: Diagnosis of  By: [name] MD, [name]   . Infected sebaceous cyst 01/25/2012  . Herpes zoster 01/30/2012  . History of positive PPD, untreated    Past Surgical History  Procedure Laterality Date  . Hip arthroplasty  01/31/2012    Procedure: ARTHROPLASTY BIPOLAR HIP;  Surgeon: [name], MD;  Location: [facility] OR;  Service: Orthopedics;  Laterality: Right;   Family History  Problem Relation Age of Onset  . Stroke Mother    History  Substance Use Topics  . Smoking status: Current Every Day Smoker -- 0.20 packs/day    Types: Cigarettes  . Smokeless tobacco: Never Used  . Alcohol Use: No   OB History   Grav Para Term Preterm Abortions TAB SAB Ect Mult Living                 Review of Systems  Allergies  Review of patient's allergies indicates no known allergies.  Home Medications   Current Outpatient Rx  Name  Route  Sig  Dispense  Refill  . acetaminophen (TYLENOL) 325 MG tablet   Oral   Take 650 mg by mouth every morning.         Marland Kitchen albuterol (PROVENTIL HFA;VENTOLIN HFA) 108 (90 BASE) MCG/ACT inhaler   Inhalation   Inhale 2 puffs into the lungs every 4 (four) hours as needed for wheezing or shortness of breath.   1 Inhaler   0   . amLODipine (NORVASC) 5 MG tablet   Oral   Take 5 mg by mouth daily.         Marland Kitchen aspirin EC 81 MG tablet   Oral   Take 81 mg by mouth daily.         Marland Kitchen atorvastatin  (LIPITOR) 10 MG tablet   Oral   Take 10 mg by mouth at bedtime.          . cholecalciferol (VITAMIN D) 1000 UNITS tablet   Oral   Take 1,000 Units by mouth daily.         . divalproex (DEPAKOTE SPRINKLE) 125 MG capsule   Oral   Take 250 mg by mouth 4 (four) times daily.          . ferrous sulfate 325 (65 FE) MG tablet   Oral   Take 325 mg by mouth daily with breakfast.         . Memantine HCl ER (NAMENDA XR) 21 MG CP24   Oral   Take 1 tablet by mouth daily.         . metoprolol succinate (TOPROL-XL) 25 MG 24 hr tablet   Oral   Take 25 mg by mouth daily.         . Nutritional Supplements (NUTRITIONAL SUPPLEMENT PO)   Oral   Take 1 Container by mouth 3 (  three) times daily with meals. [employer]         . sertraline (ZOLOFT) 50 MG tablet   Oral   Take 50 mg by mouth daily.         Marland Kitchen ALPRAZolam (XANAX) 0.25 MG tablet   Oral   Take 1 tablet (0.25 mg total) by mouth at bedtime as needed for anxiety.   30 tablet   0    BP 148/66  Pulse 57  Temp(Src) 97.6 F (36.4 C) (Oral)  Resp 16  SpO2 97% Physical Exam  Nursing note and vitals reviewed. Constitutional: She appears well-developed and well-nourished. She appears lethargic. She is easily aroused. No distress.  HENT:  Head: Normocephalic and atraumatic.  Eyes: Pupils are equal, round, and reactive to light.  Neck: Normal range of motion.  Cardiovascular: Normal rate and intact distal pulses.   Pulmonary/Chest: Effort normal. No respiratory distress.  Abdominal: Normal appearance and bowel sounds are normal. She exhibits no distension. There is no tenderness.  Neurological: She is easily aroused. She appears lethargic. No cranial nerve deficit.  Skin: Skin is warm and dry. No rash noted.    ED Course  Procedures (including critical care time)  Medications  flumazenil (ROMAZICON) injection 0.2 mg (0.2 mg Intravenous Given 01/02/13 2303)   After flumazenil patient immediately awakened and was  vastly more alert than before.  I suspect she is benzodiazepine overdose.  Will hold, then decrease her benzodiazepine dose.  Labs Review Labs Reviewed  BLOOD GAS, ARTERIAL - Abnormal; Notable for the following:    pO2, Arterial 78.0 (*)    Bicarbonate 27.1 (*)    Acid-Base Excess 2.6 (*)    All other components within normal limits  COMPREHENSIVE METABOLIC PANEL - Abnormal; Notable for the following:    BUN 37 (*)    Creatinine, Ser 1.18 (*)    Albumin 3.4 (*)    Total Bilirubin 0.2 (*)    GFR calc non Af Amer 40 (*)    GFR calc Af Amer 46 (*)    All other components within normal limits  CBC WITH DIFFERENTIAL - Abnormal; Notable for the following:    MCV 102.4 (*)    Platelets 125 (*)    Neutrophils Relative % 34 (*)    Lymphocytes Relative 47 (*)    Eosinophils Relative 9 (*)    All other components within normal limits   Imaging Review   DG Chest 2 View (Final result)  Result time: 01/02/13 21:58:04    Final result by Rad Results In Interface (01/02/13 21:58:04)    Narrative:   CLINICAL DATA: Altered mental status  EXAM: CHEST 2 VIEW  COMPARISON: November 17, 2012  FINDINGS: The heart size and mediastinal contours are stable. The aorta is tortuous. 2 x 1.5 cm mass in the right upper lobe is unchanged. There is no focal infiltrate, pulmonary edema, or pleural effusion. The visualized skeletal structures are stable.  IMPRESSION: No active cardiopulmonary disease. Stable right lung mass unchanged compared to prior exam.      MDM   1. Benzodiazepine causing adverse effect in therapeutic use, initial encounter        Nelia Shi, MD 01/05/13 [phone]

## 2013-01-03 NOTE — ED Notes (Signed)
CALLED DISPATCH FOR PTAR

## 2013-03-30 ENCOUNTER — Inpatient Hospital Stay (HOSPITAL_COMMUNITY)
Admission: EM | Admit: 2013-03-30 | Discharge: 2013-04-05 | DRG: 640 | Disposition: A | Discharge status: Hospice | Payer: Medicare Other | Attending: Family Medicine | Admitting: Family Medicine

## 2013-03-30 ENCOUNTER — Emergency Department (HOSPITAL_COMMUNITY): Payer: Medicare Other

## 2013-03-30 DIAGNOSIS — Z515 Encounter for palliative care: Secondary | ICD-10-CM

## 2013-03-30 DIAGNOSIS — E872 Acidosis, unspecified: Secondary | ICD-10-CM | POA: Diagnosis present

## 2013-03-30 DIAGNOSIS — J449 Chronic obstructive pulmonary disease, unspecified: Secondary | ICD-10-CM

## 2013-03-30 DIAGNOSIS — Z66 Do not resuscitate: Secondary | ICD-10-CM | POA: Diagnosis present

## 2013-03-30 DIAGNOSIS — E440 Moderate protein-calorie malnutrition: Secondary | ICD-10-CM | POA: Diagnosis present

## 2013-03-30 DIAGNOSIS — R531 Weakness: Secondary | ICD-10-CM

## 2013-03-30 DIAGNOSIS — Z8673 Personal history of transient ischemic attack (TIA), and cerebral infarction without residual deficits: Secondary | ICD-10-CM

## 2013-03-30 DIAGNOSIS — E7800 Pure hypercholesterolemia, unspecified: Secondary | ICD-10-CM | POA: Diagnosis present

## 2013-03-30 DIAGNOSIS — F32A Depression, unspecified: Secondary | ICD-10-CM

## 2013-03-30 DIAGNOSIS — J9601 Acute respiratory failure with hypoxia: Secondary | ICD-10-CM

## 2013-03-30 DIAGNOSIS — Z79899 Other long term (current) drug therapy: Secondary | ICD-10-CM

## 2013-03-30 DIAGNOSIS — E785 Hyperlipidemia, unspecified: Secondary | ICD-10-CM | POA: Diagnosis present

## 2013-03-30 DIAGNOSIS — I2510 Atherosclerotic heart disease of native coronary artery without angina pectoris: Secondary | ICD-10-CM | POA: Diagnosis present

## 2013-03-30 DIAGNOSIS — J441 Chronic obstructive pulmonary disease with (acute) exacerbation: Secondary | ICD-10-CM | POA: Diagnosis present

## 2013-03-30 DIAGNOSIS — Z96649 Presence of unspecified artificial hip joint: Secondary | ICD-10-CM

## 2013-03-30 DIAGNOSIS — E860 Dehydration: Principal | ICD-10-CM | POA: Diagnosis present

## 2013-03-30 DIAGNOSIS — F0390 Unspecified dementia without behavioral disturbance: Secondary | ICD-10-CM | POA: Diagnosis present

## 2013-03-30 DIAGNOSIS — H109 Unspecified conjunctivitis: Secondary | ICD-10-CM | POA: Diagnosis present

## 2013-03-30 DIAGNOSIS — E43 Unspecified severe protein-calorie malnutrition: Secondary | ICD-10-CM | POA: Diagnosis present

## 2013-03-30 DIAGNOSIS — Z681 Body mass index (BMI) 19 or less, adult: Secondary | ICD-10-CM

## 2013-03-30 DIAGNOSIS — F17200 Nicotine dependence, unspecified, uncomplicated: Secondary | ICD-10-CM | POA: Diagnosis present

## 2013-03-30 DIAGNOSIS — N179 Acute kidney failure, unspecified: Secondary | ICD-10-CM

## 2013-03-30 DIAGNOSIS — R1310 Dysphagia, unspecified: Secondary | ICD-10-CM | POA: Diagnosis present

## 2013-03-30 DIAGNOSIS — D539 Nutritional anemia, unspecified: Secondary | ICD-10-CM | POA: Diagnosis present

## 2013-03-30 DIAGNOSIS — I252 Old myocardial infarction: Secondary | ICD-10-CM

## 2013-03-30 DIAGNOSIS — Z7982 Long term (current) use of aspirin: Secondary | ICD-10-CM

## 2013-03-30 DIAGNOSIS — I10 Essential (primary) hypertension: Secondary | ICD-10-CM | POA: Diagnosis present

## 2013-03-30 DIAGNOSIS — R079 Chest pain, unspecified: Secondary | ICD-10-CM | POA: Diagnosis present

## 2013-03-30 DIAGNOSIS — Z823 Family history of stroke: Secondary | ICD-10-CM

## 2013-03-30 DIAGNOSIS — F329 Major depressive disorder, single episode, unspecified: Secondary | ICD-10-CM

## 2013-03-30 DIAGNOSIS — D696 Thrombocytopenia, unspecified: Secondary | ICD-10-CM | POA: Diagnosis present

## 2013-03-30 DIAGNOSIS — D649 Anemia, unspecified: Secondary | ICD-10-CM

## 2013-03-30 HISTORY — DX: Chronic systolic (congestive) heart failure: I50.22

## 2013-03-30 HISTORY — DX: Atherosclerotic heart disease of native coronary artery without angina pectoris: I25.10

## 2013-03-30 LAB — I-STAT TROPONIN, ED: TROPONIN I, POC: 0 ng/mL (ref 0.00–0.08)

## 2013-03-30 LAB — I-STAT CG4 LACTIC ACID, ED: Lactic Acid, Venous: 5.22 mmol/L — ABNORMAL HIGH (ref 0.5–2.2)

## 2013-03-30 LAB — BASIC METABOLIC PANEL
BUN: 24 mg/dL — AB (ref 6–23)
CO2: 21 mEq/L (ref 19–32)
CREATININE: 1.1 mg/dL (ref 0.50–1.10)
Calcium: 9.7 mg/dL (ref 8.4–10.5)
Chloride: 104 mEq/L (ref 96–112)
GFR, EST AFRICAN AMERICAN: 50 mL/min — AB (ref >90)
GFR, EST NON AFRICAN AMERICAN: 44 mL/min — AB (ref >90)
GLUCOSE: 149 mg/dL — AB (ref 70–99)
Potassium: 3.9 mEq/L (ref 3.7–5.3)
Sodium: 144 mEq/L (ref 137–147)

## 2013-03-30 LAB — URINALYSIS, ROUTINE W REFLEX MICROSCOPIC
Glucose, UA: NEGATIVE mg/dL
HGB URINE DIPSTICK: NEGATIVE
KETONES UR: NEGATIVE mg/dL
NITRITE: NEGATIVE
Protein, ur: NEGATIVE mg/dL
Specific Gravity, Urine: 1.024 (ref 1.005–1.030)
Urobilinogen, UA: 0.2 mg/dL (ref 0.0–1.0)
pH: 5.5 (ref 5.0–8.0)

## 2013-03-30 LAB — URINE MICROSCOPIC-ADD ON

## 2013-03-30 LAB — LACTIC ACID, PLASMA: Lactic Acid, Venous: 4.4 mmol/L — ABNORMAL HIGH (ref 0.5–2.2)

## 2013-03-30 LAB — CBC
HCT: 32.6 % — ABNORMAL LOW (ref 36.0–46.0)
Hemoglobin: 10.5 g/dL — ABNORMAL LOW (ref 12.0–15.0)
MCH: 34.2 pg — AB (ref 26.0–34.0)
MCHC: 32.2 g/dL (ref 30.0–36.0)
MCV: 106.2 fL — ABNORMAL HIGH (ref 78.0–100.0)
Platelets: 109 10*3/uL — ABNORMAL LOW (ref 150–400)
RBC: 3.07 MIL/uL — ABNORMAL LOW (ref 3.87–5.11)
RDW: 13.5 % (ref 11.5–15.5)
WBC: 5.4 10*3/uL (ref 4.0–10.5)

## 2013-03-30 MED ORDER — OLANZAPINE 5 MG PO TBDP
5.0000 mg | ORAL_TABLET | Freq: Every day | ORAL | Status: DC
  Administered 2013-03-30 – 2013-04-04 (×6): 5 mg via ORAL
  Filled 2013-03-30 (×7): qty 1

## 2013-03-30 MED ORDER — IPRATROPIUM-ALBUTEROL 0.5-2.5 (3) MG/3ML IN SOLN
3.0000 mL | Freq: Once | RESPIRATORY_TRACT | Status: AC
  Administered 2013-03-30: 3 mL via RESPIRATORY_TRACT
  Filled 2013-03-30: qty 3

## 2013-03-30 MED ORDER — SODIUM CHLORIDE 0.9 % IV BOLUS (SEPSIS)
1000.0000 mL | Freq: Once | INTRAVENOUS | Status: AC
  Administered 2013-03-30: 1000 mL via INTRAVENOUS

## 2013-03-30 MED ORDER — MIRTAZAPINE 15 MG PO TBDP
15.0000 mg | ORAL_TABLET | Freq: Every day | ORAL | Status: DC
  Administered 2013-03-30 – 2013-04-01 (×3): 15 mg via ORAL
  Filled 2013-03-30 (×4): qty 1

## 2013-03-30 MED ORDER — LORAZEPAM 2 MG/ML IJ SOLN: 1.0000 mg | Freq: Once | INTRAMUSCULAR | Status: DC

## 2013-03-30 MED ORDER — PREDNISONE 20 MG PO TABS
60.0000 mg | ORAL_TABLET | Freq: Once | ORAL | Status: AC
  Administered 2013-03-30: 60 mg via ORAL
  Filled 2013-03-30: qty 3

## 2013-03-30 NOTE — ED Notes (Signed)
Patient with history of COPD began wheezing today, denies history of asthma. EMS gave 5mg  Albuterol breathing treatment and patient is improved.  Also, c/o left mid abdominal pain.  Patient has alzheimer's and lives at [facility].  Staff reports more confusion today than usual.

## 2013-03-30 NOTE — ED Notes (Signed)
Patient adamantly refuses to be stuck for blood, states she has already been stuck today and that she will not allow any more attempts. Paramedics in ambulance attempted to start IV without success. Patient has not been stuck in emergency department and no blood has been drawn. Patient made aware that ED team may not be able to correctly diagnose patient without blood labs and therefore may not be able to treat patient's condition to the best of their ability and therefore may not be able to improve patient's condition. Patient states she understands but that no one may stick her with a needle anymore today. On further attempts to persuade patient to allow RN to perform venipuncture for IV placement, blood draw, and possible future medication administration, patient becomes increasingly agitated and angry.

## 2013-03-30 NOTE — ED Notes (Signed)
Pt. Refused 2 set blood cultures. RN, Luz Lex made aware.

## 2013-03-30 NOTE — ED Provider Notes (Signed)
CSN: [id number]     Arrival date & time 03/30/13  1501 History   First MD Initiated Contact with Patient 03/30/13 1546     Chief Complaint  Patient presents with  . Respiratory Distress     (Consider location/radiation/quality/duration/timing/severity/associated sxs/prior Treatment) HPI  Level V caveat for dementia  This is an 80 year old female with history of dementia, stroke, COPD who presents with wheezing. Per EMS, they noted wheezing and she was given a duo neb in route. Patient has a history of Alzheimer's and is confused at baseline. Per report from the nursing home. She's had no fevers. Patient is noncontributory to history taking she continues to say "why am I here." She is in no acute distress.  Past Medical History  Diagnosis Date  . Dementia   . Stroke   . High cholesterol   . Heart disease, unspecified   . LUNG NODULE 03/30/2008    Qualifier: Diagnosis of  By: [name] MD, [name]   . COPD 03/30/2008    Qualifier: Diagnosis of  By: [name] MD, [name]   . Infected sebaceous cyst 01/25/2012  . Herpes zoster 01/30/2012  . History of positive PPD, untreated    Past Surgical History  Procedure Laterality Date  . Hip arthroplasty  01/31/2012    Procedure: ARTHROPLASTY BIPOLAR HIP;  Surgeon: Rozanna Box, MD;  Location: [facility];  Service: Orthopedics;  Laterality: Right;   Family History  Problem Relation Age of Onset  . Stroke Mother    History  Substance Use Topics  . Smoking status: Current Every Day Smoker -- 0.20 packs/day    Types: Cigarettes  . Smokeless tobacco: Never Used  . Alcohol Use: No   OB History   Grav Para Term Preterm Abortions TAB SAB Ect Mult Living                 Review of Systems  Unable to perform ROS: Dementia      Allergies  Review of patient's allergies indicates no known allergies.  Home Medications   Current Outpatient Rx  Name  Route  Sig  Dispense  Refill  . amLODipine (NORVASC) 5 MG tablet   Oral   Take 5 mg by  mouth daily.         Marland Kitchen aspirin EC 81 MG tablet   Oral   Take 81 mg by mouth daily.         Marland Kitchen atorvastatin (LIPITOR) 10 MG tablet   Oral   Take 10 mg by mouth at bedtime.          . cholecalciferol (VITAMIN D) 1000 UNITS tablet   Oral   Take 1,000 Units by mouth daily.         . divalproex (DEPAKOTE SPRINKLE) 125 MG capsule   Oral   Take 250 mg by mouth 4 (four) times daily.          . ferrous sulfate 325 (65 FE) MG tablet   Oral   Take 325 mg by mouth daily with breakfast.         . Memantine HCl ER (NAMENDA XR) 21 MG CP24   Oral   Take 1 tablet by mouth daily.         . metoprolol succinate (TOPROL-XL) 25 MG 24 hr tablet   Oral   Take 25 mg by mouth daily.         . mirtazapine (REMERON) 15 MG tablet   Oral   Take 7.5 mg by  mouth at bedtime. Takes 1/2 tablet         . Nutritional Supplements (NUTRITIONAL SUPPLEMENT PO)   Oral   Take 1 Container by mouth 3 (three) times daily with meals. [employer]         . potassium chloride (K-DUR,KLOR-CON) 10 MEQ tablet   Oral   Take 10 mEq by mouth every morning.         . sertraline (ZOLOFT) 50 MG tablet   Oral   Take 50 mg by mouth daily.         Marland Kitchen acetaminophen (TYLENOL) 325 MG tablet   Oral   Take 650 mg by mouth every morning.         Marland Kitchen albuterol (PROVENTIL HFA;VENTOLIN HFA) 108 (90 BASE) MCG/ACT inhaler   Inhalation   Inhale 2 puffs into the lungs every 4 (four) hours as needed for wheezing or shortness of breath.   1 Inhaler   0   . ALPRAZolam (XANAX) 0.25 MG tablet   Oral   Take 1 tablet (0.25 mg total) by mouth at bedtime as needed for anxiety.   30 tablet   0    BP 111/53  Pulse 90  Temp(Src) 97.3 F (36.3 C) (Rectal)  Resp 18  SpO2 97% Physical Exam  Nursing note and vitals reviewed. Constitutional: No distress.  Elderly  HENT:  Head: Normocephalic and atraumatic.  Mucous membranes dry  Eyes: Pupils are equal, round, and reactive to light.  Neck: Neck supple.   Cardiovascular: Normal rate, regular rhythm and normal heart sounds.   No murmur heard. Pulmonary/Chest: Effort normal. No respiratory distress. She has wheezes. She has no rales.  Abdominal: Soft. Bowel sounds are normal. There is no tenderness. There is no rebound and no guarding.  Musculoskeletal: She exhibits no edema.  Neurological: She is alert. No cranial nerve deficit.  Patient only oriented to person  Skin: Skin is warm and dry.  Psychiatric:  Agitated    ED Course  Procedures (including critical care time) Labs Review Labs Reviewed  BASIC METABOLIC PANEL - Abnormal; Notable for the following:    Glucose, Bld 149 (*)    BUN 24 (*)    GFR calc non Af Amer 44 (*)    GFR calc Af Amer 50 (*)    All other components within normal limits  CBC - Abnormal; Notable for the following:    RBC 3.07 (*)    Hemoglobin 10.5 (*)    HCT 32.6 (*)    MCV 106.2 (*)    MCH 34.2 (*)    Platelets 109 (*)    All other components within normal limits  URINALYSIS, ROUTINE W REFLEX MICROSCOPIC - Abnormal; Notable for the following:    APPearance CLOUDY (*)    Bilirubin Urine SMALL (*)    Leukocytes, UA MODERATE (*)    All other components within normal limits  URINE MICROSCOPIC-ADD ON - Abnormal; Notable for the following:    Bacteria, UA FEW (*)    All other components within normal limits  LACTIC ACID, PLASMA - Abnormal; Notable for the following:    Lactic Acid, Venous 4.4 (*)    All other components within normal limits  I-STAT CG4 LACTIC ACID, ED - Abnormal; Notable for the following:    Lactic Acid, Venous 5.22 (*)    All other components within normal limits  CULTURE, BLOOD (ROUTINE X 2)  CULTURE, BLOOD (ROUTINE X 2)  HEPATIC FUNCTION PANEL  I-STAT TROPOININ, ED  Imaging Review Dg Chest 2 View (if Patient Has Fever And/or Copd)  03/30/2013   CLINICAL DATA:  Respiratory distress  EXAM: CHEST  2 VIEW  COMPARISON:  01/02/2013  FINDINGS: Mild chronic pulmonary hyperinflation.  No edema, consolidation, effusion, or pneumothorax. There is a 15 mm spiculated nodule in the right upper lung which has been present since at least 2010. No appreciable enlargement. Normal heart size. Chronic aortic tortuosity.  IMPRESSION: COPD without superimposed edema or pneumonia.   Electronically Signed   By: Jorje Guild M.D.   On: 03/30/2013 16:41     EKG Interpretation   Date/Time:  Thursday March 30 2013 15:27:42 EDT Ventricular Rate:  86 PR Interval:    QRS Duration: 142 QT Interval:  396 QTC Calculation: 473 R Axis:   -14 Text Interpretation:  Sinus arrhythmia Right bundle branch block Inferior  infarct , age undetermined Anterolateral infarct , age undetermined  Abnormal ECG Confirmed by HORTON  MD, Anthem ([id number]) on 03/30/2013  4:08:37 PM      MDM   Final diagnoses:  Lactic acidosis  COPD exacerbation    Patient presents from her living facility with wheezing. She is confused at baseline. She is noncontributory to history taking. She does have wheezing on exam. Initial vital signs are reassuring. Patient is afebrile.  Patient was given a duo neb and prednisone. Patient is refusing lab draws. She did not have capacity. Patient was given Zyprexa and continues to be agitated and refusing labral saying that she wanted to go home. Patient was given IM Ativan. IV was placed and labs were drawn. Lab work appears to be patient's baseline with the exception of an elevated lactate of 5.5. This was repeated with a lab lactate that was 4.4.  Patient has no evidence of sepsis and is afebrile. Blood cultures were obtained.  She does appear dry exam but labs aren't baseline without significant other indications of dehydration. Discuss with Dr. Posey Pronto. Will add liver function tests. Patient originally complained of abdominal pain as documented by the triage nurse but did not complain of any to me and had a nontender abdominal exam. Patient will be admitted for further surveillance and  clearing of lactic acidosis. Antibiotic for presumed COPD exacerbation deferred to Dr. Posey Pronto.    Merryl Hacker, MD 03/31/13 [phone]

## 2013-03-30 NOTE — ED Notes (Signed)
Bed: MC94 Expected date:  Expected time:  Means of arrival:  Comments: ems- elderly, COPD, wheezing

## 2013-03-30 NOTE — ED Notes (Signed)
Notified EDP, Horton,MD. Pt. i-stat CG4 Lactic Acid results 5.22.

## 2013-03-31 ENCOUNTER — Encounter (HOSPITAL_COMMUNITY): Payer: Self-pay | Admitting: *Deleted

## 2013-03-31 DIAGNOSIS — F0390 Unspecified dementia without behavioral disturbance: Secondary | ICD-10-CM

## 2013-03-31 DIAGNOSIS — F3289 Other specified depressive episodes: Secondary | ICD-10-CM

## 2013-03-31 DIAGNOSIS — E872 Acidosis, unspecified: Secondary | ICD-10-CM

## 2013-03-31 DIAGNOSIS — D649 Anemia, unspecified: Secondary | ICD-10-CM

## 2013-03-31 DIAGNOSIS — I10 Essential (primary) hypertension: Secondary | ICD-10-CM

## 2013-03-31 DIAGNOSIS — F329 Major depressive disorder, single episode, unspecified: Secondary | ICD-10-CM

## 2013-03-31 DIAGNOSIS — J449 Chronic obstructive pulmonary disease, unspecified: Secondary | ICD-10-CM

## 2013-03-31 DIAGNOSIS — R079 Chest pain, unspecified: Secondary | ICD-10-CM | POA: Diagnosis present

## 2013-03-31 LAB — COMPREHENSIVE METABOLIC PANEL
ALK PHOS: 65 U/L (ref 39–117)
ALT: 8 U/L (ref 0–35)
AST: 18 U/L (ref 0–37)
Albumin: 2.8 g/dL — ABNORMAL LOW (ref 3.5–5.2)
BUN: 22 mg/dL (ref 6–23)
CO2: 22 meq/L (ref 19–32)
Calcium: 9.3 mg/dL (ref 8.4–10.5)
Chloride: 107 mEq/L (ref 96–112)
Creatinine, Ser: 1.08 mg/dL (ref 0.50–1.10)
GFR calc Af Amer: 52 mL/min — ABNORMAL LOW (ref >90)
GFR, EST NON AFRICAN AMERICAN: 44 mL/min — AB (ref >90)
GLUCOSE: 156 mg/dL — AB (ref 70–99)
POTASSIUM: 4.1 meq/L (ref 3.7–5.3)
SODIUM: 145 meq/L (ref 137–147)
TOTAL PROTEIN: 6.2 g/dL (ref 6.0–8.3)

## 2013-03-31 LAB — LACTIC ACID, PLASMA
LACTIC ACID, VENOUS: 5.2 mmol/L — AB (ref 0.5–2.2)
Lactic Acid, Venous: 1.7 mmol/L (ref 0.5–2.2)

## 2013-03-31 LAB — IRON AND TIBC
Iron: 75 ug/dL (ref 42–135)
SATURATION RATIOS: 28 % (ref 20–55)
TIBC: 265 ug/dL (ref 250–470)
UIBC: 190 ug/dL (ref 125–400)

## 2013-03-31 LAB — CBC
HCT: 29.9 % — ABNORMAL LOW (ref 36.0–46.0)
Hemoglobin: 9.8 g/dL — ABNORMAL LOW (ref 12.0–15.0)
MCH: 34.5 pg — ABNORMAL HIGH (ref 26.0–34.0)
MCHC: 32.8 g/dL (ref 30.0–36.0)
MCV: 105.3 fL — ABNORMAL HIGH (ref 78.0–100.0)
PLATELETS: 103 10*3/uL — AB (ref 150–400)
RBC: 2.84 MIL/uL — ABNORMAL LOW (ref 3.87–5.11)
RDW: 13.4 % (ref 11.5–15.5)
WBC: 4.6 10*3/uL (ref 4.0–10.5)

## 2013-03-31 LAB — HEPATIC FUNCTION PANEL
ALBUMIN: 3.1 g/dL — AB (ref 3.5–5.2)
ALK PHOS: 68 U/L (ref 39–117)
ALT: 10 U/L (ref 0–35)
AST: 28 U/L (ref 0–37)
Total Protein: 6.6 g/dL (ref 6.0–8.3)

## 2013-03-31 LAB — TROPONIN I
Troponin I: <0.3 ng/mL (ref <0.30)
Troponin I: <0.3 ng/mL (ref <0.30)

## 2013-03-31 LAB — PROTIME-INR
INR: 1.07 (ref 0.00–1.49)
PROTHROMBIN TIME: 13.7 s (ref 11.6–15.2)

## 2013-03-31 MED ORDER — AMLODIPINE BESYLATE 5 MG PO TABS
5.0000 mg | ORAL_TABLET | Freq: Every day | ORAL | Status: DC
  Administered 2013-03-31 – 2013-04-05 (×6): 5 mg via ORAL
  Filled 2013-03-31 (×6): qty 1

## 2013-03-31 MED ORDER — METOPROLOL SUCCINATE ER 25 MG PO TB24
25.0000 mg | ORAL_TABLET | Freq: Every day | ORAL | Status: DC
  Administered 2013-03-31 – 2013-04-05 (×6): 25 mg via ORAL
  Filled 2013-03-31 (×6): qty 1

## 2013-03-31 MED ORDER — ACETAMINOPHEN 325 MG PO TABS: 650.0000 mg | ORAL_TABLET | Freq: Four times a day (QID) | ORAL | Status: DC | PRN

## 2013-03-31 MED ORDER — ONDANSETRON HCL 4 MG/2ML IJ SOLN: 4.0000 mg | Freq: Four times a day (QID) | INTRAMUSCULAR | Status: DC | PRN

## 2013-03-31 MED ORDER — SERTRALINE HCL 50 MG PO TABS
50.0000 mg | ORAL_TABLET | Freq: Every day | ORAL | Status: DC
  Administered 2013-03-31 – 2013-04-05 (×6): 50 mg via ORAL
  Filled 2013-03-31 (×6): qty 1

## 2013-03-31 MED ORDER — ATORVASTATIN CALCIUM 10 MG PO TABS
10.0000 mg | ORAL_TABLET | Freq: Every day | ORAL | Status: DC
  Administered 2013-03-31 – 2013-04-03 (×5): 10 mg via ORAL
  Filled 2013-03-31 (×7): qty 1

## 2013-03-31 MED ORDER — SODIUM CHLORIDE 0.9 % IV SOLN
INTRAVENOUS | Status: DC
  Administered 2013-03-31 – 2013-04-01 (×3): via INTRAVENOUS

## 2013-03-31 MED ORDER — MEMANTINE HCL ER 7 MG PO CP24
21.0000 mg | ORAL_CAPSULE | Freq: Every day | ORAL | Status: DC
Start: 2013-03-31 — End: 2013-04-04
  Administered 2013-03-31 – 2013-04-04 (×5): 21 mg via ORAL
  Filled 2013-03-31 (×5): qty 3

## 2013-03-31 MED ORDER — ACETAMINOPHEN 650 MG RE SUPP: 650.0000 mg | Freq: Four times a day (QID) | RECTAL | Status: DC | PRN

## 2013-03-31 MED ORDER — DIVALPROEX SODIUM 125 MG PO CPSP
250.0000 mg | ORAL_CAPSULE | Freq: Four times a day (QID) | ORAL | Status: DC
  Administered 2013-03-31 – 2013-04-05 (×22): 250 mg via ORAL
  Filled 2013-03-31 (×24): qty 2

## 2013-03-31 MED ORDER — ONDANSETRON HCL 4 MG PO TABS: 4.0000 mg | ORAL_TABLET | Freq: Four times a day (QID) | ORAL | Status: DC | PRN

## 2013-03-31 MED ORDER — ALBUTEROL SULFATE (2.5 MG/3ML) 0.083% IN NEBU: 3.0000 mL | INHALATION_SOLUTION | RESPIRATORY_TRACT | Status: DC | PRN

## 2013-03-31 MED ORDER — MIRTAZAPINE 7.5 MG PO TABS
7.5000 mg | ORAL_TABLET | Freq: Every day | ORAL | Status: DC
  Administered 2013-03-31 – 2013-04-04 (×6): 7.5 mg via ORAL
  Filled 2013-03-31 (×8): qty 1

## 2013-03-31 MED ORDER — SODIUM CHLORIDE 0.9 % IJ SOLN
3.0000 mL | Freq: Two times a day (BID) | INTRAMUSCULAR | Status: DC
  Administered 2013-03-31 – 2013-04-05 (×6): 3 mL via INTRAVENOUS

## 2013-03-31 MED ORDER — PANTOPRAZOLE SODIUM 40 MG PO TBEC: 40.0000 mg | DELAYED_RELEASE_TABLET | Freq: Every day | ORAL | Status: DC

## 2013-03-31 MED ORDER — PANTOPRAZOLE SODIUM 40 MG PO TBEC
40.0000 mg | DELAYED_RELEASE_TABLET | Freq: Two times a day (BID) | ORAL | Status: DC
  Administered 2013-03-31 – 2013-04-05 (×10): 40 mg via ORAL
  Filled 2013-03-31 (×14): qty 1

## 2013-03-31 MED ORDER — IPRATROPIUM-ALBUTEROL 0.5-2.5 (3) MG/3ML IN SOLN
3.0000 mL | Freq: Three times a day (TID) | RESPIRATORY_TRACT | Status: DC
  Filled 2013-03-31: qty 3

## 2013-03-31 MED ORDER — GI COCKTAIL ~~LOC~~
30.0000 mL | Freq: Three times a day (TID) | ORAL | Status: DC | PRN
  Filled 2013-03-31: qty 30

## 2013-03-31 MED ORDER — HEPARIN SODIUM (PORCINE) 5000 UNIT/ML IJ SOLN
5000.0000 [IU] | Freq: Three times a day (TID) | INTRAMUSCULAR | Status: DC
  Administered 2013-03-31 – 2013-04-01 (×3): 5000 [IU] via SUBCUTANEOUS
  Filled 2013-03-31 (×8): qty 1

## 2013-03-31 MED ORDER — ACETAMINOPHEN 325 MG PO TABS
650.0000 mg | ORAL_TABLET | Freq: Every morning | ORAL | Status: DC
  Administered 2013-03-31 – 2013-04-03 (×4): 650 mg via ORAL
  Filled 2013-03-31 (×5): qty 2

## 2013-03-31 MED ORDER — PREDNISONE 20 MG PO TABS
40.0000 mg | ORAL_TABLET | Freq: Every day | ORAL | Status: DC
  Administered 2013-04-01 – 2013-04-03 (×3): 40 mg via ORAL
  Filled 2013-03-31 (×4): qty 2

## 2013-03-31 MED ORDER — ALPRAZOLAM 0.25 MG PO TABS: 0.2500 mg | ORAL_TABLET | Freq: Every evening | ORAL | Status: DC | PRN

## 2013-03-31 MED ORDER — ENSURE COMPLETE PO LIQD
237.0000 mL | ORAL | Status: DC
  Administered 2013-03-31 – 2013-04-02 (×3): 237 mL via ORAL

## 2013-03-31 MED ORDER — ASPIRIN EC 81 MG PO TBEC
81.0000 mg | DELAYED_RELEASE_TABLET | Freq: Every day | ORAL | Status: DC
  Administered 2013-03-31 – 2013-04-03 (×4): 81 mg via ORAL
  Filled 2013-03-31 (×5): qty 1

## 2013-03-31 NOTE — Progress Notes (Signed)
Patient refused her ABG this morning. Patient is confused, stated "I do not have enough blood left." Notified ordering provider, P. Posey Pronto, MD. Received verbal order to reorder ABG for 9 AM. Will continue to monitor patient.

## 2013-03-31 NOTE — H&P (Addendum)
[facility] History and Physical  Patient: Annette Cantrell  HUT:[id number]  DOB: 1922-05-27  DOS: the patient was seen and examined on 03/31/2013 PCP: Clent Demark, MD  Chief Complaint: Chest tightness  HPI: Annette Cantrell is a 80 y.o. female with Past medical history of severe dementia, CVA, dyslipidemia, COPD. The patient is presenting from [place]. The history is obtained from ED physician, nursing home personnel, documentation. The patient appears poor historian due to her dementia. Although at the time of my evaluation Pt denies any fever, chills, headache, cough, chest pain, palpitation, shortness of breath, orthopnea, PND, nausea, vomiting, abdominal pain, diarrhea, constipation, active bleeding, burning urination, dizziness, pedal edema,  focal neurological deficit.  Patient was brought in by the nursing home personnel as she was complaining of some chest tightness on and off. There was no fever, no chills, no diarrhea, no vomiting, no recent change in her medications. On further workup the patient was found to be having elevated lactic acidosis and hospitalist was asked to admit the patient  The patient is coming from SNF. And at her baseline dependent for most of her  ADL.  Review of Systems: as mentioned in the history of present illness.  A Comprehensive review of the other systems is negative.  Past Medical History  Diagnosis Date  . Dementia   . Stroke   . High cholesterol   . Heart disease, unspecified   . LUNG NODULE 03/30/2008    Qualifier: Diagnosis of  By: [name] MD, [name]   . COPD 03/30/2008    Qualifier: Diagnosis of  By: [name] MD, [name]   . Infected sebaceous cyst 01/25/2012  . Herpes zoster 01/30/2012  . History of positive PPD, untreated    Past Surgical History  Procedure Laterality Date  . Hip arthroplasty  01/31/2012    Procedure: ARTHROPLASTY BIPOLAR HIP;  Surgeon: Rozanna Box, MD;  Location: [place];  Service:  Orthopedics;  Laterality: Right;   Social History:  reports that she has been smoking Cigarettes.  She has been smoking about 0.20 packs per day. She has never used smokeless tobacco. She reports that she does not drink alcohol or use illicit drugs.  No Known Allergies  Family History  Problem Relation Age of Onset  . Stroke Mother     Prior to Admission medications   Medication Sig Start Date End Date Taking? Authorizing Provider  amLODipine (NORVASC) 5 MG tablet Take 5 mg by mouth daily.   Yes Historical Provider, MD  aspirin EC 81 MG tablet Take 81 mg by mouth daily.   Yes Historical Provider, MD  atorvastatin (LIPITOR) 10 MG tablet Take 10 mg by mouth at bedtime.    Yes Historical Provider, MD  cholecalciferol (VITAMIN D) 1000 UNITS tablet Take 1,000 Units by mouth daily.   Yes Historical Provider, MD  divalproex (DEPAKOTE SPRINKLE) 125 MG capsule Take 250 mg by mouth 4 (four) times daily.    Yes Historical Provider, MD  ferrous sulfate 325 (65 FE) MG tablet Take 325 mg by mouth daily with breakfast.   Yes Historical Provider, MD  Memantine HCl ER (NAMENDA XR) 21 MG CP24 Take 1 tablet by mouth daily.   Yes Historical Provider, MD  metoprolol succinate (TOPROL-XL) 25 MG 24 hr tablet Take 25 mg by mouth daily.   Yes Historical Provider, MD  mirtazapine (REMERON) 15 MG tablet Take 7.5 mg by mouth at bedtime. Takes 1/2 tablet   Yes Historical Provider, MD  Nutritional Supplements (NUTRITIONAL  SUPPLEMENT PO) Take 1 Container by mouth 3 (three) times daily with meals. [employer]   Yes Historical Provider, MD  potassium chloride (K-DUR,KLOR-CON) 10 MEQ tablet Take 10 mEq by mouth every morning.   Yes Historical Provider, MD  sertraline (ZOLOFT) 50 MG tablet Take 50 mg by mouth daily.   Yes Historical Provider, MD  acetaminophen (TYLENOL) 325 MG tablet Take 650 mg by mouth every morning.    Historical Provider, MD  albuterol (PROVENTIL HFA;VENTOLIN HFA) 108 (90 BASE) MCG/ACT inhaler Inhale 2  puffs into the lungs every 4 (four) hours as needed for wheezing or shortness of breath. 11/18/12   Donne Hazel, MD  ALPRAZolam Duanne Moron) 0.25 MG tablet Take 1 tablet (0.25 mg total) by mouth at bedtime as needed for anxiety. 01/02/13   Dot Lanes, MD    Physical Exam: Filed Vitals:   03/30/13 2006 03/30/13 2236 03/31/13 0054 03/31/13 0055  BP: 111/53  154/52   Pulse: 90  77   Temp:  97.3 F (36.3 C) 97.3 F (36.3 C)   TempSrc:  Rectal Oral   Resp: 18  16   Height:    5\' 3"  (1.6 m)  Weight:    88.7 kg (195 lb 8.8 oz)  SpO2: 97%  97%     General: Alert, Awake and Oriented to Time, Place and Person. Appear in no distress Eyes: PERRL ENT: Oral Mucosa clear moist. Neck: No JVD Cardiovascular: S1 and S2 Present, no Murmur, Peripheral Pulses Present Respiratory: Bilateral Air entry equal and Decreased, Clear to Auscultation,  No Crackles, no wheezes Abdomen: Bowel Sound Present, Soft and Non tender Skin: No Rash Extremities: No Pedal edema, no calf tenderness Neurologic: Grossly Unremarkable.  Labs on Admission:  CBC:  Recent Labs Lab 03/30/13 2058  WBC 5.4  HGB 10.5*  HCT 32.6*  MCV 106.2*  PLT 109*    CMP     Component Value Date/Time   NA 144 03/30/2013 2058   K 3.9 03/30/2013 2058   CL 104 03/30/2013 2058   CO2 21 03/30/2013 2058   GLUCOSE 149* 03/30/2013 2058   BUN 24* 03/30/2013 2058   CREATININE 1.10 03/30/2013 2058   CALCIUM 9.7 03/30/2013 2058   PROT 6.6 03/30/2013 2058   ALBUMIN 3.1* 03/30/2013 2058   AST 28 03/30/2013 2058   ALT 10 03/30/2013 2058   ALKPHOS 68 03/30/2013 2058   BILITOT <0.2* 03/30/2013 2058   GFRNONAA 44* 03/30/2013 2058   GFRAA 50* 03/30/2013 2058    No results found for this basename: LIPASE, AMYLASE,  in the last 168 hours No results found for this basename: AMMONIA,  in the last 168 hours  No results found for this basename: CKTOTAL, CKMB, CKMBINDEX, TROPONINI,  in the last 168 hours BNP (last 3 results) No results found for this  basename: PROBNP,  in the last 8760 hours  Radiological Exams on Admission: Dg Chest 2 View (if Patient Has Fever And/or Copd)  03/30/2013   CLINICAL DATA:  Respiratory distress  EXAM: CHEST  2 VIEW  COMPARISON:  01/02/2013  FINDINGS: Mild chronic pulmonary hyperinflation. No edema, consolidation, effusion, or pneumothorax. There is a 15 mm spiculated nodule in the right upper lung which has been present since at least 2010. No appreciable enlargement. Normal heart size. Chronic aortic tortuosity.  IMPRESSION: COPD without superimposed edema or pneumonia.   Electronically Signed   By: Jorje Guild M.D.   On: 03/30/2013 16:41    EKG: Independently reviewed. normal sinus rhythm,  old RBBB.  Assessment/Plan Principal Problem:   Lactic acidosis Active Problems:   Dementia   HTN (hypertension)   COPD (chronic obstructive pulmonary disease)   Chest pain   1. Lactic acidosis The patient is presenting with complaints of chest tightness. Her EKG and troponins are negative for any acute ischemia. She is hemodynamically stable does not have tachycardia, tachypnea, hypotension, hypoxia, arrhythmia. Chest x-ray is clear for any acute abnormality. She does not have any renal function abnormality or liver function abnormality, hemoglobin appears stable does not have any leukocytosis Urinalysis is normal She initially had some wheezing which is resolved at present. She does not have any abdominal tenderness on examination nor distention. She is not on metformin With this currently the etiology of her lactic acidosis is unclear. She will be admitted to the hospital for further workup and IV hydration. We will recheck her lactic acid in morning. ABG to be followed. She does not appear to have any signs of infection anywhere, blood culture x1 is obtained, urine analysis clear, chest x-ray clear. She also does not appear to have any active seizure or other metabolic activity.  2. Chest pain At  present she does not appear to have any signs of acute ischemia She will be monitored on telemetry we will obtain limited echocardiogram in the morning, serial troponins She does not appear to have any significant risk factors for PE, no pedal edema, no leg tenderness, normal hypoxia, no tachycardia, no hypertension and ambulates on her own. Chest x-ray is clear for any pneumonia or pneumothorax. Continue to monitor  3. COPD Inhalers and nebulizers as needed  4. Dementia Continue memantine, Remeron, Zoloft  DVT Prophylaxis: subcutaneous Heparin Nutrition: Cardiac  Code Status: Full as per the nursing home personnel  Family Communication: Attempt was made to reach the daughter on the phone which are not successful then granddaughters were attempted to call but no call back.. Disposition: Admitted to inpatient in telemetry unit.  Author: Berle Mull, MD [facility] Pager: [phone] 03/31/2013, 1:36 AM    If 7PM-7AM, please contact night-coverage [email or web address] Password [email or web address]

## 2013-03-31 NOTE — Progress Notes (Signed)
Spoke with pt regarding drawing ordered ABG. Pt very confused, states "I have the flu and don't want you to get it". I told her I would not be affected if she did, but she then went on to say "I don't think its a very good idea, because then I might get something from it". I explained the process again, but she still insists she doesn't want the blood drawn. Pt is still very confused, pulling at her gown, saying she is healed, and doesn't need the gown on any more. I will notify RN. RT will continue to monitor.

## 2013-03-31 NOTE — Progress Notes (Signed)
INITIAL NUTRITION ASSESSMENT  DOCUMENTATION CODES Per approved criteria  -Underweight   INTERVENTION: -Recommend Ensure Complete once daily -Consider diet liberalization to encourage PO intake  -Encouraged PO intake -Assisted with meal ordering -Will continue to monitor  NUTRITION DIAGNOSIS: Increased nutrient needs (protein/kcal) related to increased demand for nutrient  as evidenced by BMI <18.5.   Goal: Pt to meet >/= 90% of their estimated nutrition needs    Monitor:  Total protein/energy intake, labs, weights  Reason for Assessment: Underweight BMI  80 y.o. female  Admitting Dx: Lactic acidosis  ASSESSMENT: Annette Cantrell is a 80 y.o. female with Past medical history of severe dementia, CVA, dyslipidemia, COPD.  The patient is presenting from [place]. The history is obtained from ED physician, nursing home personnel, documentation. The patient appears poor historian due to her dementia.Patient was brought in by the nursing home personnel as she was complaining of some chest tightness on and off  -Pt reported consuming 2 meals/day at nursing home -Denied any changes in appetite or weight pta -Noted some feelings of early satiety, which is expected d/t advanced age. Consumes approximately 50% of meals -Pt receives nutrition supplement "Great Shakes" BID w/lunch and dinner meal. Enjoys shakes and would like to continue with supplement -Will order Ensure Complete as supplement equivalent -Pt ate 5% of breakfast. Assisted pt in ordering healthy balanced meals -Pt with low albumin and total bilirubin -Elevated CBGs likely r/t lactic acidosis -Nutrition Focused Physical Exam:  Subcutaneous Fat:  Orbital Region: WDL Upper Arm Region: moderate Thoracic and Lumbar Region: n/a  Muscle:  Temple Region: WDL Clavicle Bone Region:moderate Clavicle and Acromion Bone Region: moderate Scapular Bone Region: WDL Dorsal Hand: WDL Patellar Region: WDL Anterior  Thigh Region: WDL Posterior Calf Region: WDL  Edema: none present -Wasting likely r/t to aging process    Height: Ht Readings from Last 1 Encounters:  03/31/13 5\' 3"  (1.6 m)    Weight: Wt Readings from Last 1 Encounters:  03/31/13 90 lb 1.6 oz (40.869 kg)    Ideal Body Weight: 115 lbs  % Ideal Body Weight: 78%  Wt Readings from Last 10 Encounters:  03/31/13 90 lb 1.6 oz (40.869 kg)  11/18/12 89 lb 8 oz (40.597 kg)  08/26/12 89 lb 4.6 oz (40.5 kg)  02/04/12 112 lb (50.803 kg)  02/04/12 112 lb (50.803 kg)  12/23/09 100 lb 4 oz (45.473 kg)  07/16/09 103 lb (46.72 kg)  06/27/09 105 lb (47.628 kg)  03/14/09 111 lb 4 oz (50.463 kg)  03/30/08 95 lb 2.1 oz (43.151 kg)    Usual Body Weight: unable to determine d/t pt's dementia  % Usual Body Weight: unable to determine  BMI:  Body mass index is 15.96 kg/(m^2). underweight  Estimated Nutritional Needs: Kcal: 1250-1450  Protein: 40-50 gram Fluid: 1300 ml/daily  Skin: WDL  Diet Order: Cardiac  EDUCATION NEEDS: -No education needs identified at this time   Intake/Output Summary (Last 24 hours) at 03/31/13 1227 Last data filed at 03/31/13 1038  Gross per 24 hour  Intake 444.67 ml  Output      0 ml  Net 444.67 ml    Last BM: 3/26   Labs:   Recent Labs Lab 03/30/13 2058 03/31/13 0220  NA 144 145  K 3.9 4.1  CL 104 107  CO2 21 22  BUN 24* 22  CREATININE 1.10 1.08  CALCIUM 9.7 9.3  GLUCOSE 149* 156*    CBG (last 3)  No results found for this basename:  GLUCAP,  in the last 72 hours  Scheduled Meds: . acetaminophen  650 mg Oral q morning - 10a  . amLODipine  5 mg Oral Daily  . aspirin EC  81 mg Oral Daily  . atorvastatin  10 mg Oral QHS  . divalproex  250 mg Oral QID  . heparin  5,000 Units Subcutaneous 3 times per day  . LORazepam  1 mg Intramuscular Once  . Memantine HCl ER  21 mg Oral Daily  . metoprolol succinate  25 mg Oral Daily  . mirtazapine  15 mg Oral QHS  . mirtazapine  7.5 mg Oral  QHS  . OLANZapine zydis  5 mg Oral QHS  . sertraline  50 mg Oral Daily  . sodium chloride  3 mL Intravenous Q12H    Continuous Infusions: . sodium chloride 100 mL/hr at 03/31/13 0235    Past Medical History  Diagnosis Date  . Dementia   . Stroke   . High cholesterol   . Heart disease, unspecified   . LUNG NODULE 03/30/2008    Qualifier: Diagnosis of  By: [name] MD, [name]   . COPD 03/30/2008    Qualifier: Diagnosis of  By: [name] MD, [name]   . Infected sebaceous cyst 01/25/2012  . Herpes zoster 01/30/2012  . History of positive PPD, untreated     Past Surgical History  Procedure Laterality Date  . Hip arthroplasty  01/31/2012    Procedure: ARTHROPLASTY BIPOLAR HIP;  Surgeon: Rozanna Box, MD;  Location: [facility];  Service: Orthopedics;  Laterality: Right;    Atlee Abide MS RD [place] Clinical Dietitian [phone]

## 2013-03-31 NOTE — Progress Notes (Signed)
[facility] PROGRESS NOTE  Annette Cantrell OXB:[id number] DOB: 18-Jun-1922 DOA: 03/30/2013 PCP: Clent Demark, MD  Assessment/Plan  Lactic acidosis, most likely secondary to dehydration. She denied abdominal pain he did not have tenderness on exam. Her EKG was stable and troponins negative. Chest x-ray was also unremarkable. UA negative. She is not on metformin. Her lactic acidosis resolved quickly with IV fluids.  Atypical chest pain, without signs of acute ischemia. Telemetry demonstrated normal sinus rhythm. Troponins were negative.  She did not have tachycardia or hypoxia to suggest pulmonary embolism, and CXR was negative for infiltrate and pneumothorax.   -  start prn maalox -  ECHO -  F/u last troponin -  Continue telemetry  COPD without SOB but with some wheezing.   -  Vishnu Moeller steroid taper -  Schedule duonebs  Dementia, stable -  Continue memantine, Remeron, Zoloft, depakote, prn xanax   Macrocytic anemia, worsened anemia since earlier this year -  Check iron studies, B12, folate, TSH  Thrombocytopenia, new onset, may be acute phase reactant.  No obvious bleeding.  Low suspicion for DIC/TTP -  Repeat CBC in AM  Moderate protein calorie malnutrition.  Liberalize diet, supplements added.  Appreciate nutrition assistance.  Diet:  Dysphagia 3 Access:  PIV IVF:  yes Proph:  heparin  Code Status: FULL Family Communication: patient alone Disposition Plan: back to [facility] tomorrow    Consultants:  None  Procedures:  CXR  ECHO  Antibiotics:  none   HPI/Subjective:  States she feels well.      Objective: Filed Vitals:   03/31/13 0500 03/31/13 0547 03/31/13 0804 03/31/13 1339  BP:  159/70 156/60 154/67  Pulse:  57 62 63  Temp:  97.4 F (36.3 C) 97.5 F (36.4 C) 97.7 F (36.5 C)  TempSrc:  Oral Oral Oral  Resp:  22 16 18   Height:      Weight: 40.869 kg (90 lb 1.6 oz)     SpO2:  100% 98% 98%    Intake/Output Summary (Last 24 hours) at  03/31/13 1353 Last data filed at 03/31/13 1338  Gross per 24 hour  Intake 464.67 ml  Output      0 ml  Net 464.67 ml   Filed Weights   03/31/13 0055 03/31/13 0500  Weight: 40.234 kg (88 lb 11.2 oz) 40.869 kg (90 lb 1.6 oz)    Exam:   General:  Thin BF, No acute distress  HEENT:  NCAT, MMM  Cardiovascular:  RRR, nl S1, S2 no mrg, 2+ pulses, warm extremities  Respiratory:  Wheeze RLL, otherwise CTAB, no increased WOB  Abdomen:   NABS, soft, NT/ND  MSK:   Normal tone and bulk, no LEE  Neuro:  Grossly intact  Psych:  Pleasantly confused  Data Reviewed: Basic Metabolic Panel:  Recent Labs Lab 03/30/13 2058 03/31/13 0220  NA 144 145  K 3.9 4.1  CL 104 107  CO2 21 22  GLUCOSE 149* 156*  BUN 24* 22  CREATININE 1.10 1.08  CALCIUM 9.7 9.3   Liver Function Tests:  Recent Labs Lab 03/30/13 2058 03/31/13 0220  AST 28 18  ALT 10 8  ALKPHOS 68 65  BILITOT <0.2* <0.2*  PROT 6.6 6.2  ALBUMIN 3.1* 2.8*   No results found for this basename: LIPASE, AMYLASE,  in the last 168 hours No results found for this basename: AMMONIA,  in the last 168 hours CBC:  Recent Labs Lab 03/30/13 2058 03/31/13 0220  WBC 5.4 4.6  HGB 10.5* 9.8*  HCT 32.6* 29.9*  MCV 106.2* 105.3*  PLT 109* 103*   Cardiac Enzymes:  Recent Labs Lab 03/31/13 0220 03/31/13 0830  TROPONINI <0.30 <0.30   BNP (last 3 results) No results found for this basename: PROBNP,  in the last 8760 hours CBG: No results found for this basename: GLUCAP,  in the last 168 hours  No results found for this or any previous visit (from the past 240 hour(s)).   Studies: Dg Chest 2 View (if Patient Has Fever And/or Copd)  03/30/2013   CLINICAL DATA:  Respiratory distress  EXAM: CHEST  2 VIEW  COMPARISON:  01/02/2013  FINDINGS: Mild chronic pulmonary hyperinflation. No edema, consolidation, effusion, or pneumothorax. There is a 15 mm spiculated nodule in the right upper lung which has been present since at  least 2010. No appreciable enlargement. Normal heart size. Chronic aortic tortuosity.  IMPRESSION: COPD without superimposed edema or pneumonia.   Electronically Signed   By: Jorje Guild M.D.   On: 03/30/2013 16:41    Scheduled Meds: . acetaminophen  650 mg Oral q morning - 10a  . amLODipine  5 mg Oral Daily  . aspirin EC  81 mg Oral Daily  . atorvastatin  10 mg Oral QHS  . divalproex  250 mg Oral QID  . feeding supplement (ENSURE COMPLETE)  237 mL Oral Q24H  . heparin  5,000 Units Subcutaneous 3 times per day  . ipratropium-albuterol  3 mL Nebulization TID  . LORazepam  1 mg Intramuscular Once  . Memantine HCl ER  21 mg Oral Daily  . metoprolol succinate  25 mg Oral Daily  . mirtazapine  15 mg Oral QHS  . mirtazapine  7.5 mg Oral QHS  . OLANZapine zydis  5 mg Oral QHS  . [START ON 04/01/2013] predniSONE  40 mg Oral Q breakfast  . sertraline  50 mg Oral Daily  . sodium chloride  3 mL Intravenous Q12H   Continuous Infusions: . sodium chloride 100 mL/hr at 03/31/13 1231    Principal Problem:   Lactic acidosis Active Problems:   Dementia   HTN (hypertension)   COPD (chronic obstructive pulmonary disease)   Chest pain    Time spent: 30 min    Tamee Battin, [facility]  [facility] Pager [phone]. If 7PM-7AM, please contact night-coverage at [email or web address], password [facility] 03/31/2013, 1:53 PM  LOS: 1 day

## 2013-03-31 NOTE — Care Management Note (Addendum)
    Page 1 of 2   04/05/2013     3:18:57 PM   CARE MANAGEMENT NOTE 04/05/2013  Patient:  Annette Cantrell, Annette Cantrell   Account Number:  [id number]  Date Initiated:  03/31/2013  Documentation initiated by:  Dessa Phi  Subjective/Objective Assessment:   80 Y/O F ADMITTED W/LACTIC ACIDOSIS.VE:HMCNOBSJ.     Action/Plan:   FROM [facility] PL[facility].   Anticipated DC Date:  04/05/2013   Anticipated DC Plan:  ASSISTED LIVING / [place]  CM consult      Choice offered to / List presented to:  C-4 Adult Children   DME arranged  HOSPITAL BED      DME agency  [place] arranged  HH-1 RN      [facility] agency  [facility] PALL[facility]ATIVE [facility]ARE OF [place]   Status of service:  Completed, signed off Medicare Important Message given?   (If response is "NO", the following Medicare IM given date fields will be blank) Date Medicare IM given:   Date Additional Medicare IM given:    Discharge Disposition:  ASSISTED LIVING  Per UR Regulation:  Reviewed for med. necessity/level of care/duration of stay  If discussed at [place] of Stay Meetings, dates discussed:   04/04/2013    Comments:  04/05/13 Annalycia Done RN,BSN NCM [phone] D/C BACK TO [facility] W/[facility] SERVICES-MARGIE LIASON AWARE OF D/C & FOLLOWING/[facility] DME LIASON-[place]-[facility] [place] BED.  04/04/13 Miquel Stacks RN,BSN NCM [phone] 4P-SPOKE TO [place] BED IS THE ONLY NEEDED DME.PATIENT HAS RW.TC [place] DME REP [facility] TEL# FOR DELIVERY ARRANGEMENTS.MARGIE [facility] LIASON AWARE,ALSO ARRANGEMENTS FOR DELIVERY OF PATIENT'S EYE GLASSES TO HOSPITAL BEING MADE NOW.  3:35P TC [facility] [address] SPOKE TO JANE HOWELL TEL#[phone] ABOUT BRINGING PATIENT'S EYE GLASSES TO HOSPITAL THEY WILL CONTACT THE FLOOR TO MAKE ARRANGEMENTS FOR DELIVERY OF EYE GLASSES TO HOSPITAL TODAY,NURSE UPDATED.  PMT-DNR, HOME  HOSPICE CHOICE.RECEIVED REFERRAL FOR HOME HOSPICE CHOICE.PER PALLIATIVE-DTR WANTS [facility].TC MARGIE [facility] LIASON,INFORMED ME  TO CONTACT [facility] DIRECTLY TC [phone],YVETTE AWARE OF REFERRAL & LIKELY D/C TOMORROW BACK TO ALF[facility] [place] BED,WILL CHECK WITH [facility] [address] ABOUT DME-RW,3N1.PER NSG PATIENT IS INCONTINENT,BUT ABLE TO AMBULATE.WILL ASK FACILITY IF THEY CAN BRING PATIENT'S [place] GGE#[phone].WILL TRANSFER BACK BY AMBULANCE TRANSP.  04/03/13 Denice Cardon RN,BSN NCM [phone] PROTEIN MAL NUTRITION-RD FOLLOWING,AKI,ES DEMENTIA.IVF@150 .PALLIATIVE CONS.D/C PLAN RETURN ALF.  03/31/13 Velvia Mehrer RN,BSN NCM [phone]

## 2013-03-31 NOTE — Progress Notes (Signed)
Patient is unable to participate in assessment due to mental status. CSW left voicemails for both patient's daughter and granddaughter.  Sheridyn Canino C. Carlton MSW, [place]

## 2013-03-31 NOTE — Progress Notes (Signed)
Pt refused her afternoon neb TX. Pt is very confused. Pt thanks [facility]. Rt could not talk pt in to taking neb. Pt states there nothing wrong with her breathing. Pt in no distress at this time, no WOB, on room air.

## 2013-04-01 ENCOUNTER — Encounter (HOSPITAL_COMMUNITY): Payer: Self-pay | Admitting: Internal Medicine

## 2013-04-01 DIAGNOSIS — D696 Thrombocytopenia, unspecified: Secondary | ICD-10-CM

## 2013-04-01 DIAGNOSIS — E43 Unspecified severe protein-calorie malnutrition: Secondary | ICD-10-CM

## 2013-04-01 DIAGNOSIS — N179 Acute kidney failure, unspecified: Secondary | ICD-10-CM

## 2013-04-01 DIAGNOSIS — I517 Cardiomegaly: Secondary | ICD-10-CM

## 2013-04-01 DIAGNOSIS — J441 Chronic obstructive pulmonary disease with (acute) exacerbation: Secondary | ICD-10-CM

## 2013-04-01 LAB — CBC
HCT: 39.1 % (ref 36.0–46.0)
Hemoglobin: 12.7 g/dL (ref 12.0–15.0)
MCH: 34.3 pg — AB (ref 26.0–34.0)
MCHC: 32.5 g/dL (ref 30.0–36.0)
MCV: 105.7 fL — ABNORMAL HIGH (ref 78.0–100.0)
PLATELETS: 82 10*3/uL — AB (ref 150–400)
RBC: 3.7 MIL/uL — ABNORMAL LOW (ref 3.87–5.11)
RDW: 13.5 % (ref 11.5–15.5)
WBC: 8.8 10*3/uL (ref 4.0–10.5)

## 2013-04-01 LAB — TRANSFERRIN: TRANSFERRIN: 205 mg/dL — AB (ref 200–360)

## 2013-04-01 LAB — TSH: TSH: 0.568 u[IU]/mL (ref 0.350–4.500)

## 2013-04-01 LAB — BASIC METABOLIC PANEL
BUN: 15 mg/dL (ref 6–23)
CALCIUM: 9.9 mg/dL (ref 8.4–10.5)
CO2: 25 meq/L (ref 19–32)
Chloride: 102 mEq/L (ref 96–112)
Creatinine, Ser: 0.87 mg/dL (ref 0.50–1.10)
GFR calc Af Amer: 67 mL/min — ABNORMAL LOW (ref >90)
GFR, EST NON AFRICAN AMERICAN: 58 mL/min — AB (ref >90)
GLUCOSE: 70 mg/dL (ref 70–99)
Potassium: 3.6 mEq/L — ABNORMAL LOW (ref 3.7–5.3)
SODIUM: 142 meq/L (ref 137–147)

## 2013-04-01 LAB — VITAMIN B12: VITAMIN B 12: 1284 pg/mL — AB (ref 211–911)

## 2013-04-01 LAB — DIFFERENTIAL
BASOS ABS: 0 10*3/uL (ref 0.0–0.1)
Basophils Relative: 0 % (ref 0–1)
Eosinophils Absolute: 0.4 10*3/uL (ref 0.0–0.7)
Eosinophils Relative: 5 % (ref 0–5)
LYMPHS PCT: 32 % (ref 12–46)
Lymphs Abs: 2.8 10*3/uL (ref 0.7–4.0)
MONOS PCT: 13 % — AB (ref 3–12)
Monocytes Absolute: 1.1 10*3/uL — ABNORMAL HIGH (ref 0.1–1.0)
Neutro Abs: 4.5 10*3/uL (ref 1.7–7.7)
Neutrophils Relative %: 50 % (ref 43–77)

## 2013-04-01 LAB — LACTIC ACID, PLASMA: Lactic Acid, Venous: 1.5 mmol/L (ref 0.5–2.2)

## 2013-04-01 LAB — FERRITIN: FERRITIN: 88 ng/mL (ref 10–291)

## 2013-04-01 MED ORDER — POLYMYXIN B-TRIMETHOPRIM 10000-0.1 UNIT/ML-% OP SOLN
1.0000 [drp] | Freq: Four times a day (QID) | OPHTHALMIC | Status: DC
  Administered 2013-04-01 – 2013-04-03 (×9): 1 [drp] via OPHTHALMIC
  Filled 2013-04-01: qty 10

## 2013-04-01 MED ORDER — MEGESTROL ACETATE 400 MG/10ML PO SUSP
400.0000 mg | Freq: Every day | ORAL | Status: DC
  Administered 2013-04-01 – 2013-04-03 (×3): 400 mg via ORAL
  Filled 2013-04-01 (×4): qty 10

## 2013-04-01 NOTE — Progress Notes (Addendum)
[facility] PROGRESS NOTE  Annette Cantrell LNL:[id number] DOB: 12/17/1922 DOA: 03/30/2013 PCP: Clent Demark, MD  Assessment/Plan  Dementia, progressive and leading to dehydration and weight loss.  7kg weight loss in last year. -  Continue memantine, Remeron, Zoloft, depakote, prn xanax  -  Provided information to family about dementia and encouraged consideration of hospice.  -  Palliative care consultation  Lactic acidosis, most likely secondary to dehydration. She denied abdominal pain he did not have tenderness on exam. Her EKG was stable and troponins negative. Chest x-ray was also unremarkable. UA negative. She is not on metformin. Her lactic acidosis resolved quickly with IV fluids.  Atypical chest pain, with ST segment depression in anterior leads. Telemetry demonstrated normal sinus rhythm (with RBBB). Troponins were negative.  She did not have tachycardia or hypoxia to suggest pulmonary embolism, and CXR was negative for infiltrate and pneumothorax.   -  Continue prn maalox -  ECHO:  New severe hypokinesis of the inferior wall and inferolateral wall.  -  Troponin neg -  Telemetry:  NSR, bradycardia -  Okay to d/c telemetry  COPD without SOB but no wheezing.   -  Annette Cantrell steroid taper -  Schedule duonebs  Macrocytic anemia, worsened anemia since earlier this year -  Iron studies wnl -  B12 wnl -  TSH wnl -  Folate pending  Thrombocytopenia, progressive and decreased > 30% and closer to 50% from baseline.  Low suspicion for DIC/TTP -  D/c heparin -  HIT panel -  Smear and diff -  Repeat in AM  Severe protein calorie malnutrition.  Liberalize diet, supplements added.  Appreciate nutrition assistance. -  Calorie count -  Start Megace -  Continue goals of care conversation -  Information about dementia for family  Right eye conjunctivitis, start polytrim  Diet:  Dysphagia 3 Access:  PIV IVF:  yes Proph:  heparin  Code Status: FULL Family Communication:  patient alone Disposition Plan: back to [facility] pending improvement in platelet count, results of ECHO, conversation with family about GOC and what to expect with end-stage dementia.     Consultants:  None  Procedures:  CXR  ECHO  Antibiotics:  none   HPI/Subjective:  States she feels well.  Not eating per nursing staff  Objective: Filed Vitals:   03/31/13 0804 03/31/13 1339 03/31/13 2300 04/01/13 0500  BP: 156/60 154/67 139/70 129/75  Pulse: 62 63 65 63  Temp: 97.5 F (36.4 C) 97.7 F (36.5 C) 98.7 F (37.1 C) 98.1 F (36.7 C)  TempSrc: Oral Oral Oral Oral  Resp: 16 18 20 22   Height:      Weight:    40.37 kg (89 lb)  SpO2: 98% 98% 99% 99%    Intake/Output Summary (Last 24 hours) at 04/01/13 1147 Last data filed at 04/01/13 0900  Gross per 24 hour  Intake 2207.08 ml  Output      0 ml  Net 2207.08 ml   Filed Weights   03/31/13 0055 03/31/13 0500 04/01/13 0500  Weight: 40.234 kg (88 lb 11.2 oz) 40.869 kg (90 lb 1.6 oz) 40.37 kg (89 lb)    Exam:   General:  Thin BF, No acute distress  HEENT:  NCAT, MMM  Cardiovascular:  RRR, nl S1, S2 no mrg, 2+ pulses, warm extremities  Respiratory:  CTAB, no increased WOB  Abdomen:   NABS, soft, NT/ND  MSK:   Normal tone and bulk, no LEE  Neuro:  Grossly intact  Psych:  Pleasantly  confused  Data Reviewed: Basic Metabolic Panel:  Recent Labs Lab 03/30/13 2058 03/31/13 0220 04/01/13 0455  NA 144 145 142  K 3.9 4.1 3.6*  CL 104 107 102  CO2 21 22 25   GLUCOSE 149* 156* 70  BUN 24* 22 15  CREATININE 1.10 1.08 0.87  CALCIUM 9.7 9.3 9.9   Liver Function Tests:  Recent Labs Lab 03/30/13 2058 03/31/13 0220  AST 28 18  ALT 10 8  ALKPHOS 68 65  BILITOT <0.2* <0.2*  PROT 6.6 6.2  ALBUMIN 3.1* 2.8*   No results found for this basename: LIPASE, AMYLASE,  in the last 168 hours No results found for this basename: AMMONIA,  in the last 168 hours CBC:  Recent Labs Lab 03/30/13 2058 03/31/13 0220  04/01/13 0455  WBC 5.4 4.6 8.8  NEUTROABS  --   --  4.5  HGB 10.5* 9.8* 12.7  HCT 32.6* 29.9* 39.1  MCV 106.2* 105.3* 105.7*  PLT 109* 103* 82*   Cardiac Enzymes:  Recent Labs Lab 03/31/13 0220 03/31/13 0830 03/31/13 1335  TROPONINI <0.30 <0.30 <0.30   BNP (last 3 results) No results found for this basename: PROBNP,  in the last 8760 hours CBG: No results found for this basename: GLUCAP,  in the last 168 hours  No results found for this or any previous visit (from the past 240 hour(s)).   Studies: Dg Chest 2 View (if Patient Has Fever And/or Copd)  03/30/2013   CLINICAL DATA:  Respiratory distress  EXAM: CHEST  2 VIEW  COMPARISON:  01/02/2013  FINDINGS: Mild chronic pulmonary hyperinflation. No edema, consolidation, effusion, or pneumothorax. There is a 15 mm spiculated nodule in the right upper lung which has been present since at least 2010. No appreciable enlargement. Normal heart size. Chronic aortic tortuosity.  IMPRESSION: COPD without superimposed edema or pneumonia.   Electronically Signed   By: Jorje Guild M.D.   On: 03/30/2013 16:41    Scheduled Meds: . acetaminophen  650 mg Oral q morning - 10a  . amLODipine  5 mg Oral Daily  . aspirin EC  81 mg Oral Daily  . atorvastatin  10 mg Oral QHS  . divalproex  250 mg Oral QID  . feeding supplement (ENSURE COMPLETE)  237 mL Oral Q24H  . LORazepam  1 mg Intramuscular Once  . Memantine HCl ER  21 mg Oral Daily  . metoprolol succinate  25 mg Oral Daily  . mirtazapine  15 mg Oral QHS  . mirtazapine  7.5 mg Oral QHS  . OLANZapine zydis  5 mg Oral QHS  . pantoprazole  40 mg Oral BID AC  . predniSONE  40 mg Oral Q breakfast  . sertraline  50 mg Oral Daily  . sodium chloride  3 mL Intravenous Q12H   Continuous Infusions: . sodium chloride 75 mL/hr at 04/01/13 0809    Principal Problem:   Lactic acidosis Active Problems:   Dementia   HTN (hypertension)   COPD (chronic obstructive pulmonary disease)   Chest  pain    Time spent: 30 min    Annette Cantrell, [facility]  [facility] Pager [phone]. If 7PM-7AM, please contact night-coverage at [email or web address], password [facility] 04/01/2013, 11:47 AM  LOS: 2 days

## 2013-04-01 NOTE — Progress Notes (Signed)
Clinical Social Work Department BRIEF PSYCHOSOCIAL ASSESSMENT 04/01/2013  Patient:  Annette Cantrell, Annette Cantrell     Account Number:  [id number]     Admit date:  03/30/2013  Clinical Social Worker:  Levie Heritage  Date/Time:  04/01/2013 03:45 PM  Referred by:  Physician  Date Referred:  04/01/2013 Referred for  ALF Placement   Other Referral:   Interview type:  Other - See comment Other interview type:   Pt's granddaughter, [name], via phone    PSYCHOSOCIAL DATA Living Status:  FACILITY Admitted from facility:  [place] [facility] Level of care:  Assisted Living Primary support name:  Stefano Gaul Primary support relationship to patient:  FAMILY Degree of support available:   strong    CURRENT CONCERNS Current Concerns  Post-Acute Placement   Other Concerns:    SOCIAL WORK ASSESSMENT / PLAN Spoke with Pt's granddaughter via phone.    [name] stated that her grandmother lives at [facility].  She would like for Pt to return to [facility] upon d/c.    CSW thanked Mrs. Johnson for her time.   Assessment/plan status:  Psychosocial Support/Ongoing Assessment of Needs Other assessment/ plan:   Information/referral to community resources:   n/a--from facility    PATIENT'S/FAMILY'S RESPONSE TO PLAN OF CARE: Pt's granddaughter was calm, cooperative and pleasant.    [name] stated that she is coping well with her grandmother's medical situation and that the family is happy for Pt to return to [facility] upon d/c.    [name] thanked CSW for time and assistance.   Bernita Raisin, [place] Work [phone]

## 2013-04-01 NOTE — Progress Notes (Signed)
2D Echocardiogram has been performed.  Doyle Askew 04/01/2013, 9:46 AM

## 2013-04-02 DIAGNOSIS — D696 Thrombocytopenia, unspecified: Secondary | ICD-10-CM

## 2013-04-02 DIAGNOSIS — E43 Unspecified severe protein-calorie malnutrition: Secondary | ICD-10-CM

## 2013-04-02 LAB — BASIC METABOLIC PANEL
BUN: 30 mg/dL — ABNORMAL HIGH (ref 6–23)
CO2: 26 mEq/L (ref 19–32)
Calcium: 9.3 mg/dL (ref 8.4–10.5)
Chloride: 106 mEq/L (ref 96–112)
Creatinine, Ser: 1.92 mg/dL — ABNORMAL HIGH (ref 0.50–1.10)
GFR calc non Af Amer: 22 mL/min — ABNORMAL LOW (ref >90)
GFR, EST AFRICAN AMERICAN: 26 mL/min — AB (ref >90)
Glucose, Bld: 99 mg/dL (ref 70–99)
POTASSIUM: 4.3 meq/L (ref 3.7–5.3)
SODIUM: 141 meq/L (ref 137–147)

## 2013-04-02 LAB — CBC
HCT: 31.2 % — ABNORMAL LOW (ref 36.0–46.0)
Hemoglobin: 10.3 g/dL — ABNORMAL LOW (ref 12.0–15.0)
MCH: 34.4 pg — ABNORMAL HIGH (ref 26.0–34.0)
MCHC: 33 g/dL (ref 30.0–36.0)
MCV: 104.3 fL — ABNORMAL HIGH (ref 78.0–100.0)
Platelets: 75 10*3/uL — ABNORMAL LOW (ref 150–400)
RBC: 2.99 MIL/uL — AB (ref 3.87–5.11)
RDW: 13.9 % (ref 11.5–15.5)
WBC: 5.3 10*3/uL (ref 4.0–10.5)

## 2013-04-02 LAB — CREATININE, URINE, RANDOM: CREATININE, URINE: 204.3 mg/dL

## 2013-04-02 LAB — URINE MICROSCOPIC-ADD ON

## 2013-04-02 LAB — URINALYSIS, ROUTINE W REFLEX MICROSCOPIC
GLUCOSE, UA: NEGATIVE mg/dL
HGB URINE DIPSTICK: NEGATIVE
Ketones, ur: NEGATIVE mg/dL
Nitrite: NEGATIVE
Protein, ur: NEGATIVE mg/dL
SPECIFIC GRAVITY, URINE: 1.018 (ref 1.005–1.030)
UROBILINOGEN UA: 0.2 mg/dL (ref 0.0–1.0)
pH: 5 (ref 5.0–8.0)

## 2013-04-02 LAB — SODIUM, URINE, RANDOM: SODIUM UR: 25 meq/L

## 2013-04-02 LAB — FOLATE RBC: RBC Folate: 852 ng/mL — ABNORMAL HIGH (ref >280)

## 2013-04-02 MED ORDER — SODIUM CHLORIDE 0.9 % IV SOLN
INTRAVENOUS | Status: DC
  Administered 2013-04-02: 20:00:00 via INTRAVENOUS
  Administered 2013-04-02: 75 mL/h via INTRAVENOUS
  Administered 2013-04-03 – 2013-04-04 (×3): via INTRAVENOUS

## 2013-04-02 NOTE — Progress Notes (Signed)
Calorie Count Note  48 hour calorie count ordered.  Diet: Dysphagia 3 thin Supplements:  Ensure Complete po once daily, each supplement provides 350 kcal and 13 grams of protein   Estimated Nutritional Needs:  1250-1450 kcal, 40-50 gm protein daily.  No results at this time.  Nutrition Dx: Increased nutrient needs (protein/kcal) related to increased demand for nutrient as evidenced by BMI <18.5.    Goal: Pt to meet >/= 90% of their estimated nutrition needs    Intervention: Begin calorie count, Tech to place envelope on the door.  RD to follow up with results.  Antonieta Iba, RD, LDN Clinical Inpatient Dietitian Pager:  [phone] Weekend and after hours pager:  [phone]

## 2013-04-02 NOTE — Progress Notes (Signed)
[facility] PROGRESS NOTE  Annette Cantrell VQM:[id number] DOB: 06/12/1922 DOA: 03/30/2013 PCP: Clent Demark, MD  Assessment/Plan  Dementia, progressive and leading to dehydration and weight loss.  7kg weight loss in last year. -  Continue memantine, Remeron, Zoloft, depakote, prn xanax  -  Provided information to family about dementia and encouraged consideration of hospice.  -  Palliative care consultation  AKI, likely due to dehydration -  Restart IVF -  FENa -  UA -  Consider [place] if not improving with hydration  Lactic acidosis, most likely secondary to dehydration. She denied abdominal pain he did not have tenderness on exam. Her EKG was stable and troponins negative. Chest x-ray was also unremarkable. UA negative. She is not on metformin. Her lactic acidosis resolved quickly with IV fluids.  Atypical chest pain, with ST segment depression in anterior leads. Telemetry demonstrated normal sinus rhythm (with RBBB). Troponins were negative.  She did not have tachycardia or hypoxia to suggest pulmonary embolism, and CXR was negative for infiltrate and pneumothorax.   -  Continue prn maalox -  ECHO:  New severe hypokinesis of the inferior wall and inferolateral wall.  -  Troponin neg -  Telemetry:  NSR, bradycardia  CAD s/p MI.  I think this patient has less than 6 months to live and therefore would not be well served by cardiac catheterization.  Recommend medical management of CAD. -  Continue ASA, statin, BB -  Consider addition of nitrate if CP recurs  ICM, EF 45%.  Medical management -  No diuretics such as lasix or spironolactone due to high risk of dehydration  COPD without SOB but no wheezing.   -  Maricus Tanzi steroid taper -  Schedule duonebs  Macrocytic anemia, worsened anemia since earlier this year -  Iron studies wnl -  B12 wnl -  TSH wnl -  Folate pending  Thrombocytopenia, progressive and decreased again today, but appears to be nadiring.  Low suspicion for  DIC/TTP -  HIT panel pending -  Smear and diff -  Repeat in AM  Severe protein calorie malnutrition.  Liberalize diet, supplements added.  Appreciate nutrition assistance. -  Calorie count -  Started Megace 3/28 -  Continue goals of care conversation -  Information about dementia for family  Right eye conjunctivitis, start polytrim  Diet:  Dysphagia 3 Access:  PIV IVF:  yes Proph:  heparin  Code Status: FULL Family Communication: patient alone Disposition Plan: back to [facility] pending improvement in platelet count, results of ECHO, conversation with family about GOC and what to expect with end-stage dementia.     Consultants:  None  Procedures:  CXR  ECHO  Antibiotics:  none   HPI/Subjective:  States she feels well.  Not eating per nursing staff.  7 voids recorded so still making urine  Objective: Filed Vitals:   04/01/13 0500 04/01/13 1520 04/01/13 2241 04/02/13 0539  BP: 129/75 138/68 122/55 115/76  Pulse: 63 57 57 64  Temp: 98.1 F (36.7 C) 97.9 F (36.6 C) 97.4 F (36.3 C) 98.2 F (36.8 C)  TempSrc: Oral Oral Oral Oral  Resp: 22 18 20 20   Height:      Weight: 40.37 kg (89 lb)   38.057 kg (83 lb 14.4 oz)  SpO2: 99% 98% 99% 96%    Intake/Output Summary (Last 24 hours) at 04/02/13 1313 Last data filed at 04/01/13 1521  Gross per 24 hour  Intake    120 ml  Output  0 ml  Net    120 ml   Filed Weights   03/31/13 0500 04/01/13 0500 04/02/13 0539  Weight: 40.869 kg (90 lb 1.6 oz) 40.37 kg (89 lb) 38.057 kg (83 lb 14.4 oz)    Exam:   General:  Thin BF, No acute distress  HEENT:  NCAT, MMM  Cardiovascular:  RRR, nl S1, S2 no mrg, 2+ pulses, warm extremities  Respiratory:  CTAB, no increased WOB  Abdomen:   NABS, soft, NT/ND  MSK:   Normal tone and bulk, no LEE  Neuro:  Grossly intact  Psych:  Pleasantly confused  Data Reviewed: Basic Metabolic Panel:  Recent Labs Lab 03/30/13 2058 03/31/13 0220 04/01/13 0455 04/02/13 0420   NA 144 145 142 141  K 3.9 4.1 3.6* 4.3  CL 104 107 102 106  CO2 21 22 25 26   GLUCOSE 149* 156* 70 99  BUN 24* 22 15 30*  CREATININE 1.10 1.08 0.87 1.92*  CALCIUM 9.7 9.3 9.9 9.3   Liver Function Tests:  Recent Labs Lab 03/30/13 2058 03/31/13 0220  AST 28 18  ALT 10 8  ALKPHOS 68 65  BILITOT <0.2* <0.2*  PROT 6.6 6.2  ALBUMIN 3.1* 2.8*   No results found for this basename: LIPASE, AMYLASE,  in the last 168 hours No results found for this basename: AMMONIA,  in the last 168 hours CBC:  Recent Labs Lab 03/30/13 2058 03/31/13 0220 04/01/13 0455 04/02/13 0420  WBC 5.4 4.6 8.8 5.3  NEUTROABS  --   --  4.5  --   HGB 10.5* 9.8* 12.7 10.3*  HCT 32.6* 29.9* 39.1 31.2*  MCV 106.2* 105.3* 105.7* 104.3*  PLT 109* 103* 82* 75*   Cardiac Enzymes:  Recent Labs Lab 03/31/13 0220 03/31/13 0830 03/31/13 1335  TROPONINI <0.30 <0.30 <0.30   BNP (last 3 results) No results found for this basename: PROBNP,  in the last 8760 hours CBG: No results found for this basename: GLUCAP,  in the last 168 hours  Recent Results (from the past 240 hour(s))  CULTURE, BLOOD (ROUTINE X 2)     Status: None   Collection Time    03/30/13  9:30 PM      Result Value Ref Range Status   Specimen Description BLOOD RIGHT ARM   Final   Special Requests BOTTLES DRAWN AEROBIC AND ANAEROBIC 5CC   Final   Culture  Setup Time     Final   Value: 03/31/2013 00:56     Performed at [facility]   Culture     Final   Value:        BLOOD CULTURE RECEIVED NO GROWTH TO DATE CULTURE WILL BE HELD FOR 5 DAYS BEFORE ISSUING A FINAL NEGATIVE REPORT     Performed at [facility]   Report Status PENDING   Incomplete     Studies: No results found.  Scheduled Meds: . acetaminophen  650 mg Oral q morning - 10a  . amLODipine  5 mg Oral Daily  . aspirin EC  81 mg Oral Daily  . atorvastatin  10 mg Oral QHS  . divalproex  250 mg Oral QID  . feeding supplement (ENSURE COMPLETE)  237 mL Oral Q24H   . LORazepam  1 mg Intramuscular Once  . megestrol  400 mg Oral Daily  . Memantine HCl ER  21 mg Oral Daily  . metoprolol succinate  25 mg Oral Daily  . mirtazapine  7.5 mg Oral QHS  . OLANZapine  zydis  5 mg Oral QHS  . pantoprazole  40 mg Oral BID AC  . predniSONE  40 mg Oral Q breakfast  . sertraline  50 mg Oral Daily  . sodium chloride  3 mL Intravenous Q12H  . trimethoprim-polymyxin b  1 drop Right Eye 4 times per day   Continuous Infusions: . sodium chloride 75 mL/hr (04/02/13 1147)    Principal Problem:   Lactic acidosis Active Problems:   Dementia   HTN (hypertension)   COPD (chronic obstructive pulmonary disease)   Chest pain   Thrombocytopenia, unspecified   Protein-calorie malnutrition, severe    Time spent: 30 min    Zari Cly, [place] [facility] Pager [phone]. If 7PM-7AM, please contact night-coverage at [email or web address], password [facility] 04/02/2013, 1:13 PM  LOS: 3 days

## 2013-04-03 ENCOUNTER — Inpatient Hospital Stay (HOSPITAL_COMMUNITY): Payer: Medicare Other

## 2013-04-03 DIAGNOSIS — E440 Moderate protein-calorie malnutrition: Secondary | ICD-10-CM | POA: Insufficient documentation

## 2013-04-03 LAB — CBC
HEMATOCRIT: 26.6 % — AB (ref 36.0–46.0)
Hemoglobin: 8.6 g/dL — ABNORMAL LOW (ref 12.0–15.0)
MCH: 34.3 pg — AB (ref 26.0–34.0)
MCHC: 32.3 g/dL (ref 30.0–36.0)
MCV: 106 fL — AB (ref 78.0–100.0)
Platelets: 75 10*3/uL — ABNORMAL LOW (ref 150–400)
RBC: 2.51 MIL/uL — ABNORMAL LOW (ref 3.87–5.11)
RDW: 13.9 % (ref 11.5–15.5)
WBC: 5.7 10*3/uL (ref 4.0–10.5)

## 2013-04-03 LAB — BASIC METABOLIC PANEL
BUN: 44 mg/dL — AB (ref 6–23)
CO2: 22 mEq/L (ref 19–32)
CREATININE: 2.38 mg/dL — AB (ref 0.50–1.10)
Calcium: 9.1 mg/dL (ref 8.4–10.5)
Chloride: 106 mEq/L (ref 96–112)
GFR calc Af Amer: 20 mL/min — ABNORMAL LOW (ref >90)
GFR calc non Af Amer: 17 mL/min — ABNORMAL LOW (ref >90)
Glucose, Bld: 92 mg/dL (ref 70–99)
Potassium: 4.2 mEq/L (ref 3.7–5.3)
Sodium: 142 mEq/L (ref 137–147)

## 2013-04-03 MED ORDER — ENSURE COMPLETE PO LIQD
237.0000 mL | Freq: Two times a day (BID) | ORAL | Status: DC
  Administered 2013-04-03 – 2013-04-05 (×4): 237 mL via ORAL

## 2013-04-03 MED ORDER — PREDNISONE 20 MG PO TABS
20.0000 mg | ORAL_TABLET | Freq: Every day | ORAL | Status: DC
  Administered 2013-04-04: 20 mg via ORAL
  Filled 2013-04-03 (×2): qty 1

## 2013-04-03 MED ORDER — POLYMYXIN B-TRIMETHOPRIM 10000-0.1 UNIT/ML-% OP SOLN
1.0000 [drp] | Freq: Four times a day (QID) | OPHTHALMIC | Status: DC
  Administered 2013-04-03 – 2013-04-05 (×8): 1 [drp] via OPHTHALMIC

## 2013-04-03 NOTE — Progress Notes (Signed)
Annette Cantrell from [facility] was called and updated on pt's condition.

## 2013-04-03 NOTE — Progress Notes (Signed)
Calorie Count Note  Pt meets criteria for moderate MALNUTRITION in the context of chronic illness as evidenced by PO intake likely less <75% for> one month d/t family report of 7 kg wt loss in one year, moderate loss of subcutaneous fat loss and muscle wasting   48 hour calorie count ordered.  Diet: Dys3 Supplements: Ensure Complete once daily  Breakfast: 39 kcal, 1.5 gram protein Lunch: 389 kcal, 11 gram protein Dinner: 233 kcal, 32 gram protein Supplements: 0%, 140 kcal, 3 gram protein (PM snack)  Total intake: 801 kcal (64% of minimum estimated needs)  47.5 gram protein (100% of minimum estimated needs)  Estimated Nutritional Needs: 1250-1450 kcal, 40-50 gm protein daily.   Nutrition Dx: Nutrition Dx: Increased nutrient needs (protein/kcal) related to increased demand for nutrient as evidenced by BMI <18.5.    Goal: Pt to meet >/= 90% of their estimated nutrition needs  -not met  Intervention: -Increase Ensure Complete to BID -Continue with Megace appetite stimulant -Encourage PO intake  -Will complete calorie count on 3/31   Atlee Abide Ogilvie RD LDN Clinical Dietitian [phone]

## 2013-04-03 NOTE — Progress Notes (Addendum)
[facility] PROGRESS NOTE  Annette Cantrell [id number]:[id number] DOB: [birth year] DOA: 03/30/2013 PCP: Clent Demark, MD  Assessment/Plan  Dementia, progressive and leading to dehydration and weight loss.  7kg weight loss in last year. -  Continue memantine, Remeron, Zoloft, depakote, prn xanax  -  Provided information to family about dementia and encouraged consideration of hospice.  -  Palliative care consultation  AKI, likely due to dehydration and worsened despite IVF yesterday -  FENa << 1% -  UA:  No UTI, mildly concentrated -  RUS:  Medical renal disease, no hydro -  Increase IVF temporarily this morning, then resume 52ml/h to increase total daily fluids by approximately 541ml -  Monitor for tachypnea/hypoxia -  Repeat in AM  Lactic acidosis, most likely secondary to dehydration. She denied abdominal pain he did not have tenderness on exam. Her EKG was stable and troponins negative. Chest x-ray was also unremarkable. UA negative. She is not on metformin. Her lactic acidosis resolved quickly with IV fluids.  Atypical chest pain, with ST segment depression in anterior leads. Telemetry demonstrated normal sinus rhythm (with RBBB). Troponins were negative.  She did not have tachycardia or hypoxia to suggest pulmonary embolism, and CXR was negative for infiltrate and pneumothorax.   -  Continue prn maalox -  ECHO:  New severe hypokinesis of the inferior wall and inferolateral wall.  -  Troponin neg -  Telemetry:  NSR, bradycardia  CAD s/p MI.  I think this patient has less than 6 months to live and therefore would not be well served by cardiac catheterization.  Recommend medical management of CAD. -  Continue ASA, statin, BB -  Consider addition of nitrate if CP recurs  ICM, EF 45%.  Medical management -  No diuretics such as lasix or spironolactone due to high risk of dehydration, currently prerenal  COPD without SOB but no wheezing.   -  Wean prednisone to 20mg  tomorrow, then  10mg , then off -  Schedule duonebs  Macrocytic anemia, worsened anemia since earlier this year, likely secondary to CKD -  Iron studies wnl -  B12 wnl -  TSH wnl -  Folate wnl  Thrombocytopenia, appears to be nadiring.  Low suspicion for DIC/TTP -  HIT panel pending -  Smear and diff:  No schistocytes  Severe protein calorie malnutrition.  Liberalize diet, supplements added.  Appreciate nutrition assistance. -  Calorie count -  Started Megace 3/28 -  Continue goals of care conversation -  Information about dementia for family  Right eye conjunctivitis, start polytrim  Diet:  Dysphagia 3 Access:  PIV IVF:  yes Proph:  heparin  Code Status: FULL Family Communication: patient alone, have tried calling granddaughter last two days and was unable to reach her Disposition Plan: back to [facility] pending improvement in platelet count, results of ECHO, conversation with family about GOC and what to expect with end-stage dementia.     Consultants:  None  Procedures:  CXR  ECHO  Antibiotics:  none   HPI/Subjective:  States she feels well.  Not eating per nursing staff.  7 voids recorded so still making urine  Objective: Filed Vitals:   04/02/13 1655 04/02/13 2015 04/03/13 0004 04/03/13 0622  BP: 91/40 88/51 129/51 134/60  Pulse:  65  56  Temp:  98.3 F (36.8 C)  97.8 F (36.6 C)  TempSrc:  Oral  Oral  Resp:  20  20  Height:      Weight:    38.828 kg (85  lb 9.6 oz)  SpO2:  96%  99%    Intake/Output Summary (Last 24 hours) at 04/03/13 0852 Last data filed at 04/03/13 0700  Gross per 24 hour  Intake 1627.5 ml  Output     70 ml  Net 1557.5 ml   Filed Weights   04/01/13 0500 04/02/13 0539 04/03/13 0622  Weight: 40.37 kg (89 lb) 38.057 kg (83 lb 14.4 oz) 38.828 kg (85 lb 9.6 oz)    Exam:   General:  Thin BF, No acute distress  HEENT:  NCAT, MMM, bilateral eyes mildly injected with purulent discharge  Cardiovascular:  RRR, nl S1, S2 no mrg, 2+ pulses,  warm extremities  Respiratory:  CTAB, no increased WOB  Abdomen:   NABS, soft, NT/ND  MSK:   Normal tone and bulk, no LEE  Neuro:  Grossly intact  Psych:  Pleasantly confused  Data Reviewed: Basic Metabolic Panel:  Recent Labs Lab 03/30/13 2058 03/31/13 0220 04/01/13 0455 04/02/13 0420 04/03/13 0335  NA 144 145 142 141 142  K 3.9 4.1 3.6* 4.3 4.2  CL 104 107 102 106 106  CO2 21 22 25 26 22   GLUCOSE 149* 156* 70 99 92  BUN 24* 22 15 30* 44*  CREATININE 1.10 1.08 0.87 1.92* 2.38*  CALCIUM 9.7 9.3 9.9 9.3 9.1   Liver Function Tests:  Recent Labs Lab 03/30/13 2058 03/31/13 0220  AST 28 18  ALT 10 8  ALKPHOS 68 65  BILITOT <0.2* <0.2*  PROT 6.6 6.2  ALBUMIN 3.1* 2.8*   No results found for this basename: LIPASE, AMYLASE,  in the last 168 hours No results found for this basename: AMMONIA,  in the last 168 hours CBC:  Recent Labs Lab 03/30/13 2058 03/31/13 0220 04/01/13 0455 04/02/13 0420 04/03/13 0335  WBC 5.4 4.6 8.8 5.3 5.7  NEUTROABS  --   --  4.5  --   --   HGB 10.5* 9.8* 12.7 10.3* 8.6*  HCT 32.6* 29.9* 39.1 31.2* 26.6*  MCV 106.2* 105.3* 105.7* 104.3* 106.0*  PLT 109* 103* 82* 75* 75*   Cardiac Enzymes:  Recent Labs Lab 03/31/13 0220 03/31/13 0830 03/31/13 1335  TROPONINI <0.30 <0.30 <0.30   BNP (last 3 results) No results found for this basename: PROBNP,  in the last 8760 hours CBG: No results found for this basename: GLUCAP,  in the last 168 hours  Recent Results (from the past 240 hour(s))  CULTURE, BLOOD (ROUTINE X 2)     Status: None   Collection Time    03/30/13  9:30 PM      Result Value Ref Range Status   Specimen Description BLOOD RIGHT ARM   Final   Special Requests BOTTLES DRAWN AEROBIC AND ANAEROBIC 5CC   Final   Culture  Setup Time     Final   Value: 03/31/2013 00:56     Performed at [facility]   Culture     Final   Value:        BLOOD CULTURE RECEIVED NO GROWTH TO DATE CULTURE WILL BE HELD FOR 5 DAYS BEFORE  ISSUING A FINAL NEGATIVE REPORT     Performed at [facility]   Report Status PENDING   Incomplete     Studies: No results found.  Scheduled Meds: . acetaminophen  650 mg Oral q morning - 10a  . amLODipine  5 mg Oral Daily  . aspirin EC  81 mg Oral Daily  . atorvastatin  10 mg Oral QHS  .  divalproex  250 mg Oral QID  . feeding supplement (ENSURE COMPLETE)  237 mL Oral Q24H  . LORazepam  1 mg Intramuscular Once  . megestrol  400 mg Oral Daily  . Memantine HCl ER  21 mg Oral Daily  . metoprolol succinate  25 mg Oral Daily  . mirtazapine  7.5 mg Oral QHS  . OLANZapine zydis  5 mg Oral QHS  . pantoprazole  40 mg Oral BID AC  . predniSONE  40 mg Oral Q breakfast  . sertraline  50 mg Oral Daily  . sodium chloride  3 mL Intravenous Q12H  . trimethoprim-polymyxin b  1 drop Right Eye 4 times per day   Continuous Infusions: . sodium chloride 150 mL/hr at 04/03/13 0740    Principal Problem:   Lactic acidosis Active Problems:   Dementia   HTN (hypertension)   COPD (chronic obstructive pulmonary disease)   Chest pain   Thrombocytopenia, unspecified   Protein-calorie malnutrition, severe    Time spent: 30 min    Annette Cantrell, [place] [facility] Pager [phone]. If 7PM-7AM, please contact night-coverage at [email or web address], password [facility] 04/03/2013, 8:52 AM  LOS: 4 days

## 2013-04-04 DIAGNOSIS — R5383 Other fatigue: Secondary | ICD-10-CM

## 2013-04-04 DIAGNOSIS — Z515 Encounter for palliative care: Secondary | ICD-10-CM

## 2013-04-04 DIAGNOSIS — R1310 Dysphagia, unspecified: Secondary | ICD-10-CM

## 2013-04-04 DIAGNOSIS — R531 Weakness: Secondary | ICD-10-CM

## 2013-04-04 DIAGNOSIS — R5381 Other malaise: Secondary | ICD-10-CM

## 2013-04-04 LAB — CBC
HCT: 27.3 % — ABNORMAL LOW (ref 36.0–46.0)
HEMOGLOBIN: 8.6 g/dL — AB (ref 12.0–15.0)
MCH: 33.3 pg (ref 26.0–34.0)
MCHC: 31.5 g/dL (ref 30.0–36.0)
MCV: 105.8 fL — ABNORMAL HIGH (ref 78.0–100.0)
PLATELETS: 80 10*3/uL — AB (ref 150–400)
RBC: 2.58 MIL/uL — AB (ref 3.87–5.11)
RDW: 14 % (ref 11.5–15.5)
WBC: 6.1 10*3/uL (ref 4.0–10.5)

## 2013-04-04 LAB — BASIC METABOLIC PANEL
BUN: 36 mg/dL — ABNORMAL HIGH (ref 6–23)
CALCIUM: 8.9 mg/dL (ref 8.4–10.5)
CO2: 22 mEq/L (ref 19–32)
Chloride: 115 mEq/L — ABNORMAL HIGH (ref 96–112)
Creatinine, Ser: 1.65 mg/dL — ABNORMAL HIGH (ref 0.50–1.10)
GFR calc Af Amer: 31 mL/min — ABNORMAL LOW (ref >90)
GFR calc non Af Amer: 27 mL/min — ABNORMAL LOW (ref >90)
GLUCOSE: 88 mg/dL (ref 70–99)
POTASSIUM: 4.2 meq/L (ref 3.7–5.3)
SODIUM: 146 meq/L (ref 137–147)

## 2013-04-04 MED ORDER — BISACODYL 5 MG PO TBEC: 5.0000 mg | DELAYED_RELEASE_TABLET | Freq: Every day | ORAL | Status: DC | PRN

## 2013-04-04 MED ORDER — MORPHINE SULFATE (CONCENTRATE) 10 MG /0.5 ML PO SOLN: 5.0000 mg | ORAL | Status: DC | PRN

## 2013-04-04 MED ORDER — PREDNISONE 10 MG PO TABS
10.0000 mg | ORAL_TABLET | Freq: Every day | ORAL | Status: AC
  Administered 2013-04-05: 10 mg via ORAL
  Filled 2013-04-04: qty 1

## 2013-04-04 NOTE — Progress Notes (Signed)
Nutrition Brief Note  -Calorie Count d/c'd -Continue with Ensure supplements BID -Continue with liberalized diet -Per discussion with RN, pt will likely be d/c to ALF with palliative care. Family meeting coordination in process  Will continue to monitor per protocols. Please re-consult as needed  [place] Clinical Dietitian MZTAE:[phone]

## 2013-04-04 NOTE — Progress Notes (Signed)
Palliative Medicine consult received-we are in the process of coordinating a family meeting. Awaiting call back from family to confirm time.  Lane Hacker, DO Palliative Medicine

## 2013-04-04 NOTE — Progress Notes (Signed)
Notified by Juliann Pulse [facility] of request for Hospice and [facility]) to follow after discharge; per notes and discussion with CMRN pt to d/c back to [facility] tomorrow Wednesday 04/05/13 by non-emergent transport; Discussed with CMRN Juliann Pulse who was in contact with ALF contact and request was made for hospital bed -per [facility] DME list Package D: which includes an AP&P mattress pad, over-bed table and fully electric hospital bed. [facility] Representative Pura Spice aware of DME request -  *please note Facesheet address is not address of current residence -[facility] [address], [place] [address] - phone [phone] [facility] aware of request and awaiting order from facility physician. [facility] will follow up upon discharge. Please send GOLD DNR form back to ALF with discharge paperwork.  Please contact [facility]([phone]) with any questions. Danton Sewer, RN [facility]

## 2013-04-04 NOTE — Consult Note (Signed)
Patient RC:[id number] Annette Cantrell      DOB: 07-26-1922      [id number]:[id number]     Consult Note from the Palliative Medicine Team at [facility] Requested by: Dr Sheran Fava      PCP: Clent Demark, MD Reason for Consultation:Clarification of [place] and options      Phone Number:[phone]  Assessment of patients Current state: Annette Cantrell is a 80 y.o. female with Past medical history of severe dementia, CVA, dyslipidemia, COPD.  Daughter reports continued physical, functional and cognitive decline over the past 6 months.  Poor po intake, albumin 2.2.    Focus of care is now full comfort, she will benefit from hospice services at there AL   This NP Annette Cantrell reviewed medical records, received report from team, assessed the patient and then spoke by telephone at the bedside  with her daughter and main decision maker, Annette Cantrell (who also lives at [facility])   to discuss diagnosis prognosis, [place], EOL wishes disposition and options.  I then reviewed the conversation and plan with Annette Cantrell (RN at [facility])  A detailed discussion was had today regarding advanced directives.  Concepts specific to code status, artifical feeding and hydration, continued IV antibiotics and rehospitalization was had.  The difference between a aggressive medical intervention path  and a palliative comfort care path for this patient at this time was had.  Values and goals of care important to patient and family were attempted to be elicited.  Concept of Hospice and Palliative Care were discussed  Natural trajectory and expectations at EOL were discussed.  Questions and concerns addressed. Family encouraged to call with questions or concerns.  PMT will continue to support holistically.   Goals of Care: 1.  Code Status:DNR/DNI-comfort is main focus of care   2. Scope of Treatment: 1. Vital Signs: daily  2. Respiratory/Oxygen:for comfort only 3. Nutritional Support/Tube Feeds:no artificial  feeding now or in the future 4. Antibiotics: oral only 5. Blood Products:none 6. EUM:PNTI 7. Review of Medications to be discontinued:minimize for comfort 8. Labs:none 9. Telemetry:none   4. Disposition:  Back to [facility] with hospice services in place.  Daughter and facility requested [facility] will relay this info to case manager.   3. Symptom Management:   1. Anxiety/Agitation: Continue Xanax,Depakote, Memantine, Zyprexa as previously written 2. Pain/Dyspnea: Roxanol 5 mg po/sl every 3 hrs prn 3. Bowel Regimen: Dulcolax supp prn 4. Dysphagia: diet as tolerated with known risk of aspiration  4. Psychosocial:  Emotional support to daughter, she verbalizes an understanding of her moth's limited prognosis and hopes for comfort at this time.    Brief [id number] Annette Cantrell is a 80 y.o. female with Past medical history of severe dementia, CVA, dyslipidemia, COPD.  The patient is resident of   [facility].  Patient was brought in by the nursing home personnel as she was complaining of some chest tightness On further workup the patient was found to be having elevated lactic acidosis and hospitalist was asked to admit the patient   And at her baseline dependent for most of her ADL.  Daughter reports continued physical, functional and cognitive decline over the past 6 months.    ROS: denies complaints at this time, limited cognition    PMH:  Past Medical History  Diagnosis Date  . Dementia   . Stroke   . High cholesterol   . Heart disease, unspecified   . LUNG NODULE 03/30/2008    Qualifier: Diagnosis of  ByAnnamaria Boots MD, [name]   . COPD 03/30/2008    Qualifier: Diagnosis of  By: Annette Boots MD, [name]   . Infected sebaceous cyst 01/25/2012  . Herpes zoster 01/30/2012  . History of positive PPD, untreated   . CAD (coronary artery disease)   . Chronic systolic heart failure      PSH: Past Surgical History  Procedure Laterality Date  . Hip arthroplasty  01/31/2012     Procedure: ARTHROPLASTY BIPOLAR HIP;  Surgeon: Rozanna Box, MD;  Location: [facility];  Service: Orthopedics;  Laterality: Right;   I have reviewed the [place] and SH and  If appropriate update it with new information. No Known Allergies Scheduled Meds: . acetaminophen  650 mg Oral q morning - 10a  . amLODipine  5 mg Oral Daily  . aspirin EC  81 mg Oral Daily  . divalproex  250 mg Oral QID  . feeding supplement (ENSURE COMPLETE)  237 mL Oral BID BM  . Memantine HCl ER  21 mg Oral Daily  . metoprolol succinate  25 mg Oral Daily  . mirtazapine  7.5 mg Oral QHS  . OLANZapine zydis  5 mg Oral QHS  . pantoprazole  40 mg Oral BID AC  . predniSONE  20 mg Oral Q breakfast  . sertraline  50 mg Oral Daily  . sodium chloride  3 mL Intravenous Q12H  . trimethoprim-polymyxin b  1 drop Both Eyes 4 times per day   Continuous Infusions: . sodium chloride 75 mL/hr at 04/04/13 0235   PRN Meds:.acetaminophen, acetaminophen, albuterol, ALPRAZolam, gi cocktail, ondansetron (ZOFRAN) IV, ondansetron    BP 149/60  Pulse 56  Temp(Src) 98.5 F (36.9 C) (Oral)  Resp 18  Ht 5\' 3"  (1.6 m)  Wt 41.867 kg (92 lb 4.8 oz)  BMI 16.35 kg/m2  SpO2 96%   PPS: 30 % at best   Intake/Output Summary (Last 24 hours) at 04/04/13 1019 Last data filed at 04/04/13 0900  Gross per 24 hour  Intake 2326.25 ml  Output    150 ml  Net 2176.25 ml    Physical Exam:  General: frail, thin female, NAD HEENT:  Mm, no exudate, + temporal muscle wasting Chest:   Decreased in bases CTA CVS: RRR Abdomen: soft NT +BS Ext: without edema Neuro:awake, follows simple commands,   Labs: CBC    Component Value Date/Time   WBC 6.1 04/04/2013 0438   RBC 2.58* 04/04/2013 0438   HGB 8.6* 04/04/2013 0438   HCT 27.3* 04/04/2013 0438   PLT 80* 04/04/2013 0438   MCV 105.8* 04/04/2013 0438   MCH 33.3 04/04/2013 0438   MCHC 31.5 04/04/2013 0438   RDW 14.0 04/04/2013 0438   LYMPHSABS 2.8 04/01/2013 0455   MONOABS 1.1* 04/01/2013 0455    EOSABS 0.4 04/01/2013 0455   BASOSABS 0.0 04/01/2013 0455    BMET    Component Value Date/Time   NA 146 04/04/2013 0438   K 4.2 04/04/2013 0438   CL 115* 04/04/2013 0438   CO2 22 04/04/2013 0438   GLUCOSE 88 04/04/2013 0438   BUN 36* 04/04/2013 0438   CREATININE 1.65* 04/04/2013 0438   CALCIUM 8.9 04/04/2013 0438   GFRNONAA 27* 04/04/2013 0438   GFRAA 31* 04/04/2013 0438    CMP     Component Value Date/Time   NA 146 04/04/2013 0438   K 4.2 04/04/2013 0438   CL 115* 04/04/2013 0438   CO2 22 04/04/2013 0438   GLUCOSE 88 04/04/2013 0438  BUN 36* 04/04/2013 0438   CREATININE 1.65* 04/04/2013 0438   CALCIUM 8.9 04/04/2013 0438   PROT 6.2 03/31/2013 0220   ALBUMIN 2.8* 03/31/2013 0220   AST 18 03/31/2013 0220   ALT 8 03/31/2013 0220   ALKPHOS 65 03/31/2013 0220   BILITOT <0.2* 03/31/2013 0220   GFRNONAA 27* 04/04/2013 0438   GFRAA 31* 04/04/2013 0438     Time In Time Out Total Time Spent with Patient Total Overall Time  0900 1000 60 min 60 min    Greater than 50%  of this time was spent counseling and coordinating care related to the above assessment and plan.   Annette Lessen NP  Palliative Medicine Team Team Phone # [phone] Pager [phone]  Discussed with Dr Sheran Fava and Juliann Pulse [facility]

## 2013-04-04 NOTE — Progress Notes (Signed)
[facility]   [facility] is providing the following services: hospice pkg d  If patient discharges after hours, please call [phone].   Annette Cantrell 04/04/2013, 4:28 PM

## 2013-04-04 NOTE — Progress Notes (Signed)
CSW following for return to [facility].  Annette Northup C. Leonore MSW, [address]

## 2013-04-04 NOTE — Progress Notes (Signed)
[facility] PROGRESS NOTE  Annette Cantrell ZOX:[id number] DOB: 1922/07/11 DOA: 03/30/2013 PCP: Clent Demark, MD  Assessment/Plan  Dementia, progressive and leading to dehydration and weight loss.  7 kg weight loss in last year. -  Continue memantine, Remeron, Zoloft, depakote, prn xanax  -  Provided information to family about dementia and encouraged consideration of hospice.  -  Palliative care consultation  AKI, likely due to dehydration, improved with hydration -  FENa << 1% -  UA:  No UTI, mildly concentrated -  RUS:  Medical renal disease, no hydro -  IVF off per palliative conversation about [place] -  No further labs  Lactic acidosis, most likely secondary to dehydration. She denied abdominal pain he did not have tenderness on exam. Her EKG was stable and troponins negative. Chest x-ray was also unremarkable. UA negative. She is not on metformin. Her lactic acidosis resolved quickly with IV fluids.  Atypical chest pain, with ST segment depression in anterior leads. Telemetry demonstrated normal sinus rhythm (with RBBB). Troponins were negative.  She did not have tachycardia or hypoxia to suggest pulmonary embolism, and CXR was negative for infiltrate and pneumothorax.   -  Continue prn maalox -  ECHO:  New severe hypokinesis of the inferior wall and inferolateral wall.  -  Troponin neg -  Telemetry:  NSR, bradycardia -  Telemetry off  CAD s/p MI.  I think this patient has less than 6 months to live and therefore would not be well served by cardiac catheterization.   -  Palliative, symptom management only  ICM, EF 45%.  Medical management -  No diuretics such as lasix or spironolactone due to high risk of dehydration, currently prerenal   COPD without SOB but no wheezing.   -  Wean prednisone to 10mg , then off -  Schedule duonebs  Macrocytic anemia, worsened anemia since earlier this year, likely secondary to CKD -  Iron studies wnl -  B12 wnl -  TSH wnl -   Folate wnl  Thrombocytopenia, appears to be nadiring.  Low suspicion for DIC/TTP -  HIT panel pending -  Smear and diff:  No schistocytes  Severe protein calorie malnutrition.  Liberalize diet, supplements added.  Appreciate nutrition assistance. -  Calorie count -  Started Megace 3/28 -  Continue goals of care conversation -  Information about dementia for family  Right eye conjunctivitis, start polytrim  Diet:  Dysphagia 3 Access:  PIV IVF:  yes Proph:  heparin  Code Status: FULL Family Communication: patient alone, palliative care spoke with daughter and addressed goals of care Disposition Plan: back to [facility] with hospice versus inpatient hospice, awaiting recommendation from [facility]     Consultants:  Palliative care  Procedures:  CXR  ECHO  Antibiotics:  none   HPI/Subjective:  States she feels well.  Eating ice cream and sweets well today.    Objective: Filed Vitals:   04/04/13 0500 04/04/13 0652 04/04/13 1033 04/04/13 1446  BP: 149/60  168/71 135/62  Pulse: 56   65  Temp: 98.5 F (36.9 C)   97.3 F (36.3 C)  TempSrc: Oral   Oral  Resp: 18   14  Height:      Weight:  41.867 kg (92 lb 4.8 oz)    SpO2: 96%   99%    Intake/Output Summary (Last 24 hours) at 04/04/13 1502 Last data filed at 04/04/13 1400  Gross per 24 hour  Intake 2817.42 ml  Output    150 ml  Net 2667.42 ml   Filed Weights   04/02/13 0539 04/03/13 0622 04/04/13 0652  Weight: 38.057 kg (83 lb 14.4 oz) 38.828 kg (85 lb 9.6 oz) 41.867 kg (92 lb 4.8 oz)    Exam:   General:  Thin BF, No acute distress  HEENT:  NCAT, MMM, less purulent discharge  Cardiovascular:  RRR, nl S1, S2 no mrg, 2+ pulses, warm extremities  Respiratory:  CTAB, no increased WOB  Abdomen:   NABS, soft, NT/ND  MSK:   Normal tone and bulk, no LEE  Neuro:  Grossly intact  Psych:  Pleasantly confused  Data Reviewed: Basic Metabolic Panel:  Recent Labs Lab 03/31/13 0220 04/01/13 0455  04/02/13 0420 04/03/13 0335 04/04/13 0438  NA 145 142 141 142 146  K 4.1 3.6* 4.3 4.2 4.2  CL 107 102 106 106 115*  CO2 22 25 26 22 22   GLUCOSE 156* 70 99 92 88  BUN 22 15 30* 44* 36*  CREATININE 1.08 0.87 1.92* 2.38* 1.65*  CALCIUM 9.3 9.9 9.3 9.1 8.9   Liver Function Tests:  Recent Labs Lab 03/30/13 2058 03/31/13 0220  AST 28 18  ALT 10 8  ALKPHOS 68 65  BILITOT <0.2* <0.2*  PROT 6.6 6.2  ALBUMIN 3.1* 2.8*   No results found for this basename: LIPASE, AMYLASE,  in the last 168 hours No results found for this basename: AMMONIA,  in the last 168 hours CBC:  Recent Labs Lab 03/31/13 0220 04/01/13 0455 04/02/13 0420 04/03/13 0335 04/04/13 0438  WBC 4.6 8.8 5.3 5.7 6.1  NEUTROABS  --  4.5  --   --   --   HGB 9.8* 12.7 10.3* 8.6* 8.6*  HCT 29.9* 39.1 31.2* 26.6* 27.3*  MCV 105.3* 105.7* 104.3* 106.0* 105.8*  PLT 103* 82* 75* 75* 80*   Cardiac Enzymes:  Recent Labs Lab 03/31/13 0220 03/31/13 0830 03/31/13 1335  TROPONINI <0.30 <0.30 <0.30   BNP (last 3 results) No results found for this basename: PROBNP,  in the last 8760 hours CBG: No results found for this basename: GLUCAP,  in the last 168 hours  Recent Results (from the past 240 hour(s))  CULTURE, BLOOD (ROUTINE X 2)     Status: None   Collection Time    03/30/13  9:30 PM      Result Value Ref Range Status   Specimen Description BLOOD RIGHT ARM   Final   Special Requests BOTTLES DRAWN AEROBIC AND ANAEROBIC 5CC   Final   Culture  Setup Time     Final   Value: 03/31/2013 00:56     Performed at [facility]   Culture     Final   Value:        BLOOD CULTURE RECEIVED NO GROWTH TO DATE CULTURE WILL BE HELD FOR 5 DAYS BEFORE ISSUING A FINAL NEGATIVE REPORT     Performed at [facility]   Report Status PENDING   Incomplete     Studies: US Renal  04/03/2013   CLINICAL DATA:  Acute kidney injury.  EXAM: RENAL/URINARY TRACT ULTRASOUND COMPLETE  COMPARISON:  None.  FINDINGS: Right Kidney:   Length: 9.1 cm. Increased parenchymal echogenicity. Small, 9 mm, mid to upper pole cyst. No other renal masses. No stones. No hydronephrosis.  Left Kidney:  Length: 8.2 cm. Increased renal parenchymal echogenicity. 1 cm mid to upper pole cyst. No other renal masses, no stones and no hydronephrosis.  Bladder:  Appears normal for degree of bladder distention.  IMPRESSION: 1. Small kidneys with increased renal parenchymal echogenicity consistent with medical renal disease. 2. Small single cysts in each kidney.  No other masses. 3. No hydronephrosis.   Electronically Signed   By: Lajean Manes M.D.   On: 04/03/2013 10:57    Scheduled Meds: . amLODipine  5 mg Oral Daily  . divalproex  250 mg Oral QID  . feeding supplement (ENSURE COMPLETE)  237 mL Oral BID BM  . Memantine HCl ER  21 mg Oral Daily  . metoprolol succinate  25 mg Oral Daily  . mirtazapine  7.5 mg Oral QHS  . OLANZapine zydis  5 mg Oral QHS  . pantoprazole  40 mg Oral BID AC  . predniSONE  20 mg Oral Q breakfast  . sertraline  50 mg Oral Daily  . sodium chloride  3 mL Intravenous Q12H  . trimethoprim-polymyxin b  1 drop Both Eyes 4 times per day   Continuous Infusions: . sodium chloride 10 mL/hr at 04/04/13 1038    Principal Problem:   Lactic acidosis Active Problems:   Dementia   HTN (hypertension)   COPD (chronic obstructive pulmonary disease)   Chest pain   Thrombocytopenia, unspecified   Protein-calorie malnutrition, severe   Malnutrition of moderate degree   Palliative care encounter   Dysphagia, unspecified(787.20)   Weakness generalized    Time spent: 30 min    Mary-Ann Pennella, [place] [facility] Pager [phone]. If 7PM-7AM, please contact night-coverage at [email or web address], password [facility] 04/04/2013, 3:02 PM  LOS: 5 days

## 2013-04-05 LAB — HEPARIN INDUCED THROMBOCYTOPENIA PNL
HEPARIN INDUCED PLT AB: NEGATIVE
Patient O.D.: 0.027
UFH HIGH DOSE UFH H: 0 %
UFH LOW DOSE 0.5 IU/ML: 0 %
UFH Low Dose 0.1 IU/mL: 0 % Release
UFH SRA Result: NEGATIVE

## 2013-04-05 MED ORDER — ALBUTEROL SULFATE (2.5 MG/3ML) 0.083% IN NEBU: 3.0000 mL | INHALATION_SOLUTION | RESPIRATORY_TRACT | Status: DC | PRN

## 2013-04-05 MED ORDER — MORPHINE SULFATE (CONCENTRATE) 10 MG /0.5 ML PO SOLN: 5.0000 mg | ORAL | Status: DC | PRN

## 2013-04-05 MED ORDER — ENSURE COMPLETE PO LIQD: 237.0000 mL | Freq: Two times a day (BID) | ORAL | Status: DC

## 2013-04-05 MED ORDER — MEMANTINE HCL ER 21 MG PO CP24: 21.0000 mg | ORAL_CAPSULE | Freq: Every day | ORAL | Status: DC

## 2013-04-05 MED ORDER — OLANZAPINE 5 MG PO TBDP: 5.0000 mg | ORAL_TABLET | Freq: Every day | ORAL | Status: DC

## 2013-04-05 NOTE — Discharge Summary (Signed)
Physician Discharge Summary  Annette Cantrell UDJ:[id number] DOB: [birth year] DOA: 03/30/2013  PCP: Clent Demark, MD  Admit date: 03/30/2013 Discharge date: 04/05/2013  Time spent: > 35  minutes  Recommendations for Outpatient Follow-up:  1. Please be sure to continue comfort care measures.  Discharge Diagnoses:  Principal Problem:   Lactic acidosis Active Problems:   Dementia   HTN (hypertension)   COPD (chronic obstructive pulmonary disease)   Chest pain   Thrombocytopenia, unspecified   Protein-calorie malnutrition, severe   Malnutrition of moderate degree   Palliative care encounter   Dysphagia, unspecified(787.20)   Weakness generalized   Discharge Condition: stable  Diet recommendation: comfort care feeds/as tolerated  Filed Weights   04/03/13 0622 04/04/13 0652 04/05/13 0441  Weight: 38.828 kg (85 lb 9.6 oz) 41.867 kg (92 lb 4.8 oz) 42.366 kg (93 lb 6.4 oz)    History of present illness:  From Palliative care notes: Annette Cantrell is a 80 y.o. female with Past medical history of severe dementia, CVA, dyslipidemia, COPD. Daughter reports continued physical, functional and cognitive decline over the past 6 months. Poor po intake, albumin 2.2.  Focus of care is now full comfort, she will benefit from hospice services at there [facility] Course:  Brief HYI:FOYDXAJO Annette Cantrell is a 80 y.o. female with Past medical history of severe dementia, CVA, dyslipidemia, COPD.  The patient is resident of [facility].  Patient was brought in by the nursing home personnel as she was complaining of some chest tightness On further workup the patient was found to be having elevated lactic acidosis and hospitalist was asked to admit the patient  And at her baseline dependent for most of her ADL. Daughter reports continued physical, functional and cognitive decline over the past 6 months.    Goals of Care:  1. Code Status:DNR/DNI-comfort is main focus of care  2. Scope of  Treatment:  1. Vital Signs: daily  2. Respiratory/Oxygen:for comfort only 3. Nutritional Support/Tube Feeds:no artificial feeding now or in the future 4. Antibiotics: oral only 5. Blood Products:none 6. INO:MVEH 7. Review of Medications to be discontinued:minimize for comfort 8. Labs:none 9. Telemetry:none 4. Disposition: Back to [facility] with hospice services in place. Daughter and facility requested [facility] will relay this info to case manager.  3. Symptom Management:  1. Anxiety/Agitation: Continue Xanax,Depakote, Memantine, Zyprexa as previously written 2. Pain/Dyspnea: Roxanol 5 mg po/sl every 3 hrs prn 3. Bowel Regimen: Dulcolax supp prn 4. Dysphagia: diet as tolerated with known risk of aspiration 4. Psychosocial: Emotional support to daughter, she verbalizes an understanding of her moth's limited prognosis and hopes for comfort at this time   Procedures: none  Consultations:  Palliative care  Discharge Exam: Filed Vitals:   04/05/13 0530  BP: 167/70  Pulse: 63  Temp: 98.1 F (36.7 C)  Resp: 16    General: Pt in NAD, resting comfortably Cardiovascular: normal s1 and s2, no rubs Respiratory: no increased work of breathing, no wheezes  Discharge Instructions  Discharge Orders   Future Orders Complete By Expires   Call MD for:  severe uncontrolled pain  As directed    Call MD for:  temperature >100.4  As directed    Diet - low sodium heart healthy  As directed    Discharge instructions  As directed    Comments:     Please continue to ensure comfort care measures.   Increase activity slowly  As directed        Medication List  STOP taking these medications       albuterol 108 (90 BASE) MCG/ACT inhaler  Commonly known as:  PROVENTIL HFA;VENTOLIN HFA  Replaced by:  albuterol (2.5 MG/3ML) 0.083% nebulizer solution     amLODipine 5 MG tablet  Commonly known as:  NORVASC     aspirin EC 81 MG tablet     atorvastatin 10 MG tablet  Commonly known  as:  LIPITOR     cholecalciferol 1000 UNITS tablet  Commonly known as:  VITAMIN D     ferrous sulfate 325 (65 FE) MG tablet     metoprolol succinate 25 MG 24 hr tablet  Commonly known as:  TOPROL-XL     mirtazapine 15 MG tablet  Commonly known as:  REMERON            potassium chloride 10 MEQ tablet  Commonly known as:  K-DUR,KLOR-CON      TAKE these medications       acetaminophen 325 MG tablet  Commonly known as:  TYLENOL  Take 650 mg by mouth every morning.     albuterol (2.5 MG/3ML) 0.083% nebulizer solution  Commonly known as:  PROVENTIL  Inhale 3 mLs into the lungs every 4 (four) hours as needed for wheezing or shortness of breath.     ALPRAZolam 0.25 MG tablet  Commonly known as:  XANAX  Take 1 tablet (0.25 mg total) by mouth at bedtime as needed for anxiety.     divalproex 125 MG capsule  Commonly known as:  DEPAKOTE SPRINKLE  Take 250 mg by mouth 4 (four) times daily.     morphine CONCENTRATE 10 mg / 0.5 ml concentrated solution  Take 0.25 mLs (5 mg total) by mouth every 3 (three) hours as needed for moderate pain, severe pain or shortness of breath.     feeding supplement (ENSURE COMPLETE) Liqd  Take 237 mLs by mouth 2 (two) times daily between meals.     NUTRITIONAL SUPPLEMENT PO  Take 1 Container by mouth 3 (three) times daily with meals. Great Shakes     OLANZapine zydis 5 MG disintegrating tablet  Commonly known as:  ZYPREXA  Take 1 tablet (5 mg total) by mouth at bedtime.     sertraline 50 MG tablet  Commonly known as:  ZOLOFT  Take 50 mg by mouth daily.    NAMENDA XR 21 MG Cp24  Generic drug:  Memantine HCl ER     No Known Allergies    The results of significant diagnostics from this hospitalization (including imaging, microbiology, ancillary and laboratory) are listed below for reference.    Significant Diagnostic Studies: Dg Chest 2 View (if Patient Has Fever And/or Copd)  03/30/2013   CLINICAL DATA:  Respiratory distress  EXAM:  CHEST  2 VIEW  COMPARISON:  01/02/2013  FINDINGS: Mild chronic pulmonary hyperinflation. No edema, consolidation, effusion, or pneumothorax. There is a 15 mm spiculated nodule in the right upper lung which has been present since at least 2010. No appreciable enlargement. Normal heart size. Chronic aortic tortuosity.  IMPRESSION: COPD without superimposed edema or pneumonia.   Electronically Signed   By: Jorje Guild M.D.   On: 03/30/2013 16:41   US Renal  04/03/2013   CLINICAL DATA:  Acute kidney injury.  EXAM: RENAL/URINARY TRACT ULTRASOUND COMPLETE  COMPARISON:  None.  FINDINGS: Right Kidney:  Length: 9.1 cm. Increased parenchymal echogenicity. Small, 9 mm, mid to upper pole cyst. No other renal masses. No stones. No hydronephrosis.  Left Kidney:  Length:  8.2 cm. Increased renal parenchymal echogenicity. 1 cm mid to upper pole cyst. No other renal masses, no stones and no hydronephrosis.  Bladder:  Appears normal for degree of bladder distention.  IMPRESSION: 1. Small kidneys with increased renal parenchymal echogenicity consistent with medical renal disease. 2. Small single cysts in each kidney.  No other masses. 3. No hydronephrosis.   Electronically Signed   By: Lajean Manes M.D.   On: 04/03/2013 10:57    Microbiology: Recent Results (from the past 240 hour(s))  CULTURE, BLOOD (ROUTINE X 2)     Status: None   Collection Time    03/30/13  9:30 PM      Result Value Ref Range Status   Specimen Description BLOOD RIGHT ARM   Final   Special Requests BOTTLES DRAWN AEROBIC AND ANAEROBIC 5CC   Final   Culture  Setup Time     Final   Value: 03/31/2013 00:56     Performed at [facility]   Culture     Final   Value:        BLOOD CULTURE RECEIVED NO GROWTH TO DATE CULTURE WILL BE HELD FOR 5 DAYS BEFORE ISSUING A FINAL NEGATIVE REPORT     Performed at [facility]   Report Status PENDING   Incomplete     Labs: Basic Metabolic Panel:  Recent Labs Lab 03/31/13 0220  04/01/13 0455 04/02/13 0420 04/03/13 0335 04/04/13 0438  NA 145 142 141 142 146  K 4.1 3.6* 4.3 4.2 4.2  CL 107 102 106 106 115*  CO2 22 25 26 22 22   GLUCOSE 156* 70 99 92 88  BUN 22 15 30* 44* 36*  CREATININE 1.08 0.87 1.92* 2.38* 1.65*  CALCIUM 9.3 9.9 9.3 9.1 8.9   Liver Function Tests:  Recent Labs Lab 03/30/13 2058 03/31/13 0220  AST 28 18  ALT 10 8  ALKPHOS 68 65  BILITOT <0.2* <0.2*  PROT 6.6 6.2  ALBUMIN 3.1* 2.8*   No results found for this basename: LIPASE, AMYLASE,  in the last 168 hours No results found for this basename: AMMONIA,  in the last 168 hours CBC:  Recent Labs Lab 03/31/13 0220 04/01/13 0455 04/02/13 0420 04/03/13 0335 04/04/13 0438  WBC 4.6 8.8 5.3 5.7 6.1  NEUTROABS  --  4.5  --   --   --   HGB 9.8* 12.7 10.3* 8.6* 8.6*  HCT 29.9* 39.1 31.2* 26.6* 27.3*  MCV 105.3* 105.7* 104.3* 106.0* 105.8*  PLT 103* 82* 75* 75* 80*   Cardiac Enzymes:  Recent Labs Lab 03/31/13 0220 03/31/13 0830 03/31/13 1335  TROPONINI <0.30 <0.30 <0.30   BNP: BNP (last 3 results) No results found for this basename: PROBNP,  in the last 8760 hours CBG: No results found for this basename: GLUCAP,  in the last 168 hours     Signed:  Velvet Bathe  [facility] 04/05/2013, 2:20 PM

## 2013-04-05 NOTE — Progress Notes (Signed)
Patient cleared for discharge. Packet copied and placed in [place]. CSW called patient's granddaughter and left voicemail informing of discharge. ptar called for transportation.  Eian Vandervelden C. [place] MSW, [place]

## 2013-04-06 LAB — CULTURE, BLOOD (ROUTINE X 2): Culture: NO GROWTH

## 2013-04-24 NOTE — Consult Note (Signed)
I have reviewed and discussed the care of this patient in detail with the nurse practitioner including pertinent patient records, physical exam findings and data. I agree with details of this encounter.  

## 2013-05-16 ENCOUNTER — Encounter (HOSPITAL_COMMUNITY): Payer: Self-pay | Admitting: Emergency Medicine

## 2013-05-16 ENCOUNTER — Emergency Department (HOSPITAL_COMMUNITY)
Admission: EM | Admit: 2013-05-16 | Discharge: 2013-05-16 | Disposition: A | Discharge status: Home / Self Care | Attending: Emergency Medicine | Admitting: Emergency Medicine

## 2013-05-16 DIAGNOSIS — Z8673 Personal history of transient ischemic attack (TIA), and cerebral infarction without residual deficits: Secondary | ICD-10-CM | POA: Insufficient documentation

## 2013-05-16 DIAGNOSIS — Y9210 Unspecified residential institution as the place of occurrence of the external cause: Secondary | ICD-10-CM | POA: Insufficient documentation

## 2013-05-16 DIAGNOSIS — Z872 Personal history of diseases of the skin and subcutaneous tissue: Secondary | ICD-10-CM | POA: Insufficient documentation

## 2013-05-16 DIAGNOSIS — I2510 Atherosclerotic heart disease of native coronary artery without angina pectoris: Secondary | ICD-10-CM | POA: Insufficient documentation

## 2013-05-16 DIAGNOSIS — J449 Chronic obstructive pulmonary disease, unspecified: Secondary | ICD-10-CM | POA: Insufficient documentation

## 2013-05-16 DIAGNOSIS — F17200 Nicotine dependence, unspecified, uncomplicated: Secondary | ICD-10-CM | POA: Insufficient documentation

## 2013-05-16 DIAGNOSIS — W19XXXA Unspecified fall, initial encounter: Secondary | ICD-10-CM

## 2013-05-16 DIAGNOSIS — I5022 Chronic systolic (congestive) heart failure: Secondary | ICD-10-CM | POA: Insufficient documentation

## 2013-05-16 DIAGNOSIS — Y9389 Activity, other specified: Secondary | ICD-10-CM | POA: Insufficient documentation

## 2013-05-16 DIAGNOSIS — J4489 Other specified chronic obstructive pulmonary disease: Secondary | ICD-10-CM | POA: Insufficient documentation

## 2013-05-16 DIAGNOSIS — F0390 Unspecified dementia without behavioral disturbance: Secondary | ICD-10-CM | POA: Insufficient documentation

## 2013-05-16 DIAGNOSIS — Z8619 Personal history of other infectious and parasitic diseases: Secondary | ICD-10-CM | POA: Insufficient documentation

## 2013-05-16 DIAGNOSIS — Z79899 Other long term (current) drug therapy: Secondary | ICD-10-CM | POA: Insufficient documentation

## 2013-05-16 DIAGNOSIS — R296 Repeated falls: Secondary | ICD-10-CM | POA: Insufficient documentation

## 2013-05-16 DIAGNOSIS — Z043 Encounter for examination and observation following other accident: Secondary | ICD-10-CM | POA: Insufficient documentation

## 2013-05-16 DIAGNOSIS — E7800 Pure hypercholesterolemia, unspecified: Secondary | ICD-10-CM | POA: Insufficient documentation

## 2013-05-16 NOTE — Discharge Instructions (Signed)

## 2013-05-16 NOTE — ED Notes (Signed)
Bed: FG18 Expected date:  Expected time:  Means of arrival:  Comments: EMS/elderly fall-hit head-headache

## 2013-05-16 NOTE — ED Notes (Signed)
Pt is a Hospice pt from [facility] with DNR at bedside. Pt BIB EMS, staff reports that they heard a "bump" in her BR and found her sitting in the BR stall. Pt denied any complaints initially and was put back to bed. After 15 minutes pt began to complain of a HA, pt denies all complaints of pain at this time. Pt has a hx of Alzheimer's and is currently alert, pt states she is oriented at baseline.

## 2013-05-16 NOTE — ED Provider Notes (Addendum)
CSN: [id number]     Arrival date & time 05/16/13  0437 History   First MD Initiated Contact with Patient 05/16/13 0654     Chief Complaint  Patient presents with  . Fall     (Consider location/radiation/quality/duration/timing/severity/associated sxs/prior Treatment) Patient is a 80 y.o. female presenting with fall. The history is provided by the patient, the EMS personnel and the nursing home. The history is limited by the absence of a caregiver.  Fall This is a new (nursing home states they heard a thump and she was lying in the floor next to her bed.  then 50min later she stated she had a headache per facility) problem. The current episode started yesterday. The problem occurs constantly. The problem has been resolved. Associated symptoms comments: Pt denies HA, abd/chest pain, SOB or ext pain.  No neck pain.  No LOC.. Nothing aggravates the symptoms. Nothing relieves the symptoms. She has tried nothing for the symptoms.    Past Medical History  Diagnosis Date  . Dementia   . Stroke   . High cholesterol   . Heart disease, unspecified   . LUNG NODULE 03/30/2008    Qualifier: Diagnosis of  By: [name] MD, [name]   . COPD 03/30/2008    Qualifier: Diagnosis of  By: [name] MD, [name]   . Infected sebaceous cyst 01/25/2012  . Herpes zoster 01/30/2012  . History of positive PPD, untreated   . CAD (coronary artery disease)   . Chronic systolic heart failure    Past Surgical History  Procedure Laterality Date  . Hip arthroplasty  01/31/2012    Procedure: ARTHROPLASTY BIPOLAR HIP;  Surgeon: Rozanna Box, MD;  Location: [facility];  Service: Orthopedics;  Laterality: Right;   Family History  Problem Relation Age of Onset  . Stroke Mother    History  Substance Use Topics  . Smoking status: Current Every Day Smoker -- 0.20 packs/day    Types: Cigarettes  . Smokeless tobacco: Never Used  . Alcohol Use: No   OB History   Grav Para Term Preterm Abortions TAB SAB Ect Mult Living            Review of Systems  Unable to perform ROS     Allergies  Review of patient's allergies indicates no known allergies.  Home Medications   Prior to Admission medications   Medication Sig Start Date End Date Taking? Authorizing Provider  acetaminophen (TYLENOL) 325 MG tablet Take 650 mg by mouth every morning.   Yes Historical Provider, MD  albuterol (PROVENTIL) (2.5 MG/3ML) 0.083% nebulizer solution Inhale 3 mLs into the lungs every 4 (four) hours as needed for wheezing or shortness of breath. 04/05/13  Yes Velvet Bathe, MD  ALPRAZolam Duanne Moron) 0.25 MG tablet Take 1 tablet (0.25 mg total) by mouth at bedtime as needed for anxiety. 01/02/13  Yes Dot Lanes, MD  atropine 1 % ophthalmic solution Place 2 drops under the tongue every 4 (four) hours as needed (Wet throat secretions).   Yes Historical Provider, MD  divalproex (DEPAKOTE SPRINKLE) 125 MG capsule Take 250 mg by mouth 4 (four) times daily.    Yes Historical Provider, MD  Morphine Sulfate (MORPHINE CONCENTRATE) 10 mg / 0.5 ml concentrated solution Take 0.25 mLs (5 mg total) by mouth every 3 (three) hours as needed for moderate pain, severe pain or shortness of breath. 04/05/13  Yes Velvet Bathe, MD  Nutritional Supplements (NUTRITIONAL SUPPLEMENT PO) Take 1 Container by mouth 3 (three) times daily with  meals. Great Shakes   Yes Historical Provider, MD  OLANZapine zydis (ZYPREXA) 5 MG disintegrating tablet Take 1 tablet (5 mg total) by mouth at bedtime. 04/05/13  Yes Velvet Bathe, MD  sertraline (ZOLOFT) 50 MG tablet Take 50 mg by mouth daily.   Yes Historical Provider, MD   BP 144/73  Pulse 62  Temp(Src) 98.1 F (36.7 C) (Oral)  Resp 16  SpO2 94% Physical Exam  Nursing note and vitals reviewed. Constitutional: She appears well-developed and well-nourished. No distress.  HENT:  Head: Normocephalic and atraumatic.  Mouth/Throat: Oropharynx is clear and moist.  No hematomas or contusion  Eyes: Conjunctivae and EOM are  normal. Pupils are equal, round, and reactive to light.  Neck: Normal range of motion. Neck supple. No spinous process tenderness and no muscular tenderness present.  Cardiovascular: Normal rate, regular rhythm and intact distal pulses.   No murmur heard. Pulmonary/Chest: Effort normal and breath sounds normal. No respiratory distress. She has no wheezes. She has no rales.  Abdominal: Soft. She exhibits no distension. There is no tenderness. There is no rebound and no guarding.  Musculoskeletal: Normal range of motion. She exhibits no edema and no tenderness.  Full ROM of all 4 ext without difficulty or pain.    Neurological: She is alert.  Oriented to self  Skin: Skin is warm and dry. No rash noted. No erythema.  Psychiatric: She has a normal mood and affect. Her behavior is normal.    ED Course  Procedures (including critical care time) Labs Review Labs Reviewed - No data to display  Imaging Review No results found.   EKG Interpretation None      MDM   Final diagnoses:  Fall   Patient sent in from nursing home for evaluation after a fall. Patient has no complaints. She is awake and alert. She denies pain and has no external signs of trauma. Full range of motion of all 4 extremities.  No hematomas, contusion or sign of injury to the head. Full range of motion of the neck. Patient had a normal exam and was discharged back home. No LOC per facility and patient is not taking anticoagulation.    Blanchie Dessert, MD 05/16/13 9326  Blanchie Dessert, MD 05/16/13 [phone]

## 2013-07-02 ENCOUNTER — Encounter (HOSPITAL_COMMUNITY): Payer: Self-pay | Admitting: Emergency Medicine

## 2013-07-02 ENCOUNTER — Emergency Department (HOSPITAL_COMMUNITY)
Admission: EM | Admit: 2013-07-02 | Discharge: 2013-07-03 | Disposition: A | Discharge status: Home / Self Care | Payer: Medicare Other | Attending: Emergency Medicine | Admitting: Emergency Medicine

## 2013-07-02 DIAGNOSIS — N179 Acute kidney failure, unspecified: Secondary | ICD-10-CM | POA: Insufficient documentation

## 2013-07-02 DIAGNOSIS — E7800 Pure hypercholesterolemia, unspecified: Secondary | ICD-10-CM | POA: Diagnosis not present

## 2013-07-02 DIAGNOSIS — D638 Anemia in other chronic diseases classified elsewhere: Secondary | ICD-10-CM

## 2013-07-02 DIAGNOSIS — Z8673 Personal history of transient ischemic attack (TIA), and cerebral infarction without residual deficits: Secondary | ICD-10-CM | POA: Insufficient documentation

## 2013-07-02 DIAGNOSIS — R1012 Left upper quadrant pain: Secondary | ICD-10-CM | POA: Diagnosis present

## 2013-07-02 DIAGNOSIS — Z79899 Other long term (current) drug therapy: Secondary | ICD-10-CM | POA: Insufficient documentation

## 2013-07-02 DIAGNOSIS — J449 Chronic obstructive pulmonary disease, unspecified: Secondary | ICD-10-CM | POA: Diagnosis not present

## 2013-07-02 DIAGNOSIS — F0390 Unspecified dementia without behavioral disturbance: Secondary | ICD-10-CM | POA: Diagnosis not present

## 2013-07-02 DIAGNOSIS — R079 Chest pain, unspecified: Secondary | ICD-10-CM | POA: Insufficient documentation

## 2013-07-02 DIAGNOSIS — Z8619 Personal history of other infectious and parasitic diseases: Secondary | ICD-10-CM | POA: Insufficient documentation

## 2013-07-02 DIAGNOSIS — I5022 Chronic systolic (congestive) heart failure: Secondary | ICD-10-CM | POA: Diagnosis not present

## 2013-07-02 DIAGNOSIS — J4489 Other specified chronic obstructive pulmonary disease: Secondary | ICD-10-CM | POA: Insufficient documentation

## 2013-07-02 DIAGNOSIS — E875 Hyperkalemia: Secondary | ICD-10-CM | POA: Insufficient documentation

## 2013-07-02 DIAGNOSIS — I2510 Atherosclerotic heart disease of native coronary artery without angina pectoris: Secondary | ICD-10-CM | POA: Diagnosis not present

## 2013-07-02 DIAGNOSIS — F17200 Nicotine dependence, unspecified, uncomplicated: Secondary | ICD-10-CM | POA: Insufficient documentation

## 2013-07-02 DIAGNOSIS — D649 Anemia, unspecified: Secondary | ICD-10-CM | POA: Diagnosis not present

## 2013-07-02 LAB — COMPREHENSIVE METABOLIC PANEL
ALT: 6 U/L (ref 0–35)
AST: 17 U/L (ref 0–37)
Albumin: 2.8 g/dL — ABNORMAL LOW (ref 3.5–5.2)
Alkaline Phosphatase: 92 U/L (ref 39–117)
BUN: 34 mg/dL — AB (ref 6–23)
CALCIUM: 9.8 mg/dL (ref 8.4–10.5)
CO2: 22 meq/L (ref 19–32)
CREATININE: 1.3 mg/dL — AB (ref 0.50–1.10)
Chloride: 108 mEq/L (ref 96–112)
GFR calc non Af Amer: 36 mL/min — ABNORMAL LOW (ref >90)
GFR, EST AFRICAN AMERICAN: 41 mL/min — AB (ref >90)
GLUCOSE: 90 mg/dL (ref 70–99)
Potassium: 6 mEq/L — ABNORMAL HIGH (ref 3.7–5.3)
Sodium: 144 mEq/L (ref 137–147)
Total Protein: 7.3 g/dL (ref 6.0–8.3)

## 2013-07-02 LAB — TROPONIN I

## 2013-07-02 LAB — I-STAT CG4 LACTIC ACID, ED: Lactic Acid, Venous: 0.92 mmol/L (ref 0.5–2.2)

## 2013-07-02 LAB — LIPASE, BLOOD: Lipase: 38 U/L (ref 11–59)

## 2013-07-02 NOTE — ED Notes (Signed)
Pt not answering questions

## 2013-07-02 NOTE — ED Provider Notes (Signed)
CSN: [id number]     Arrival date & time 07/02/13  2225 History   First MD Initiated Contact with Patient 07/02/13 2258     Chief Complaint  Patient presents with  . Abdominal Pain     (Consider location/radiation/quality/duration/timing/severity/associated sxs/prior Treatment) HPI Comments: 80 year old female and the nursing home with history of dementia, COPD, renal failure, anemia, lipids, protein malnutrition, palliative care presents for reported left-sided abdominal pain prior to arrival. Per report pain is worse with palpation and pain is resolved in ER. Patient denies all symptoms currently however has dementia and is currently at baseline per report. Patient has not had recent illness or fevers per report. Unable to get details from patient and no family at bedside.  Patient is a 80 y.o. female presenting with abdominal pain. The history is provided by the patient.  Abdominal Pain   Past Medical History  Diagnosis Date  . Dementia   . Stroke   . High cholesterol   . Heart disease, unspecified   . LUNG NODULE 03/30/2008    Qualifier: Diagnosis of  By: [name] MD, [name]   . COPD 03/30/2008    Qualifier: Diagnosis of  By: [name] MD, [name]   . Infected sebaceous cyst 01/25/2012  . Herpes zoster 01/30/2012  . History of positive PPD, untreated   . CAD (coronary artery disease)   . Chronic systolic heart failure    Past Surgical History  Procedure Laterality Date  . Hip arthroplasty  01/31/2012    Procedure: ARTHROPLASTY BIPOLAR HIP;  Surgeon: Rozanna Box, MD;  Location: [facility];  Service: Orthopedics;  Laterality: Right;   Family History  Problem Relation Age of Onset  . Stroke Mother    History  Substance Use Topics  . Smoking status: Current Every Day Smoker -- 0.20 packs/day    Types: Cigarettes  . Smokeless tobacco: Never Used  . Alcohol Use: No   OB History   Grav Para Term Preterm Abortions TAB SAB Ect Mult Living                 Review of Systems   Unable to perform ROS: Dementia  Gastrointestinal: Positive for abdominal pain.      Allergies  Review of patient's allergies indicates no known allergies.  Home Medications   Prior to Admission medications   Medication Sig Start Date End Date Taking? Authorizing Provider  acetaminophen (TYLENOL) 325 MG tablet Take 650 mg by mouth every morning.    Historical Provider, MD  albuterol (PROVENTIL) (2.5 MG/3ML) 0.083% nebulizer solution Inhale 3 mLs into the lungs every 4 (four) hours as needed for wheezing or shortness of breath. 04/05/13   Velvet Bathe, MD  ALPRAZolam Duanne Moron) 0.25 MG tablet Take 1 tablet (0.25 mg total) by mouth at bedtime as needed for anxiety. 01/02/13   Dot Lanes, MD  atropine 1 % ophthalmic solution Place 2 drops under the tongue every 4 (four) hours as needed (Wet throat secretions).    Historical Provider, MD  divalproex (DEPAKOTE SPRINKLE) 125 MG capsule Take 250 mg by mouth 4 (four) times daily.     Historical Provider, MD  Morphine Sulfate (MORPHINE CONCENTRATE) 10 mg / 0.5 ml concentrated solution Take 0.25 mLs (5 mg total) by mouth every 3 (three) hours as needed for moderate pain, severe pain or shortness of breath. 04/05/13   Velvet Bathe, MD  Nutritional Supplements (NUTRITIONAL SUPPLEMENT PO) Take 1 Container by mouth 3 (three) times daily with meals. [employer]  Historical Provider, MD  OLANZapine zydis (ZYPREXA) 5 MG disintegrating tablet Take 1 tablet (5 mg total) by mouth at bedtime. 04/05/13   Velvet Bathe, MD  sertraline (ZOLOFT) 50 MG tablet Take 50 mg by mouth daily.    Historical Provider, MD   BP 118/77  Pulse 78  Temp(Src) 97.9 F (36.6 C) (Oral)  Resp 14  SpO2 97% Physical Exam  Nursing note and vitals reviewed. Constitutional: She is oriented to person, place, and time. She appears well-developed and well-nourished.  HENT:  Head: Normocephalic and atraumatic.  Eyes: Conjunctivae are normal. Right eye exhibits no discharge. Left eye  exhibits no discharge.  Neck: Normal range of motion. Neck supple. No tracheal deviation present.  Cardiovascular: Normal rate, regular rhythm and intact distal pulses.   Pulmonary/Chest: Effort normal and breath sounds normal.  Abdominal: Soft. She exhibits no distension. There is no tenderness. There is no guarding.  Musculoskeletal: She exhibits no edema.  Neurological: She is alert and oriented to person, place, and time.  Skin: Skin is warm. No rash noted.  Psychiatric: She has a normal mood and affect.    ED Course  Procedures (including critical care time) EMERGENCY DEPARTMENT ULTRASOUND  Study: Limited Retroperitoneal Ultrasound of the Abdominal Aorta.  INDICATIONS:Abdominal pain and Age>55 Multiple views of the abdominal aorta were obtained in real-time from the diaphragmatic hiatus to the aortic bifurcation in transverse planes with a multi-frequency probe. PERFORMED BY: Myself IMAGES ARCHIVED?: Yes FINDINGS: Maximum aortic dimensions are 2.8 cm LIMITATIONS:  Bowel gas INTERPRETATION:  No abdominal aortic aneurysm  \  Labs Review Labs Reviewed  CBC WITH DIFFERENTIAL - Abnormal; Notable for the following:    RBC 3.03 (*)    Hemoglobin 10.3 (*)    HCT 33.1 (*)    MCV 109.2 (*)    Neutrophils Relative % 39 (*)    All other components within normal limits  COMPREHENSIVE METABOLIC PANEL - Abnormal; Notable for the following:    Potassium 6.0 (*)    BUN 34 (*)    Creatinine, Ser 1.30 (*)    Albumin 2.8 (*)    Total Bilirubin <0.2 (*)    GFR calc non Af Amer 36 (*)    GFR calc Af Amer 41 (*)    All other components within normal limits  URINALYSIS, ROUTINE W REFLEX MICROSCOPIC - Abnormal; Notable for the following:    Ketones, ur 15 (*)    All other components within normal limits  POTASSIUM - Abnormal; Notable for the following:    Potassium 6.1 (*)    All other components within normal limits  TROPONIN I  LIPASE, BLOOD  MAGNESIUM  TROPONIN I  I-STAT CG4  LACTIC ACID, ED    Imaging Review Ct Abdomen Pelvis Wo Contrast  07/03/2013   CLINICAL DATA:  The left upper quadrant pain. Intermittent left flank pain.  EXAM: CT ABDOMEN AND PELVIS WITHOUT CONTRAST  TECHNIQUE: Multidetector CT imaging of the abdomen and pelvis was performed following the standard protocol without IV contrast.  COMPARISON:  None.  FINDINGS: BODY WALL: Unremarkable.  LOWER CHEST: No acute findings. Coronary and aortic atherosclerosis.  ABDOMEN/PELVIS:  Liver: Stable presumed 1 cm cyst in the central upper liver.  Biliary: Prominence of the upper common bile duct, stable from 2010. No calcified gallstone.  Pancreas: Unremarkable.  Spleen: Unremarkable.  Adrenals: Unremarkable.  Kidneys and ureters: No hydronephrosis or stone. Suspect bilateral sub cm renal cysts.  Bladder: Obscured by right hip prosthesis.  Reproductive: Partially obscured by  right hip prosthesis. There are coarse calcifications in the atrophic uterus consistent with hyalinized fibroids. Coarse calcifications within the left ovary, essentially stable from 2010.  Bowel: Severe colonic diverticulosis. No definite active inflammation. Haziness of omental fat in the left upper quadrant is likely stable from previous. No bowel obstruction. Negative appendix.  Retroperitoneum: No mass or adenopathy.  Peritoneum: Trace pelvic fluid, unexpected for age. This could be from volume overload or occult inflammation.  Vascular: Infrarenal abdominal aortic aneurysm measuring 2.7 cm in maximal diameter.  OSSEOUS: Bipolar right hip hemiarthroplasty. Imaged portions are without adverse finding. Advanced osteopenia. Grade 2 L4-5 anterolisthesis associated with advanced facet osteoarthritis.  IMPRESSION: 1. No definitive source of flank pain. No hydronephrosis or nephrolithiasis. 2. Numerous colonic diverticula without definite active inflammation. 3. Trace free pelvic fluid.   Electronically Signed   By: Jorje Guild M.D.   On: 07/03/2013 01:33      EKG Interpretation   Date/Time:  Monday July 03 2013 00:13:07 EDT Ventricular Rate:  75 PR Interval:  157 QRS Duration: 111 QT Interval:  405 QTC Calculation: 452 R Axis:   -69 Text Interpretation:  Sinus rhythm RSR' in V1 or V2, right VCD or RVH  Inferior infarct, old Baseline wander in lead(s) V5 V6 mild ST elevation  V2, overall similar previous Confirmed by ZAVITZ  MD, JOSHUA ([id number]) on  07/03/2013 12:25:34 AM      MDM   Final diagnoses:  Chest pain, unspecified chest pain type  Hyperkalemia  Anemia ARF  Patient presented after episode of abdominal pain left-sided however denies a bump at this time and no signs of discomfort with deep palpation of abdomen. Bedside ultrasound done to look at aorta in widest diameter 2.8 cm. Plan for screening blood work and urinalysis and will contact nursing home for more detail.  Patient with no symptoms while in ER. Potassium returned elevated repeat showed testing is 6. EKG no significant changes and no evidence of hyperkalemia and EKG. Discuss case with Dr. Terrence Dupont recommended he will see the patient later in the office and give a dose of Kayexalate. Troponin negative. Discuss case with nursing home who said she had brief episode of left lower chest/left upper abdominal pain that resolved. Patient had baseline mentally. Reviewed medications and hyperkalemia mild likely secondarily to renal deficiency.  Results and differential diagnosis were discussed with the patient/parent/guardian. Close follow up outpatient was discussed, comfortable with the plan.   Medications  aspirin chewable tablet 324 mg (324 mg Oral Not Given 07/03/13 0438)  sodium polystyrene (KAYEXALATE) 15 GM/60ML suspension 30 g (30 g Oral Given 07/03/13 0352)    Filed Vitals:   07/02/13 2259  BP: 118/77  Pulse: 78  Temp: 97.9 F (36.6 C)  TempSrc: Oral  Resp: 14  SpO2: 97%         Mariea Clonts, MD 07/03/13 [id number]

## 2013-07-02 NOTE — ED Notes (Signed)
Per [facility], pt from [facility], hx of advanced dementia. About 10-66min PTA, pt c/o LUQ abdominal pain. Pain worsen with palpation and movement. Pt now complaining of 0/10 pain. Denies CP, SOB, dizziness, N/V/D. Pt is appropriate per mentation baseline.

## 2013-07-02 NOTE — ED Notes (Addendum)
CG-4 result shown to [facility] for Dr. Reather Converse

## 2013-07-03 ENCOUNTER — Emergency Department (HOSPITAL_COMMUNITY): Payer: Medicare Other

## 2013-07-03 DIAGNOSIS — N179 Acute kidney failure, unspecified: Secondary | ICD-10-CM | POA: Diagnosis not present

## 2013-07-03 LAB — CBC WITH DIFFERENTIAL/PLATELET
BASOS ABS: 0 10*3/uL (ref 0.0–0.1)
Basophils Relative: 0 % (ref 0–1)
EOS PCT: 4 % (ref 0–5)
Eosinophils Absolute: 0.2 10*3/uL (ref 0.0–0.7)
HEMATOCRIT: 33.1 % — AB (ref 36.0–46.0)
HEMOGLOBIN: 10.3 g/dL — AB (ref 12.0–15.0)
LYMPHS ABS: 2.4 10*3/uL (ref 0.7–4.0)
Lymphocytes Relative: 46 % (ref 12–46)
MCH: 34 pg (ref 26.0–34.0)
MCHC: 31.1 g/dL (ref 30.0–36.0)
MCV: 109.2 fL — AB (ref 78.0–100.0)
MONO ABS: 0.6 10*3/uL (ref 0.1–1.0)
MONOS PCT: 11 % (ref 3–12)
Neutro Abs: 2 10*3/uL (ref 1.7–7.7)
Neutrophils Relative %: 39 % — ABNORMAL LOW (ref 43–77)
Platelets: 175 10*3/uL (ref 150–400)
RBC: 3.03 MIL/uL — ABNORMAL LOW (ref 3.87–5.11)
RDW: 14.9 % (ref 11.5–15.5)
WBC: 5.3 10*3/uL (ref 4.0–10.5)

## 2013-07-03 LAB — URINALYSIS, ROUTINE W REFLEX MICROSCOPIC
BILIRUBIN URINE: NEGATIVE
Glucose, UA: NEGATIVE mg/dL
Hgb urine dipstick: NEGATIVE
Ketones, ur: 15 mg/dL — AB
Leukocytes, UA: NEGATIVE
Nitrite: NEGATIVE
PH: 5 (ref 5.0–8.0)
PROTEIN: NEGATIVE mg/dL
Specific Gravity, Urine: 1.024 (ref 1.005–1.030)
Urobilinogen, UA: 1 mg/dL (ref 0.0–1.0)

## 2013-07-03 LAB — MAGNESIUM: MAGNESIUM: 2.5 mg/dL (ref 1.5–2.5)

## 2013-07-03 LAB — POTASSIUM: Potassium: 6.1 mEq/L — ABNORMAL HIGH (ref 3.7–5.3)

## 2013-07-03 LAB — TROPONIN I: Troponin I: <0.3 ng/mL (ref <0.30)

## 2013-07-03 MED ORDER — SODIUM POLYSTYRENE SULFONATE 15 GM/60ML PO SUSP
30.0000 g | Freq: Once | ORAL | Status: AC
  Administered 2013-07-03: 30 g via ORAL
  Filled 2013-07-03: qty 120

## 2013-07-03 MED ORDER — ASPIRIN 81 MG PO CHEW
324.0000 mg | CHEWABLE_TABLET | Freq: Once | ORAL | Status: DC
  Filled 2013-07-03: qty 4

## 2013-07-03 NOTE — ED Notes (Signed)
Per previous RN pt refused to take Asa.  Pt refuses ASA for this RN as well.

## 2013-07-03 NOTE — ED Notes (Signed)
[facility] called to transport pt 

## 2013-07-03 NOTE — ED Notes (Signed)
Pt refusing Aspiring.  Stating "i don't want any of it".

## 2013-07-03 NOTE — ED Notes (Signed)
[facility] here to transport pt to [facility].

## 2013-07-03 NOTE — Discharge Instructions (Signed)
See Dr Terrence Dupont later today in office.  If you were given medicines take as directed.  If you are on coumadin or contraceptives realize their levels and effectiveness is altered by many different medicines.  If you have any reaction (rash, tongues swelling, other) to the medicines stop taking and see a physician.   Please follow up as directed and return to the ER or see a physician for new or worsening symptoms.  Thank you. Filed Vitals:   07/03/13 0200 07/03/13 0230 07/03/13 0251 07/03/13 0300  BP:  128/69 131/66 128/64  Pulse: 61 74 69   Temp:      TempSrc:      Resp: 20 13 13 13   SpO2: 97% 95% 100%     Chest Pain (Nonspecific) It is often hard to give a specific diagnosis for the cause of chest pain. There is always a chance that your pain could be related to something serious, such as a heart attack or a blood clot in the lungs. You need to follow up with your health care provider for further evaluation. CAUSES   Heartburn.  Pneumonia or bronchitis.  Anxiety or stress.  Inflammation around your heart (pericarditis) or lung (pleuritis or pleurisy).  A blood clot in the lung.  A collapsed lung (pneumothorax). It can develop suddenly on its own (spontaneous pneumothorax) or from trauma to the chest.  Shingles infection (herpes zoster virus). The chest wall is composed of bones, muscles, and cartilage. Any of these can be the source of the pain.  The bones can be bruised by injury.  The muscles or cartilage can be strained by coughing or overwork.  The cartilage can be affected by inflammation and become sore (costochondritis). DIAGNOSIS  Lab tests or other studies may be needed to find the cause of your pain. Your health care provider may have you take a test called an ambulatory electrocardiogram (ECG). An ECG records your heartbeat patterns over a 24-hour period. You may also have other tests, such as:  Transthoracic echocardiogram (TTE). During echocardiography, sound  waves are used to evaluate how blood flows through your heart.  Transesophageal echocardiogram (TEE).  Cardiac monitoring. This allows your health care provider to monitor your heart rate and rhythm in real time.  Holter monitor. This is a portable device that records your heartbeat and can help diagnose heart arrhythmias. It allows your health care provider to track your heart activity for several days, if needed.  Stress tests by exercise or by giving medicine that makes the heart beat faster. TREATMENT   Treatment depends on what may be causing your chest pain. Treatment may include:  Acid blockers for heartburn.  Anti-inflammatory medicine.  Pain medicine for inflammatory conditions.  Antibiotics if an infection is present.  You may be advised to change lifestyle habits. This includes stopping smoking and avoiding alcohol, caffeine, and chocolate.  You may be advised to keep your head raised (elevated) when sleeping. This reduces the chance of acid going backward from your stomach into your esophagus. Most of the time, nonspecific chest pain will improve within 2-3 days with rest and mild pain medicine.  HOME CARE INSTRUCTIONS   If antibiotics were prescribed, take them as directed. Finish them even if you start to feel better.  For the next few days, avoid physical activities that bring on chest pain. Continue physical activities as directed.  Do not use any tobacco products, including cigarettes, chewing tobacco, or electronic cigarettes.  Avoid drinking alcohol.  Only take medicine  as directed by your health care provider.  Follow your health care provider's suggestions for further testing if your chest pain does not go away.  Keep any follow-up appointments you made. If you do not go to an appointment, you could develop lasting (chronic) problems with pain. If there is any problem keeping an appointment, call to reschedule. SEEK MEDICAL CARE IF:   Your chest pain  does not go away, even after treatment.  You have a rash with blisters on your chest.  You have a fever. SEEK IMMEDIATE MEDICAL CARE IF:   You have increased chest pain or pain that spreads to your arm, neck, jaw, back, or abdomen.  You have shortness of breath.  You have an increasing cough, or you cough up blood.  You have severe back or abdominal pain.  You feel nauseous or vomit.  You have severe weakness.  You faint.  You have chills. This is an emergency. Do not wait to see if the pain will go away. Get medical help at once. Call your local emergency services (911 in [place] Do not drive yourself to the hospital. MAKE SURE YOU:   Understand these instructions.  Will watch your condition.  Will get help right away if you are not doing well or get worse. Document Released: 10/01/2004 Document Revised: 12/27/2012 Document Reviewed: 07/28/2007 [facility] Patient Information 2015 [place], [place]. This information is not intended to replace advice given to you by your health care provider. Make sure you discuss any questions you have with your health care provider.

## 2013-07-03 NOTE — ED Notes (Signed)
Patient transported to CT 

## 2015-10-25 IMAGING — CR DG CHEST 2V
2 series · 2 of 2 positions shown · non-contrast
Comparison: 01/31/2012, 12/23/2009, and 02/25/2009 as well as
PET-CT dated 04/10/2008

CLINICAL DATA: Cough and shortness of breath. Known pulmonary
nodule.

EXAM:
CHEST  2 VIEW

[w chest lat]
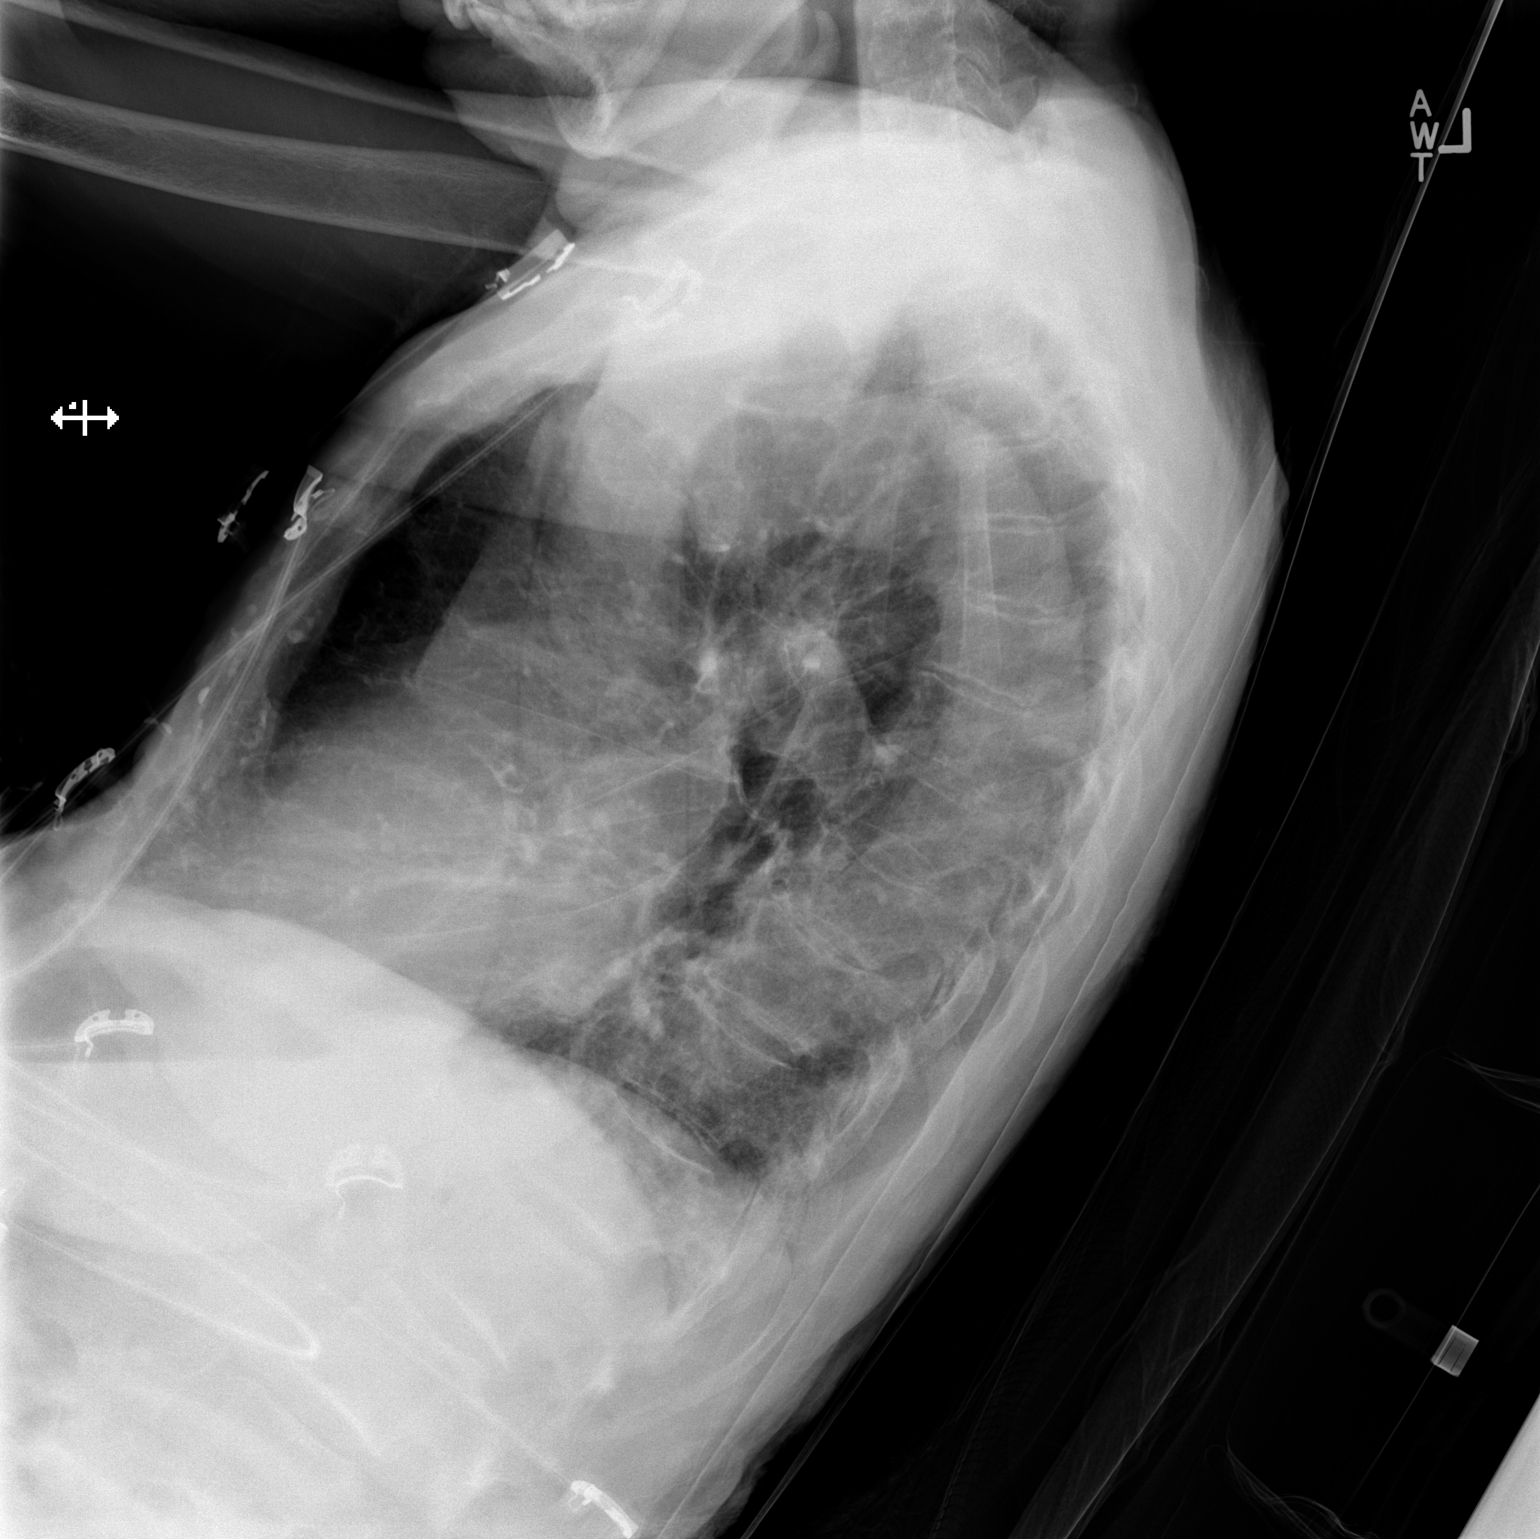

[x chest ap]
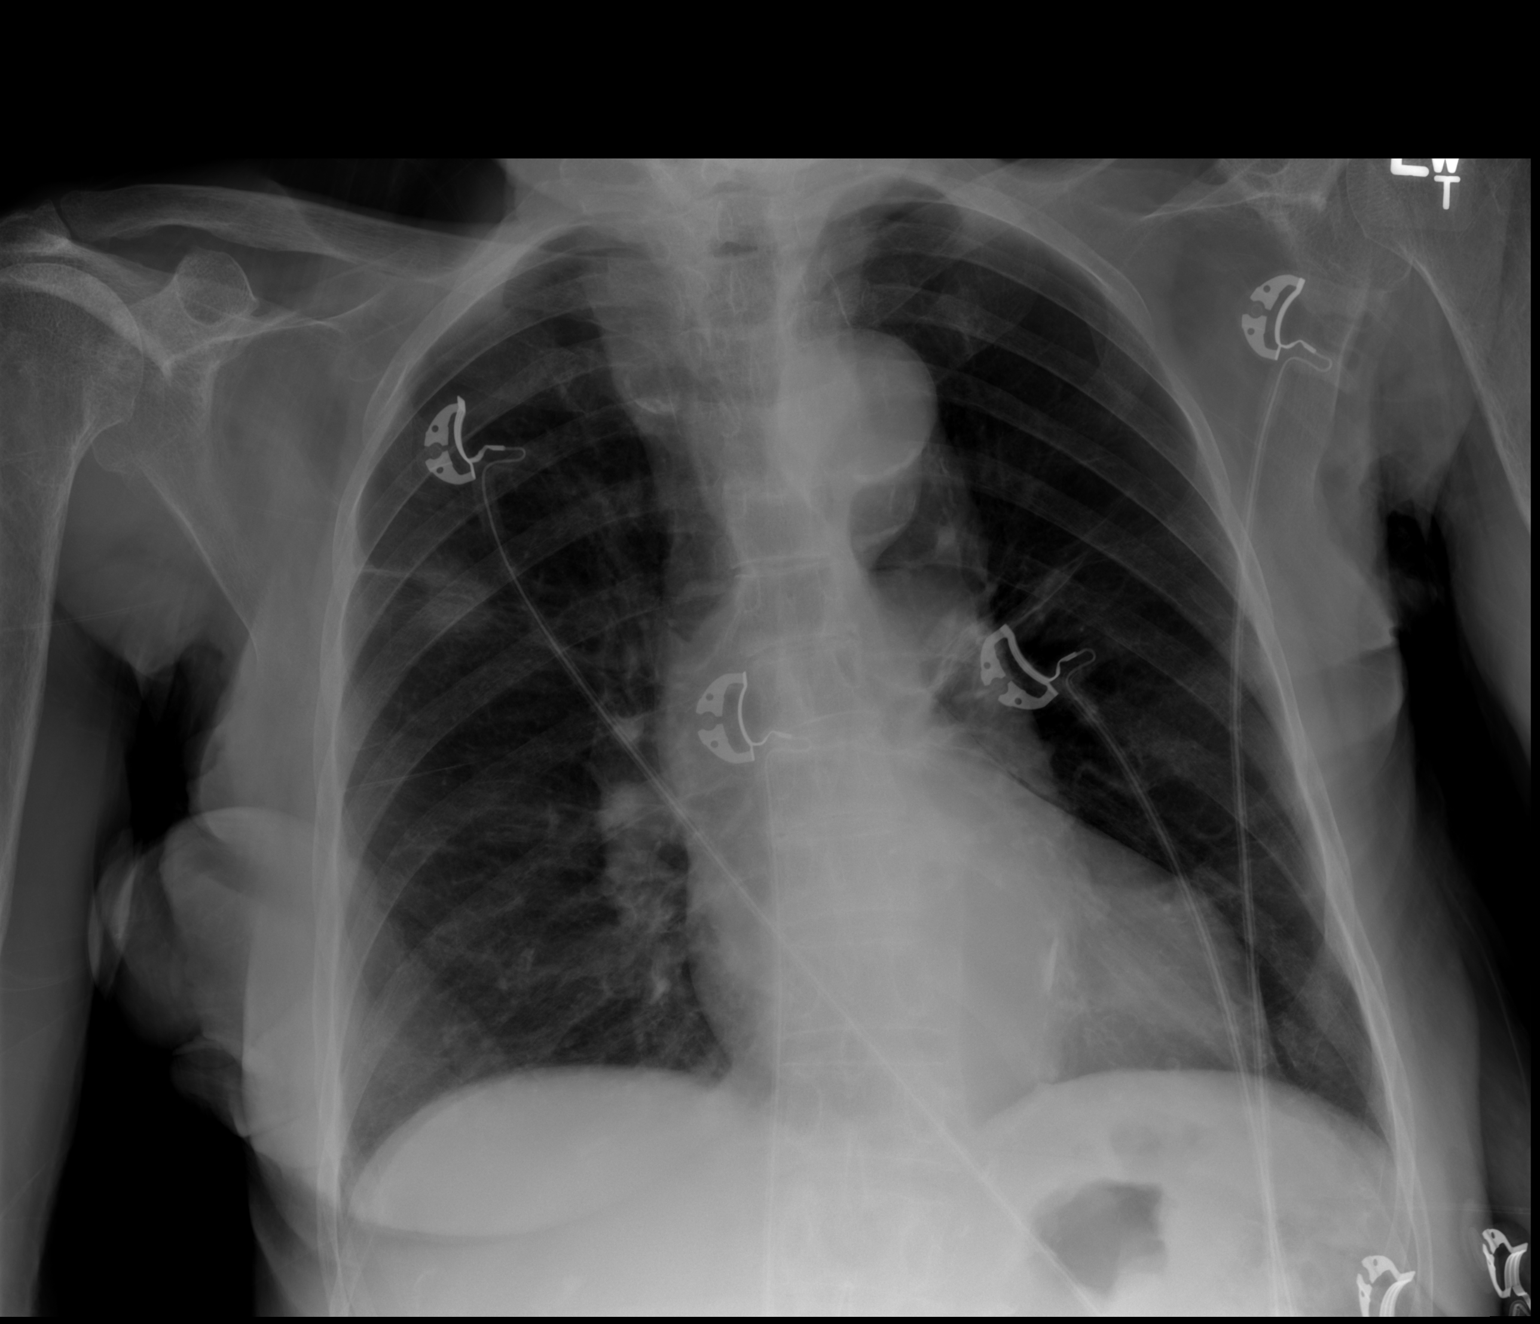

[2 of 2 positions shown; findings below may reference images not displayed]

FINDINGS: The heart size and pulmonary vascularity are normal. There are no
acute infiltrates or effusions. Ill-defined nodule in the right
upper lobe is unchanged since multiple prior exams. No acute osseous
abnormality.
IMPRESSION: No acute disease.  Stable pulmonary nodule.

## 2016-01-14 ENCOUNTER — Encounter (HOSPITAL_COMMUNITY): Payer: Self-pay | Admitting: *Deleted

## 2016-01-14 ENCOUNTER — Emergency Department (HOSPITAL_COMMUNITY)
Admission: EM | Admit: 2016-01-14 | Discharge: 2016-01-14 | Disposition: A | Discharge status: Home / Self Care | Payer: Medicare Other | Attending: Emergency Medicine | Admitting: Emergency Medicine

## 2016-01-14 ENCOUNTER — Emergency Department (HOSPITAL_COMMUNITY): Payer: Medicare Other

## 2016-01-14 DIAGNOSIS — R05 Cough: Secondary | ICD-10-CM | POA: Diagnosis present

## 2016-01-14 DIAGNOSIS — Z96641 Presence of right artificial hip joint: Secondary | ICD-10-CM | POA: Insufficient documentation

## 2016-01-14 DIAGNOSIS — J441 Chronic obstructive pulmonary disease with (acute) exacerbation: Secondary | ICD-10-CM | POA: Insufficient documentation

## 2016-01-14 DIAGNOSIS — I2510 Atherosclerotic heart disease of native coronary artery without angina pectoris: Secondary | ICD-10-CM | POA: Insufficient documentation

## 2016-01-14 DIAGNOSIS — Z79899 Other long term (current) drug therapy: Secondary | ICD-10-CM | POA: Diagnosis not present

## 2016-01-14 DIAGNOSIS — F17210 Nicotine dependence, cigarettes, uncomplicated: Secondary | ICD-10-CM | POA: Diagnosis not present

## 2016-01-14 DIAGNOSIS — I10 Essential (primary) hypertension: Secondary | ICD-10-CM | POA: Insufficient documentation

## 2016-01-14 DIAGNOSIS — Z8673 Personal history of transient ischemic attack (TIA), and cerebral infarction without residual deficits: Secondary | ICD-10-CM | POA: Insufficient documentation

## 2016-01-14 LAB — BASIC METABOLIC PANEL
Anion gap: 9 (ref 5–15)
BUN: 35 mg/dL — ABNORMAL HIGH (ref 6–20)
CALCIUM: 9.1 mg/dL (ref 8.9–10.3)
CO2: 26 mmol/L (ref 22–32)
Chloride: 108 mmol/L (ref 101–111)
Creatinine, Ser: 1.25 mg/dL — ABNORMAL HIGH (ref 0.44–1.00)
GFR calc Af Amer: 42 mL/min — ABNORMAL LOW (ref >60)
GFR, EST NON AFRICAN AMERICAN: 36 mL/min — AB (ref >60)
GLUCOSE: 96 mg/dL (ref 65–99)
Potassium: 4 mmol/L (ref 3.5–5.1)
Sodium: 143 mmol/L (ref 135–145)

## 2016-01-14 LAB — CBC
HEMATOCRIT: 31.7 % — AB (ref 36.0–46.0)
Hemoglobin: 9.8 g/dL — ABNORMAL LOW (ref 12.0–15.0)
MCH: 31.3 pg (ref 26.0–34.0)
MCHC: 30.9 g/dL (ref 30.0–36.0)
MCV: 101.3 fL — ABNORMAL HIGH (ref 78.0–100.0)
Platelets: 212 10*3/uL (ref 150–400)
RBC: 3.13 MIL/uL — ABNORMAL LOW (ref 3.87–5.11)
RDW: 13 % (ref 11.5–15.5)
WBC: 4.9 10*3/uL (ref 4.0–10.5)

## 2016-01-14 LAB — I-STAT TROPONIN, ED: TROPONIN I, POC: 0 ng/mL (ref 0.00–0.08)

## 2016-01-14 MED ORDER — METHYLPREDNISOLONE SODIUM SUCC 125 MG IJ SOLR
125.0000 mg | Freq: Once | INTRAMUSCULAR | Status: DC
  Filled 2016-01-14: qty 2

## 2016-01-14 MED ORDER — ALBUTEROL SULFATE (2.5 MG/3ML) 0.083% IN NEBU: 5.0000 mg | INHALATION_SOLUTION | Freq: Once | RESPIRATORY_TRACT | Status: DC

## 2016-01-14 MED ORDER — ALBUTEROL (5 MG/ML) CONTINUOUS INHALATION SOLN
10.0000 mg/h | INHALATION_SOLUTION | Freq: Once | RESPIRATORY_TRACT | Status: AC
  Administered 2016-01-14: 10 mg/h via RESPIRATORY_TRACT
  Filled 2016-01-14: qty 20

## 2016-01-14 MED ORDER — ALBUTEROL SULFATE (2.5 MG/3ML) 0.083% IN NEBU: 3.0000 mL | INHALATION_SOLUTION | RESPIRATORY_TRACT | 0 refills | Status: DC | PRN

## 2016-01-14 MED ORDER — SODIUM CHLORIDE 0.9 % IV BOLUS (SEPSIS): 1000.0000 mL | Freq: Once | INTRAVENOUS | Status: DC

## 2016-01-14 MED ORDER — PREDNISONE 20 MG PO TABS: ORAL_TABLET | ORAL | 0 refills | Status: DC

## 2016-01-14 MED ORDER — IPRATROPIUM BROMIDE 0.02 % IN SOLN
1.0000 mg | Freq: Once | RESPIRATORY_TRACT | Status: AC
  Administered 2016-01-14: 1 mg via RESPIRATORY_TRACT
  Filled 2016-01-14: qty 5

## 2016-01-14 MED ORDER — SODIUM CHLORIDE 0.9 % IV SOLN: INTRAVENOUS | Status: DC

## 2016-01-14 NOTE — ED Notes (Signed)
Patient unhooked herself from EKG leads, BP cuff and SPO2 monitor and gown. Redressed patient.

## 2016-01-14 NOTE — ED Notes (Signed)
Bed: IY64 Expected date:  Expected time:  Means of arrival:  Comments: EMS/92/resp distress

## 2016-01-14 NOTE — ED Notes (Signed)
Patient took off blood pressure cuff and SPO2, and is sleeping.

## 2016-01-14 NOTE — ED Provider Notes (Signed)
[place] DEPT Provider Note   CSN: [id number] Arrival date & time: 01/14/16  1718     History   Chief Complaint Chief Complaint  Patient presents with  . Respiratory Distress    HPI Annette Cantrell is a 83 y.o. female.   Shortness of Breath  This is a new problem. The average episode lasts 1 day. The problem occurs rarely.The problem has been gradually worsening. Associated symptoms include cough and sputum production. Pertinent negatives include no fever, no orthopnea and no chest pain.    Past Medical History:  Diagnosis Date  . CAD (coronary artery disease)   . Chronic systolic heart failure ([place])   . COPD 03/30/2008   Qualifier: Diagnosis of  By: [name] MD, [name]   . Dementia   . Heart disease, unspecified   . Herpes zoster 01/30/2012  . High cholesterol   . History of positive PPD, untreated   . Infected sebaceous cyst 01/25/2012  . LUNG NODULE 03/30/2008   Qualifier: Diagnosis of  By: [name] MD, [name]   . Stroke [facility])     Patient Active Problem List   Diagnosis Date Noted  . Palliative care encounter 04/04/2013  . Dysphagia, unspecified(787.20) 04/04/2013  . Weakness generalized 04/04/2013  . Malnutrition of moderate degree ([place]) 04/03/2013  . Thrombocytopenia, unspecified 04/01/2013  . Protein-calorie malnutrition, severe ([place]) 04/01/2013  . COPD (chronic obstructive pulmonary disease) ([place]) 03/31/2013  . Lactic acidosis 03/31/2013  . Chest pain 03/31/2013  . Acute respiratory failure with hypoxia ([place]) 11/17/2012  . COPD exacerbation ([place]) 11/17/2012  . Acute renal failure ([place]) 11/17/2012  . HTN (hypertension) 11/17/2012  . Anemia 11/17/2012  . Depression 11/17/2012  . Dementia   . HYPERLIPIDEMIA 04/20/2008    Past Surgical History:  Procedure Laterality Date  . HIP ARTHROPLASTY  01/31/2012   Procedure: ARTHROPLASTY BIPOLAR HIP;  Surgeon: Rozanna Box, MD;  Location: [place];  Service: Orthopedics;  Laterality: Right;    OB History      No data available       Home Medications    Prior to Admission medications   Medication Sig Start Date End Date Taking? Authorizing Provider  acetaminophen (TYLENOL) 325 MG tablet Take 650 mg by mouth every morning.   Yes Historical Provider, MD  acetaminophen (TYLENOL) 500 MG tablet Take 500 mg by mouth every 4 (four) hours as needed for mild pain or headache.   Yes Historical Provider, MD  alum & mag hydroxide-simeth (MINTOX) 200-200-20 MG/5ML suspension Take 30 mLs by mouth every 6 (six) hours as needed for indigestion or heartburn.   Yes Historical Provider, MD  divalproex (DEPAKOTE SPRINKLE) 125 MG capsule Take 125 mg by mouth 4 (four) times daily.    Yes Historical Provider, MD  guaifenesin (ROBITUSSIN) 100 MG/5ML syrup Take 200 mg by mouth 4 (four) times daily as needed for cough.   Yes Historical Provider, MD  hydrocortisone 2.5 % ointment Apply 1 application topically every 6 (six) hours as needed (pain).   Yes Historical Provider, MD  ipratropium-albuterol (DUONEB) 0.5-2.5 (3) MG/3ML SOLN Take 3 mLs by nebulization every 6 (six) hours as needed (sob and cough).   Yes Historical Provider, MD  loperamide (IMODIUM) 2 MG capsule Take 2 mg by mouth as needed for diarrhea or loose stools.   Yes Historical Provider, MD  LORazepam (ATIVAN) 0.5 MG tablet Take 0.5 mg by mouth every 4 (four) hours as needed for anxiety.   Yes Historical Provider, MD  magnesium hydroxide (MILK OF  MAGNESIA) 400 MG/5ML suspension Take 30 mLs by mouth daily as needed for mild constipation.   Yes Historical Provider, MD  mirtazapine (REMERON) 7.5 MG tablet Take 7.5 mg by mouth at bedtime.   Yes Historical Provider, MD  Nutritional Supplements (NUTRITIONAL SUPPLEMENT PO) Take 1 Container by mouth 3 (three) times daily with meals. [employer]   Yes Historical Provider, MD  OLANZapine zydis (ZYPREXA) 5 MG disintegrating tablet Take 1 tablet (5 mg total) by mouth at bedtime. 04/05/13  Yes Velvet Bathe, MD  sertraline  (ZOLOFT) 50 MG tablet Take 50 mg by mouth daily.   Yes Historical Provider, MD  Vitamin D, Ergocalciferol, (DRISDOL) 50000 units CAPS capsule Take 50,000 Units by mouth every 30 (thirty) days.   Yes Historical Provider, MD  albuterol (PROVENTIL) (2.5 MG/3ML) 0.083% nebulizer solution Inhale 3 mLs into the lungs every 4 (four) hours as needed for wheezing or shortness of breath. 01/14/16   Merrily Pew, MD  predniSONE (DELTASONE) 20 MG tablet 3 tabs po day one, then 2 po daily x 4 days 01/14/16   Merrily Pew, MD    Family History Family History  Problem Relation Age of Onset  . Stroke Mother     Social History Social History  Substance Use Topics  . Smoking status: Current Every Day Smoker    Packs/day: 0.20    Types: Cigarettes  . Smokeless tobacco: Never Used  . Alcohol use No     Allergies   Patient has no known allergies.   Review of Systems Review of Systems  Unable to perform ROS: Dementia  Constitutional: Negative for fever.  Respiratory: Positive for cough, sputum production and shortness of breath.   Cardiovascular: Negative for chest pain and orthopnea.     Physical Exam Updated Vital Signs BP (!) 194/112 (BP Location: Left Arm)   Pulse 94   Temp 97.9 F (36.6 C)   Resp 26   SpO2 95%   Physical Exam  Constitutional: She appears well-developed and well-nourished.  HENT:  Head: Normocephalic and atraumatic.  Eyes: Conjunctivae and EOM are normal.  Neck: Normal range of motion.  Cardiovascular: Normal rate and regular rhythm.   Pulmonary/Chest: Effort normal. No stridor. Tachypnea noted. No respiratory distress. She has decreased breath sounds. She has wheezes.  Abdominal: Soft. She exhibits no distension.  Musculoskeletal: Normal range of motion. She exhibits no edema or deformity.  Neurological: She is alert.  Skin: Skin is warm and dry.  Nursing note and vitals reviewed.    ED Treatments / Results  Labs (all labs ordered are listed, but only  abnormal results are displayed) Labs Reviewed  BASIC METABOLIC PANEL - Abnormal; Notable for the following:       Result Value   BUN 35 (*)    Creatinine, Ser 1.25 (*)    GFR calc non Af Amer 36 (*)    GFR calc Af Amer 42 (*)    All other components within normal limits  CBC - Abnormal; Notable for the following:    RBC 3.13 (*)    Hemoglobin 9.8 (*)    HCT 31.7 (*)    MCV 101.3 (*)    All other components within normal limits  CBC  BASIC METABOLIC PANEL  I-STAT TROPOININ, ED    EKG  EKG Interpretation  Date/Time:  Tuesday January 14 2016 17:39:59 EST Ventricular Rate:  76 PR Interval:    QRS Duration: 130 QT Interval:  427 QTC Calculation: 481 R Axis:   -26 Text  Interpretation:  Sinus arrhythmia Right bundle branch block Probable anterolateral infarct, old RBBB likely new Confirmed by [facility] MD, Fronnie Urton [phone]) on 01/14/2016 8:33:35 PM       Radiology Dg Chest 2 View  Result Date: 01/14/2016 CLINICAL DATA:  83 y/o F; respiratory distress and concern for pneumonia. EXAM: CHEST  2 VIEW COMPARISON:  03/30/2013 chest radiograph. 03/23/2008 CT of the chest. FINDINGS: Right upper lobe nodule is grossly stable in comparison with prior radiographs and present on prior CT of chest. No focal consolidation of the lungs. No pleural effusion or pneumothorax. Bones are unremarkable. Patient is rotated. Cardiac silhouette is grossly stable. Increased prominence of the aortic knob may be due to projectional versus interval development of aortic aneurysm. IMPRESSION: No acute pulmonary process. Stable right upper lobe nodule. Increased prominence of the aortic knob may be projectional or may represent aortic aneurysm. This can be further characterized with CT if clinically indicated. Electronically Signed   By: Kristine Garbe M.D.   On: 01/14/2016 19:07    Procedures Procedures (including critical care time)  Medications Ordered in ED Medications  albuterol (PROVENTIL) (2.5 MG/3ML)  0.083% nebulizer solution 5 mg (5 mg Nebulization Not Given 01/14/16 1801)  sodium chloride 0.9 % bolus 1,000 mL (not administered)    And  0.9 %  sodium chloride infusion (not administered)  methylPREDNISolone sodium succinate (SOLU-MEDROL) 125 mg/2 mL injection 125 mg (not administered)  albuterol (PROVENTIL,VENTOLIN) solution continuous neb (10 mg/hr Nebulization Given 01/14/16 1806)  ipratropium (ATROVENT) nebulizer solution 1 mg (1 mg Nebulization Given 01/14/16 1806)     Initial Impression / Assessment and Plan / ED Course  I have reviewed the triage vital signs and the nursing notes.  Pertinent labs & imaging results that were available during my care of the patient were reviewed by me and considered in my medical decision making (see chart for details).  Clinical Course      83 year old female who with likely COPD exacerbation. Patient has regions and oriented still slight wheezing and tachypnea on arrival however refused treatment. She was not hypoxic or in any distress of patient observed for 2 hours and discussed the case with her next of kin, her granddaughter Algis Greenhouse, [phone], who stated that if the patient was in no immediate distress she did not want to force any treatments for her. Patient was observed for another hour to hour and a half the patient continuously not hypoxic. She is comfortable in a respiratory rate. She still refusing care. Patient will be discharged for facility for PRN breathing treatments.    Final diagnoses:  COPD exacerbation ([place])    New Prescriptions New Prescriptions   PREDNISONE (DELTASONE) 20 MG TABLET    3 tabs po day one, then 2 po daily x 4 days     Merrily Pew, MD 01/14/16 2033

## 2016-01-14 NOTE — ED Notes (Signed)
PT VERY CONFUSED AND REFUSING TREATMENT. DR. Dayna Barker MADE AWARE.

## 2016-01-14 NOTE — ED Triage Notes (Signed)
Per EMS, pt from [facility], reports resp distress, wheezing with labored breathing.  Pt has hx of dementia and alzheimer's.  96% RA.

## 2017-02-08 ENCOUNTER — Encounter (HOSPITAL_COMMUNITY): Payer: Self-pay | Admitting: Emergency Medicine

## 2017-02-08 ENCOUNTER — Emergency Department (HOSPITAL_COMMUNITY)
Admission: EM | Admit: 2017-02-08 | Discharge: 2017-02-09 | Disposition: A | Discharge status: Home / Self Care | Payer: Medicare Other | Attending: Emergency Medicine | Admitting: Emergency Medicine

## 2017-02-08 DIAGNOSIS — F0390 Unspecified dementia without behavioral disturbance: Secondary | ICD-10-CM | POA: Diagnosis not present

## 2017-02-08 DIAGNOSIS — W050XXA Fall from non-moving wheelchair, initial encounter: Secondary | ICD-10-CM | POA: Insufficient documentation

## 2017-02-08 DIAGNOSIS — S0093XA Contusion of unspecified part of head, initial encounter: Secondary | ICD-10-CM | POA: Diagnosis not present

## 2017-02-08 DIAGNOSIS — I2510 Atherosclerotic heart disease of native coronary artery without angina pectoris: Secondary | ICD-10-CM | POA: Insufficient documentation

## 2017-02-08 DIAGNOSIS — F17210 Nicotine dependence, cigarettes, uncomplicated: Secondary | ICD-10-CM | POA: Insufficient documentation

## 2017-02-08 DIAGNOSIS — I5022 Chronic systolic (congestive) heart failure: Secondary | ICD-10-CM | POA: Diagnosis not present

## 2017-02-08 DIAGNOSIS — Y999 Unspecified external cause status: Secondary | ICD-10-CM | POA: Diagnosis not present

## 2017-02-08 DIAGNOSIS — Y9389 Activity, other specified: Secondary | ICD-10-CM | POA: Diagnosis not present

## 2017-02-08 DIAGNOSIS — J449 Chronic obstructive pulmonary disease, unspecified: Secondary | ICD-10-CM | POA: Insufficient documentation

## 2017-02-08 DIAGNOSIS — Z8673 Personal history of transient ischemic attack (TIA), and cerebral infarction without residual deficits: Secondary | ICD-10-CM | POA: Diagnosis not present

## 2017-02-08 DIAGNOSIS — I110 Hypertensive heart disease with heart failure: Secondary | ICD-10-CM | POA: Diagnosis not present

## 2017-02-08 DIAGNOSIS — Y92129 Unspecified place in nursing home as the place of occurrence of the external cause: Secondary | ICD-10-CM | POA: Diagnosis not present

## 2017-02-08 DIAGNOSIS — S098XXA Other specified injuries of head, initial encounter: Secondary | ICD-10-CM | POA: Diagnosis present

## 2017-02-08 NOTE — ED Notes (Signed)
Bed: WA17 Expected date:  Expected time:  Means of arrival:  Comments: Rm 6
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
## 2017-02-09 ENCOUNTER — Emergency Department (HOSPITAL_COMMUNITY): Payer: Medicare Other

## 2017-02-09 DIAGNOSIS — S0093XA Contusion of unspecified part of head, initial encounter: Secondary | ICD-10-CM | POA: Diagnosis not present

## 2017-02-09 NOTE — ED Notes (Signed)
Patient transported to CT 
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
## 2017-02-09 NOTE — ED Notes (Signed)
[facility] called for transport.  
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
## 2017-04-26 ENCOUNTER — Inpatient Hospital Stay (HOSPITAL_COMMUNITY)
Admission: EM | Admit: 2017-04-26 | Discharge: 2017-04-29 | DRG: 871 | Disposition: A | Discharge status: Skilled Nursing Facility | Payer: Medicare Other | Attending: Internal Medicine | Admitting: Internal Medicine

## 2017-04-26 ENCOUNTER — Inpatient Hospital Stay (HOSPITAL_COMMUNITY): Payer: Medicare Other

## 2017-04-26 ENCOUNTER — Emergency Department (HOSPITAL_COMMUNITY): Payer: Medicare Other

## 2017-04-26 ENCOUNTER — Encounter (HOSPITAL_COMMUNITY): Payer: Self-pay | Admitting: *Deleted

## 2017-04-26 DIAGNOSIS — F17210 Nicotine dependence, cigarettes, uncomplicated: Secondary | ICD-10-CM | POA: Diagnosis present

## 2017-04-26 DIAGNOSIS — I5022 Chronic systolic (congestive) heart failure: Secondary | ICD-10-CM | POA: Diagnosis present

## 2017-04-26 DIAGNOSIS — A419 Sepsis, unspecified organism: Secondary | ICD-10-CM | POA: Diagnosis present

## 2017-04-26 DIAGNOSIS — Z8673 Personal history of transient ischemic attack (TIA), and cerebral infarction without residual deficits: Secondary | ICD-10-CM | POA: Diagnosis not present

## 2017-04-26 DIAGNOSIS — F329 Major depressive disorder, single episode, unspecified: Secondary | ICD-10-CM | POA: Diagnosis present

## 2017-04-26 DIAGNOSIS — N179 Acute kidney failure, unspecified: Secondary | ICD-10-CM | POA: Diagnosis present

## 2017-04-26 DIAGNOSIS — Z96641 Presence of right artificial hip joint: Secondary | ICD-10-CM | POA: Diagnosis present

## 2017-04-26 DIAGNOSIS — J690 Pneumonitis due to inhalation of food and vomit: Secondary | ICD-10-CM | POA: Diagnosis present

## 2017-04-26 DIAGNOSIS — J441 Chronic obstructive pulmonary disease with (acute) exacerbation: Secondary | ICD-10-CM | POA: Diagnosis present

## 2017-04-26 DIAGNOSIS — E43 Unspecified severe protein-calorie malnutrition: Secondary | ICD-10-CM | POA: Diagnosis present

## 2017-04-26 DIAGNOSIS — D539 Nutritional anemia, unspecified: Secondary | ICD-10-CM | POA: Diagnosis present

## 2017-04-26 DIAGNOSIS — D649 Anemia, unspecified: Secondary | ICD-10-CM | POA: Diagnosis present

## 2017-04-26 DIAGNOSIS — Z720 Tobacco use: Secondary | ICD-10-CM | POA: Diagnosis present

## 2017-04-26 DIAGNOSIS — G9341 Metabolic encephalopathy: Secondary | ICD-10-CM | POA: Diagnosis present

## 2017-04-26 DIAGNOSIS — J449 Chronic obstructive pulmonary disease, unspecified: Secondary | ICD-10-CM | POA: Diagnosis not present

## 2017-04-26 DIAGNOSIS — Z79899 Other long term (current) drug therapy: Secondary | ICD-10-CM | POA: Diagnosis not present

## 2017-04-26 DIAGNOSIS — I2510 Atherosclerotic heart disease of native coronary artery without angina pectoris: Secondary | ICD-10-CM | POA: Diagnosis present

## 2017-04-26 DIAGNOSIS — I10 Essential (primary) hypertension: Secondary | ICD-10-CM | POA: Diagnosis not present

## 2017-04-26 DIAGNOSIS — E860 Dehydration: Secondary | ICD-10-CM | POA: Diagnosis present

## 2017-04-26 DIAGNOSIS — I4510 Unspecified right bundle-branch block: Secondary | ICD-10-CM | POA: Diagnosis present

## 2017-04-26 DIAGNOSIS — E785 Hyperlipidemia, unspecified: Secondary | ICD-10-CM | POA: Diagnosis present

## 2017-04-26 DIAGNOSIS — R112 Nausea with vomiting, unspecified: Secondary | ICD-10-CM | POA: Diagnosis present

## 2017-04-26 DIAGNOSIS — Z66 Do not resuscitate: Secondary | ICD-10-CM | POA: Diagnosis present

## 2017-04-26 DIAGNOSIS — Z6820 Body mass index (BMI) 20.0-20.9, adult: Secondary | ICD-10-CM

## 2017-04-26 DIAGNOSIS — F32A Depression, unspecified: Secondary | ICD-10-CM | POA: Diagnosis present

## 2017-04-26 DIAGNOSIS — F419 Anxiety disorder, unspecified: Secondary | ICD-10-CM | POA: Diagnosis present

## 2017-04-26 DIAGNOSIS — F0280 Dementia in other diseases classified elsewhere without behavioral disturbance: Secondary | ICD-10-CM | POA: Diagnosis present

## 2017-04-26 DIAGNOSIS — I130 Hypertensive heart and chronic kidney disease with heart failure and stage 1 through stage 4 chronic kidney disease, or unspecified chronic kidney disease: Secondary | ICD-10-CM | POA: Diagnosis present

## 2017-04-26 DIAGNOSIS — N183 Chronic kidney disease, stage 3 (moderate): Secondary | ICD-10-CM | POA: Diagnosis present

## 2017-04-26 DIAGNOSIS — J189 Pneumonia, unspecified organism: Secondary | ICD-10-CM

## 2017-04-26 DIAGNOSIS — M549 Dorsalgia, unspecified: Secondary | ICD-10-CM

## 2017-04-26 LAB — URINALYSIS, ROUTINE W REFLEX MICROSCOPIC
Bacteria, UA: NONE SEEN
Bilirubin Urine: NEGATIVE
Glucose, UA: NEGATIVE mg/dL
Hgb urine dipstick: NEGATIVE
Ketones, ur: NEGATIVE mg/dL
Leukocytes, UA: NEGATIVE
Nitrite: NEGATIVE
Protein, ur: 30 mg/dL — AB
Specific Gravity, Urine: 1.013 (ref 1.005–1.030)
pH: 6 (ref 5.0–8.0)

## 2017-04-26 LAB — COMPREHENSIVE METABOLIC PANEL WITH GFR
ALT: 13 U/L — ABNORMAL LOW (ref 14–54)
AST: 28 U/L (ref 15–41)
Albumin: 3.3 g/dL — ABNORMAL LOW (ref 3.5–5.0)
Alkaline Phosphatase: 96 U/L (ref 38–126)
Anion gap: 16 — ABNORMAL HIGH (ref 5–15)
BUN: 49 mg/dL — ABNORMAL HIGH (ref 6–20)
CO2: 23 mmol/L (ref 22–32)
Calcium: 9.5 mg/dL (ref 8.9–10.3)
Chloride: 108 mmol/L (ref 101–111)
Creatinine, Ser: 1.69 mg/dL — ABNORMAL HIGH (ref 0.44–1.00)
GFR calc Af Amer: 29 mL/min — ABNORMAL LOW
GFR calc non Af Amer: 25 mL/min — ABNORMAL LOW
Glucose, Bld: 189 mg/dL — ABNORMAL HIGH (ref 65–99)
Potassium: 4.4 mmol/L (ref 3.5–5.1)
Sodium: 147 mmol/L — ABNORMAL HIGH (ref 135–145)
Total Bilirubin: 0.6 mg/dL (ref 0.3–1.2)
Total Protein: 7.9 g/dL (ref 6.5–8.1)

## 2017-04-26 LAB — I-STAT TROPONIN, ED: Troponin i, poc: 0.01 ng/mL (ref 0.00–0.08)

## 2017-04-26 LAB — CBC WITH DIFFERENTIAL/PLATELET
Basophils Absolute: 0 10*3/uL (ref 0.0–0.1)
Basophils Relative: 0 %
Eosinophils Absolute: 0 10*3/uL (ref 0.0–0.7)
Eosinophils Relative: 0 %
HEMATOCRIT: 30.2 % — AB (ref 36.0–46.0)
HEMOGLOBIN: 9.4 g/dL — AB (ref 12.0–15.0)
LYMPHS ABS: 0.6 10*3/uL — AB (ref 0.7–4.0)
LYMPHS PCT: 5 %
MCH: 32.4 pg (ref 26.0–34.0)
MCHC: 31.1 g/dL (ref 30.0–36.0)
MCV: 104.1 fL — AB (ref 78.0–100.0)
MONOS PCT: 11 %
Monocytes Absolute: 1.3 10*3/uL — ABNORMAL HIGH (ref 0.1–1.0)
NEUTROS ABS: 9.7 10*3/uL — AB (ref 1.7–7.7)
NEUTROS PCT: 84 %
Platelets: 243 10*3/uL (ref 150–400)
RBC: 2.9 MIL/uL — ABNORMAL LOW (ref 3.87–5.11)
RDW: 12.2 % (ref 11.5–15.5)
WBC: 11.5 10*3/uL — ABNORMAL HIGH (ref 4.0–10.5)

## 2017-04-26 LAB — I-STAT CG4 LACTIC ACID, ED: Lactic Acid, Venous: 3.28 mmol/L (ref 0.5–1.9)

## 2017-04-26 LAB — PROCALCITONIN: Procalcitonin: 0.79 ng/mL

## 2017-04-26 LAB — LIPASE, BLOOD: Lipase: 44 U/L (ref 11–51)

## 2017-04-26 LAB — BRAIN NATRIURETIC PEPTIDE: B NATRIURETIC PEPTIDE 5: 711.3 pg/mL — AB (ref 0.0–100.0)

## 2017-04-26 MED ORDER — IOPAMIDOL (ISOVUE-300) INJECTION 61%
INTRAVENOUS | Status: AC
  Filled 2017-04-26: qty 30

## 2017-04-26 MED ORDER — ONDANSETRON HCL 4 MG/2ML IJ SOLN
4.0000 mg | Freq: Once | INTRAMUSCULAR | Status: AC
  Administered 2017-04-26: 4 mg via INTRAVENOUS
  Filled 2017-04-26: qty 2

## 2017-04-26 MED ORDER — MIRTAZAPINE 15 MG PO TABS
7.5000 mg | ORAL_TABLET | Freq: Every day | ORAL | Status: DC
  Administered 2017-04-27 – 2017-04-28 (×3): 7.5 mg via ORAL
  Filled 2017-04-26 (×4): qty 1

## 2017-04-26 MED ORDER — VANCOMYCIN HCL IN DEXTROSE 750-5 MG/150ML-% IV SOLN
750.0000 mg | Freq: Once | INTRAVENOUS | Status: DC
  Administered 2017-04-26: 750 mg via INTRAVENOUS
  Filled 2017-04-26: qty 150

## 2017-04-26 MED ORDER — HYDRALAZINE HCL 20 MG/ML IJ SOLN
5.0000 mg | INTRAMUSCULAR | Status: DC | PRN
  Administered 2017-04-29: 5 mg via INTRAVENOUS
  Filled 2017-04-26 (×2): qty 1

## 2017-04-26 MED ORDER — MAGNESIUM HYDROXIDE 400 MG/5ML PO SUSP: 30.0000 mL | Freq: Every day | ORAL | Status: DC | PRN

## 2017-04-26 MED ORDER — SODIUM CHLORIDE 0.9 % IV SOLN: 2.0000 g | Freq: Once | INTRAVENOUS | Status: DC

## 2017-04-26 MED ORDER — IPRATROPIUM-ALBUTEROL 0.5-2.5 (3) MG/3ML IN SOLN
3.0000 mL | RESPIRATORY_TRACT | Status: DC
  Filled 2017-04-26: qty 3

## 2017-04-26 MED ORDER — HYDROCORTISONE 2.5 % EX OINT: 1.0000 "application " | TOPICAL_OINTMENT | Freq: Four times a day (QID) | CUTANEOUS | Status: DC | PRN

## 2017-04-26 MED ORDER — DIVALPROEX SODIUM 125 MG PO CSDR
125.0000 mg | DELAYED_RELEASE_CAPSULE | Freq: Four times a day (QID) | ORAL | Status: DC
  Administered 2017-04-26 – 2017-04-29 (×12): 125 mg via ORAL
  Filled 2017-04-26 (×13): qty 1

## 2017-04-26 MED ORDER — SODIUM CHLORIDE 0.9 % IV SOLN
INTRAVENOUS | Status: DC
  Administered 2017-04-26 – 2017-04-27 (×2): via INTRAVENOUS

## 2017-04-26 MED ORDER — HYDROXYZINE HCL 10 MG PO TABS: 10.0000 mg | ORAL_TABLET | Freq: Three times a day (TID) | ORAL | Status: DC | PRN

## 2017-04-26 MED ORDER — VANCOMYCIN HCL 500 MG IV SOLR
500.0000 mg | Freq: Once | INTRAVENOUS | Status: DC
  Filled 2017-04-26: qty 500

## 2017-04-26 MED ORDER — SODIUM CHLORIDE 0.9 % IV BOLUS
1000.0000 mL | Freq: Once | INTRAVENOUS | Status: AC
  Administered 2017-04-26: 1000 mL via INTRAVENOUS

## 2017-04-26 MED ORDER — ONDANSETRON HCL 4 MG/2ML IJ SOLN: 4.0000 mg | Freq: Three times a day (TID) | INTRAMUSCULAR | Status: DC | PRN

## 2017-04-26 MED ORDER — AMLODIPINE BESYLATE 5 MG PO TABS
5.0000 mg | ORAL_TABLET | Freq: Every day | ORAL | Status: DC
  Administered 2017-04-27 – 2017-04-29 (×4): 5 mg via ORAL
  Filled 2017-04-26 (×4): qty 1

## 2017-04-26 MED ORDER — PIPERACILLIN-TAZOBACTAM 3.375 G IVPB 30 MIN
3.3750 g | Freq: Once | INTRAVENOUS | Status: AC
  Administered 2017-04-26: 3.375 g via INTRAVENOUS
  Filled 2017-04-26: qty 50

## 2017-04-26 MED ORDER — ALBUTEROL SULFATE (2.5 MG/3ML) 0.083% IN NEBU
3.0000 mL | INHALATION_SOLUTION | RESPIRATORY_TRACT | Status: DC | PRN
  Administered 2017-04-26: 3 mL via RESPIRATORY_TRACT

## 2017-04-26 MED ORDER — OLANZAPINE 5 MG PO TBDP
5.0000 mg | ORAL_TABLET | Freq: Every day | ORAL | Status: DC
  Administered 2017-04-26 – 2017-04-28 (×3): 5 mg via ORAL
  Filled 2017-04-26 (×4): qty 1

## 2017-04-26 MED ORDER — ZOLPIDEM TARTRATE 5 MG PO TABS: 5.0000 mg | ORAL_TABLET | Freq: Every evening | ORAL | Status: DC | PRN

## 2017-04-26 MED ORDER — ACETAMINOPHEN 325 MG PO TABS
650.0000 mg | ORAL_TABLET | Freq: Four times a day (QID) | ORAL | Status: DC | PRN
  Administered 2017-04-27: 650 mg via ORAL
  Filled 2017-04-26: qty 2

## 2017-04-26 MED ORDER — ALUM & MAG HYDROXIDE-SIMETH 200-200-20 MG/5ML PO SUSP: 30.0000 mL | Freq: Four times a day (QID) | ORAL | Status: DC | PRN

## 2017-04-26 MED ORDER — HYDROCORTISONE 2.5 % RE CREA
TOPICAL_CREAM | Freq: Four times a day (QID) | RECTAL | Status: DC | PRN
  Filled 2017-04-26: qty 28.35

## 2017-04-26 MED ORDER — SODIUM CHLORIDE 0.9 % IV BOLUS
1000.0000 mL | Freq: Once | INTRAVENOUS | Status: AC
Start: 2017-04-26 — End: 2017-04-26
  Administered 2017-04-26: 1000 mL via INTRAVENOUS

## 2017-04-26 MED ORDER — IPRATROPIUM-ALBUTEROL 0.5-2.5 (3) MG/3ML IN SOLN
3.0000 mL | Freq: Four times a day (QID) | RESPIRATORY_TRACT | Status: DC
  Administered 2017-04-27: 3 mL via RESPIRATORY_TRACT
  Filled 2017-04-26: qty 3

## 2017-04-26 MED ORDER — HEPARIN SODIUM (PORCINE) 5000 UNIT/ML IJ SOLN
5000.0000 [IU] | Freq: Two times a day (BID) | INTRAMUSCULAR | Status: DC
  Administered 2017-04-26 – 2017-04-29 (×6): 5000 [IU] via SUBCUTANEOUS
  Filled 2017-04-26 (×6): qty 1

## 2017-04-26 MED ORDER — HYDROXYZINE HCL 50 MG/ML IM SOLN
25.0000 mg | Freq: Four times a day (QID) | INTRAMUSCULAR | Status: DC | PRN
  Filled 2017-04-26: qty 0.5

## 2017-04-26 MED ORDER — NICOTINE 21 MG/24HR TD PT24
21.0000 mg | MEDICATED_PATCH | Freq: Every day | TRANSDERMAL | Status: DC
  Administered 2017-04-27 – 2017-04-29 (×3): 21 mg via TRANSDERMAL
  Filled 2017-04-26 (×3): qty 1

## 2017-04-26 MED ORDER — ENOXAPARIN SODIUM 40 MG/0.4ML ~~LOC~~ SOLN
40.0000 mg | SUBCUTANEOUS | Status: DC
  Filled 2017-04-26: qty 0.4

## 2017-04-26 MED ORDER — SERTRALINE HCL 50 MG PO TABS
50.0000 mg | ORAL_TABLET | Freq: Every day | ORAL | Status: DC
  Administered 2017-04-27 – 2017-04-29 (×3): 50 mg via ORAL
  Filled 2017-04-26 (×3): qty 1

## 2017-04-26 MED ORDER — LORAZEPAM 0.5 MG PO TABS
0.5000 mg | ORAL_TABLET | ORAL | Status: DC | PRN
  Administered 2017-04-27 – 2017-04-28 (×2): 0.5 mg via ORAL
  Filled 2017-04-26 (×2): qty 1

## 2017-04-26 MED ORDER — PIPERACILLIN-TAZOBACTAM IN DEX 2-0.25 GM/50ML IV SOLN
2.2500 g | Freq: Three times a day (TID) | INTRAVENOUS | Status: DC
  Administered 2017-04-27: 2.25 g via INTRAVENOUS
  Filled 2017-04-26 (×3): qty 50

## 2017-04-26 MED ORDER — GUAIFENESIN 100 MG/5ML PO SOLN: 200.0000 mg | Freq: Four times a day (QID) | ORAL | Status: DC | PRN

## 2017-04-26 NOTE — ED Triage Notes (Signed)
Per EMS, pt from [facility] sent for hypertension and vomiting. Pt vomited twice today. Pt vomited after eating breakfast and taking her morning medications. Pt also vomited after eating lunch. Staff are also concerned that the pt's BP has been higher than normal this afternoon. Pt has hx of dementia, has DNR.  Pt given duoneb treatment for wheezing en route to hospital.   BP 195/97 HR 83 RR 24 CBG 174

## 2017-04-26 NOTE — Progress Notes (Signed)
Pharmacy Antibiotic Note  Annette Cantrell is a 84 y.o. female admitted on 04/26/2017 with pneumonia.  Pharmacy has been consulted for piperacillin/tazobactam + vancomycin dosing.  Patient weight = 41 kg per RN CrCl ~14 mL/min  Plan: Piperacillin/tazobactam 2.25 g IV q8h  Vancomycin 500 mg IV once, further doses based on renal function and levels Per discussion with MD, if MRSA PCR is negative, can discontinue vancomycin.     Temp (24hrs), Avg:97.4 F (36.3 C), Min:97.4 F (36.3 C), Max:97.4 F (36.3 C)  Recent Labs  Lab 04/26/17 1652 04/26/17 2001  WBC 11.5*  --   CREATININE 1.69*  --   LATICACIDVEN  --  3.28*    CrCl cannot be calculated (Unknown ideal weight.).    No Known Allergies  Antimicrobials this admission: Piperacillin/tazobactam 4/22 >>  vancomycin 4/22 >>   Dose adjustments this admission:  Microbiology results: 4/22 BCx: Sent 4/22 UCx: Sent  4/22 MRSA PCR: Sent  Thank you for allowing pharmacy to be a part of this patient's care.  Lenis Noon, PharmD 04/26/2017 9:37 PM

## 2017-04-26 NOTE — H&P (Signed)
History and Physical    Annette Cantrell BOF:[id number] DOB: 05-Mar-1922 DOA: 04/26/2017  Referring MD/NP/PA:   PCP: Sande Brothers, MD   Patient coming from:  The patient is coming from ALF.  At baseline, pt is dependent for most of ADL.  Chief Complaint: AMS, nausea, vomiting, cough  HPI: Annette Cantrell is a 84 y.o. female with medical history significant of COPD, hyperlipidemia, stroke, depression with anxiety, CAD, sCHF with EF 45%, dementia, tobacco abuse, CKD-III, who presents with altered mental status, nausea, vomiting and cough.  Patient has dementia and AMS, and is unable to provide accurate medical history, therefore, most of the history is obtained by discussing the case with ED physician, per EMS report, and with the nursing staff.  Per report, patient has an on the vomited twice in the facility.  She also has elevated the blood pressure in facility.  Patient is more confused than her baseline.  She is more sleepy and less talkative than her baseline.  Patient has chronic cough. When I saw pt in ED, she is confused mildly, complains of cough and feeling cold. Denies any pain. Said "no" when asked if she has chest pain, abdominal pain and headache.  She moves all extremities.  ED Course: pt was found to have WBC 11.5, lactic acid is 3.28, negative troponin, lipase of 44, pending urinalysis, sodium 147, worsening renal function, temperature 97.4, blood pressure 183/100, no tachycardia, has tachypnea, oxygen saturation 92-95% on room air.  Chest x-ray showed right lower lobe infiltration.  Patient is admitted to telemetry bed as inpatient.  Review of Systems: Could not be reviewed due to dementia and altered mental status.  Allergy: No Known Allergies  Past Medical History:  Diagnosis Date  . CAD (coronary artery disease)   . Chronic systolic heart failure ([place])   . COPD 03/30/2008   Qualifier: Diagnosis of  By: [name] MD, [name]   . Dementia   . Heart disease, unspecified    . Herpes zoster 01/30/2012  . High cholesterol   . History of positive PPD, untreated   . Infected sebaceous cyst 01/25/2012  . LUNG NODULE 03/30/2008   Qualifier: Diagnosis of  By: [name] MD, [name]   . Stroke [facility])     Past Surgical History:  Procedure Laterality Date  . HIP ARTHROPLASTY  01/31/2012   Procedure: ARTHROPLASTY BIPOLAR HIP;  Surgeon: Rozanna Box, MD;  Location: [facility];  Service: Orthopedics;  Laterality: Right;    Social History:  reports that she has been smoking cigarettes.  She has been smoking about 0.20 packs per day. She has never used smokeless tobacco. She reports that she does not drink alcohol or use drugs.  Family History:  Family History  Problem Relation Age of Onset  . Stroke Mother      Prior to Admission medications   Medication Sig Start Date End Date Taking? Authorizing Provider  acetaminophen (TYLENOL) 325 MG tablet Take 650 mg by mouth every morning.   Yes [provider]  albuterol (PROVENTIL) (2.5 MG/3ML) 0.083% nebulizer solution Inhale 3 mLs into the lungs every 4 (four) hours as needed for wheezing or shortness of breath. 01/14/16  Yes Mesner, Corene Cornea, MD  alum & mag hydroxide-simeth ([place]) 200-200-20 MG/5ML suspension Take 30 mLs by mouth every 6 (six) hours as needed for indigestion or heartburn.   Yes [provider]  divalproex (DEPAKOTE SPRINKLE) 125 MG capsule Take 125 mg by mouth 4 (four) times daily.    Yes [provider]  hydrocortisone 2.5 % ointment Apply 1 application topically every 6 (six) hours as needed (pain).   Yes [provider]  ipratropium-albuterol (DUONEB) 0.5-2.5 (3) MG/3ML SOLN Take 3 mLs by nebulization every 6 (six) hours as needed (sob and cough).   Yes [provider]  LORazepam (ATIVAN) 0.5 MG tablet Take 0.5 mg by mouth every 4 (four) hours as needed for anxiety.   Yes [provider]  mirtazapine (REMERON) 7.5 MG tablet Take 7.5 mg by mouth at bedtime.    Yes [provider]  Nutritional Supplements (NUTRITIONAL SUPPLEMENT PO) Take 1 Container by mouth 3 (three) times daily with meals. Great Shakes   Yes [provider]  OLANZapine zydis (ZYPREXA) 5 MG disintegrating tablet Take 1 tablet (5 mg total) by mouth at bedtime. 04/05/13  Yes Velvet Bathe, MD  sertraline (ZOLOFT) 50 MG tablet Take 50 mg by mouth daily.   Yes [provider]  guaifenesin (ROBITUSSIN) 100 MG/5ML syrup Take 200 mg by mouth 4 (four) times daily as needed for cough.    [provider]  magnesium hydroxide (MILK OF MAGNESIA) 400 MG/5ML suspension Take 30 mLs by mouth daily as needed for mild constipation.    [provider]  predniSONE (DELTASONE) 20 MG tablet 3 tabs po day one, then 2 po daily x 4 days Patient not taking: Reported on 04/26/2017 01/14/16   Mesner, Corene Cornea, MD  Vitamin D, Ergocalciferol, (DRISDOL) 50000 units CAPS capsule Take 50,000 Units by mouth every 30 (thirty) days.    [provider]    Physical Exam: Vitals:   04/26/17 1550 04/26/17 1730 04/26/17 1900 04/26/17 2044  BP: (!) 183/100 (!) 181/79 (!) 179/86 (!) 169/68  Pulse: 96 87 88 85  Resp: 18 (!) 21 20 20   Temp: (!) 97.4 F (36.3 C)     TempSrc: Oral     SpO2: 92% 99% 95% 92%   General: Not in acute distress HEENT:       Eyes: PERRL, EOMI, no scleral icterus.       ENT: No discharge from the ears and nose, no pharynx injection, no tonsillar enlargement.        Neck: No JVD, no bruit, no mass felt. Heme: No neck lymph node enlargement. Cardiac: S1/S2, RRR, No murmurs, No gallops or rubs. Respiratory: has Rhonchi bilaterally history  GI: Soft, nondistended, seems to have tenderness in central abdomen, no rebound pain, no organomegaly, BS present. GU: No hematuria Ext: No pitting leg edema bilaterally. 2+DP/PT pulse bilaterally. Musculoskeletal: No joint deformities, No joint redness or warmth, no limitation of ROM in spin. Skin: No rashes.    Neuro: mildly confused, knows her own name, not oriented X3, cranial nerves II-XII grossly intact, moves all extremities normally. Psych: Patient is not psychotic, no suicidal or hemocidal ideation.  Labs on Admission: I have personally reviewed following labs and imaging studies  CBC: Recent Labs  Lab 04/26/17 1652  WBC 11.5*  NEUTROABS 9.7*  HGB 9.4*  HCT 30.2*  MCV 104.1*  PLT 456   Basic Metabolic Panel: Recent Labs  Lab 04/26/17 1652  NA 147*  K 4.4  CL 108  CO2 23  GLUCOSE 189*  BUN 49*  CREATININE 1.69*  CALCIUM 9.5   GFR: CrCl cannot be calculated (Unknown ideal weight.). Liver Function Tests: Recent Labs  Lab 04/26/17 1652  AST 28  ALT 13*  ALKPHOS 96  BILITOT 0.6  PROT 7.9  ALBUMIN 3.3*   Recent Labs  Lab  04/26/17 1652  LIPASE 44   No results for input(s): AMMONIA in the last 168 hours. Coagulation Profile: No results for input(s): INR, PROTIME in the last 168 hours. Cardiac Enzymes: No results for input(s): CKTOTAL, CKMB, CKMBINDEX, TROPONINI in the last 168 hours. BNP (last 3 results) No results for input(s): PROBNP in the last 8760 hours. HbA1C: No results for input(s): HGBA1C in the last 72 hours. CBG: No results for input(s): GLUCAP in the last 168 hours. Lipid Profile: No results for input(s): CHOL, HDL, LDLCALC, TRIG, CHOLHDL, LDLDIRECT in the last 72 hours. Thyroid Function Tests: No results for input(s): TSH, T4TOTAL, FREET4, T3FREE, THYROIDAB in the last 72 hours. Anemia Panel: No results for input(s): VITAMINB12, FOLATE, FERRITIN, TIBC, IRON, RETICCTPCT in the last 72 hours. Urine analysis:    Component Value Date/Time   COLORURINE YELLOW 07/03/2013 0117   APPEARANCEUR CLEAR 07/03/2013 0117   LABSPEC 1.024 07/03/2013 0117   PHURINE 5.0 07/03/2013 0117   GLUCOSEU NEGATIVE 07/03/2013 0117   HGBUR NEGATIVE 07/03/2013 0117   BILIRUBINUR NEGATIVE 07/03/2013 0117   KETONESUR 15 (A) 07/03/2013 0117   PROTEINUR NEGATIVE  07/03/2013 0117   UROBILINOGEN 1.0 07/03/2013 0117   NITRITE NEGATIVE 07/03/2013 0117   LEUKOCYTESUR NEGATIVE 07/03/2013 0117   Sepsis Labs: @LABRCNTIP (procalcitonin:4,lacticidven:4) )No results found for this or any previous visit (from the past 240 hour(s)).   Radiological Exams on Admission: Dg Chest 2 View  Result Date: 04/26/2017 CLINICAL DATA:  Cough. EXAM: CHEST - 2 VIEW COMPARISON:  Chest x-ray dated January 14, 2016. CT chest dated March 23, 2008 FINDINGS: The patient is rotated to the right. Stable cardiomediastinal silhouette. Normal pulmonary vascularity. Unchanged right upper lobe lung nodule. New mild patchy opacity in the right lower lobe. No focal consolidation, pleural effusion, or pneumothorax. No acute osseous abnormality. IMPRESSION: 1. New mild patchy opacity in the right lower lobe may reflect atelectasis versus infiltrate/aspiration. 2. Unchanged right upper lobe nodule. Electronically Signed   By: Titus Dubin M.D.   On: 04/26/2017 17:08   Ct Head Wo Contrast  Result Date: 04/26/2017 CLINICAL DATA:  84 year old hypertensive female with vomiting. History of dementia. Initial encounter. EXAM: CT HEAD WITHOUT CONTRAST TECHNIQUE: Contiguous axial images were obtained from the base of the skull through the vertex without intravenous contrast. COMPARISON:  02/09/2017 CT. FINDINGS: Brain: No intracranial hemorrhage or CT evidence of large acute infarct. Chronic microvascular changes. Moderate global atrophy. No intracranial mass lesion noted on this unenhanced exam. Vascular: Vascular calcifications Skull: No skull fracture Sinuses/Orbits: No acute orbital abnormality. Visualized paranasal sinuses, mastoid air cells and middle ear cavities are clear. Other: Almost complete resolution of right frontal subcutaneous hematoma. IMPRESSION: No acute intracranial abnormality. Global atrophy. Chronic microvascular changes. Electronically Signed   By: Genia Del M.D.   On: 04/26/2017  18:21     EKG: Independently reviewed.  Sinus rhythm, QTC 509, bifascicular block, early R wave progression   Assessment/Plan Principal Problem:   Sepsis ([place]) Active Problems:   Tobacco abuse   HTN (hypertension)   Anemia   Depression   COPD (chronic obstructive pulmonary disease) (H[facility])   Protein-calorie malnutrition, severe (HCC)   Acute metabolic encephalopathy   Aspiration pneumonia (H[facility])   Nausea & vomiting   Acute renal failure superimposed on stage 3 chronic kidney disease (HCC)   Chronic systolic CHF (congestive heart failure) ([place])   Sepsis and possible aspiration pneumonia: Patient meets criteria for sepsis with leukocytosis, tachypnea.  Lactic acid elevated at 3.28.  Currently hemodynamically stable.  Source of infection is not clear, possibly due to aspiration pneumonia versus HCAP given cough and right lower lobe infiltration on chest x-ray. Pending UA.   - will admit to tele bed as inpt - IV Vancomycin and Zosyn ( discussed with pharmacist, who will do MRSA screen. If negative, will d/c vanco and keep zosyn alone) - prn Robitussin  for cough  - prn Albuterol Nebs, DuoNeb prn for SOB - Urine legionella and S. pneumococcal antigen - Follow up blood culture x2, sputum culture  - will get Procalcitonin and trend lactic acid level per sepsis protocol - IVF: 2L of NS bolus in ED, followed by 75 mL per hour of NS - NPO night - get SLP in AM - f/u UA and urine culture  COPD excerbation: pt may also have COPD exacerbation given rhonchi bilaterally -Present treatment as above  HTN: Bp 183/100. Pt is not on Bp meds. -start amlodipine 5 mg daily -IV hydralazine prn  Macrocytic Anemia: Hgb stable. 9.8 on 01/13/17-->.9.4 today.MCV 104. -f/u by CBC  Chronic systolic CHF: 2D echo on 2/35/57 showed EF of 45% patient does not have leg edema JVD.  Chest x-ray has no pulmonary edema.  CHF is compensated. Patient is not taking diuretics at home.   -Check BN P  Tobacco  abuse: -nicotine patch  Depression and anxiety: Stable, no suicidal or homicidal ideations. -Continue home medications: ativan prn, Remeron, olanzapine, Depakote  Protein-calorie malnutrition, severe ([place]): -nutrition consult  AoCKD-III: Baseline Cre is 1.25 on 01/14/16, pt's Cre is 1.69 and BUN 49on admission. Likely due to prerenal secondary to dehydration - IVF as above - Follow up renal function by BMP    Nausea & vomiting: Etiology is not clear.  Lipase is normal.  Patient seems to have central abdominal tenderness. May be due to sepsis, but since patient cannot provide detailed information, will get image to  rule out intra-abdominal issues. -PRN hydroxyzine for nausea -Follow-up CT abdomen/pelvis  Acute metabolic encephalopathy: CT head negative. likely due to sepsis. -Frequent neuro check - f/u urinalysis  DVT ppx: SQ Lovenox Code Status: DNR (the yellow paper document of DNR at bedside) Family Communication: None at bed side.  Disposition Plan:  Anticipate discharge back to previous home environment Consults called:  none Admission status:   Inpatient/tele     Date of Service 04/26/2017    Ivor Costa [facility] Pager [phone]  If 7PM-7AM, please contact night-coverage [email or web address] Password [facility] 04/26/2017, 9:12 PM

## 2017-04-26 NOTE — ED Provider Notes (Addendum)
[facility] DEPT Provider Note   CSN: [id number] Arrival date & time: 04/26/17  1525     History   Chief Complaint Chief Complaint  Patient presents with  . Emesis  . Hypertension    HPI Annette Cantrell is a 84 y.o. female.  HPI  84 year old female with history of coronary artery disease, COPD, dementia, hyperlipidemia presents from [place] with concern for emesis and blood pressure is more elevated than baseline.  History is limited by patient's dementia, level 5 caveat.  Per granddaughter and facility, patient had vomited twice today.  She vomited after eating breakfast and taking her morning medications, and vomited again after eating lunch.  Patient is DNR.  Granddaughter reports that she had previously been on hospice, but had graduated from it, and at this time goals of care include laboratory work hospitalization, antibiotics and further care.  She reports that she has a history of COPD, and they talked about putting her on home oxygen before, however at this time did not.  Patient had some wheezing and right to the hospital and was given duo nebs by EMS.  Patient initially denies any pain or symptoms, but on continued questioning, does acknowledge that she had had vomiting and nausea, but does not feel nauseous at this time.  Initially denied abdominal pain, but on exam noted some mild epigastric pain.  Also reported headache in the center of her head, but is unable to state when it started or characterize it further.  Granddaughter does not know of any falls, patient denies falls.  Called facility with different nurse on than who had seen her previously but had reported she had not been as talkative and had vomited.   Past Medical History:  Diagnosis Date  . CAD (coronary artery disease)   . Chronic systolic heart failure ([place])   . COPD 03/30/2008   Qualifier: Diagnosis of  By: [name] MD, [name]   . Dementia   . Heart disease,  unspecified   . Herpes zoster 01/30/2012  . High cholesterol   . History of positive PPD, untreated   . Infected sebaceous cyst 01/25/2012  . LUNG NODULE 03/30/2008   Qualifier: Diagnosis of  By: [name] MD, [name]   . Stroke [facility])     Patient Active Problem List   Diagnosis Date Noted  . Acute metabolic encephalopathy 25/95/6387  . Aspiration pneumonia ([place]) 04/26/2017  . Sepsis ([place]) 04/26/2017  . Nausea & vomiting 04/26/2017  . Acute renal failure superimposed on stage 3 chronic kidney disease ([place]) 04/26/2017  . Chronic systolic CHF (congestive heart failure) ([place]) 04/26/2017  . Palliative care encounter 04/04/2013  . Dysphagia, unspecified(787.20) 04/04/2013  . Weakness generalized 04/04/2013  . Malnutrition of moderate degree ([place]) 04/03/2013  . Thrombocytopenia, unspecified ([place]) 04/01/2013  . Protein-calorie malnutrition, severe ([place]) 04/01/2013  . COPD (chronic obstructive pulmonary disease) ([place]) 03/31/2013  . Lactic acidosis 03/31/2013  . Chest pain 03/31/2013  . Acute respiratory failure with hypoxia ([place]) 11/17/2012  . COPD exacerbation ([place]) 11/17/2012  . Acute renal failure ([place]) 11/17/2012  . HTN (hypertension) 11/17/2012  . Anemia 11/17/2012  . Depression 11/17/2012  . Dementia   . Tobacco abuse 07/03/2009  . HYPERLIPIDEMIA 04/20/2008    Past Surgical History:  Procedure Laterality Date  . HIP ARTHROPLASTY  01/31/2012   Procedure: ARTHROPLASTY BIPOLAR HIP;  Surgeon: Rozanna Box, MD;  Location: [facility];  Service: Orthopedics;  Laterality: Right;     OB History   None  Home Medications    Prior to Admission medications   Medication Sig Start Date End Date Taking? Authorizing Provider  acetaminophen (TYLENOL) 325 MG tablet Take 650 mg by mouth every morning.   Yes [provider]  albuterol (PROVENTIL) (2.5 MG/3ML) 0.083% nebulizer solution Inhale 3 mLs into the lungs every 4 (four) hours as needed for wheezing or shortness of breath.  01/14/16  Yes Mesner, Corene Cornea, MD  alum & mag hydroxide-simeth ([place]) 200-200-20 MG/5ML suspension Take 30 mLs by mouth every 6 (six) hours as needed for indigestion or heartburn.   Yes [provider]  divalproex (DEPAKOTE SPRINKLE) 125 MG capsule Take 125 mg by mouth 4 (four) times daily.    Yes [provider]  hydrocortisone 2.5 % ointment Apply 1 application topically every 6 (six) hours as needed (pain).   Yes [provider]  ipratropium-albuterol (DUONEB) 0.5-2.5 (3) MG/3ML SOLN Take 3 mLs by nebulization every 6 (six) hours as needed (sob and cough).   Yes [provider]  LORazepam (ATIVAN) 0.5 MG tablet Take 0.5 mg by mouth every 4 (four) hours as needed for anxiety.   Yes [provider]  mirtazapine (REMERON) 7.5 MG tablet Take 7.5 mg by mouth at bedtime.   Yes [provider]  Nutritional Supplements (NUTRITIONAL SUPPLEMENT PO) Take 1 Container by mouth 3 (three) times daily with meals. Great Shakes   Yes [provider]  OLANZapine zydis (ZYPREXA) 5 MG disintegrating tablet Take 1 tablet (5 mg total) by mouth at bedtime. 04/05/13  Yes Velvet Bathe, MD  sertraline (ZOLOFT) 50 MG tablet Take 50 mg by mouth daily.   Yes [provider]  guaifenesin (ROBITUSSIN) 100 MG/5ML syrup Take 200 mg by mouth 4 (four) times daily as needed for cough.    [provider]  magnesium hydroxide (MILK OF MAGNESIA) 400 MG/5ML suspension Take 30 mLs by mouth daily as needed for mild constipation.    [provider]  predniSONE (DELTASONE) 20 MG tablet 3 tabs po day one, then 2 po daily x 4 days Patient not taking: Reported on 04/26/2017 01/14/16   Mesner, Corene Cornea, MD  Vitamin D, Ergocalciferol, (DRISDOL) 50000 units CAPS capsule Take 50,000 Units by mouth every 30 (thirty) days.    [provider]    Family History Family History  Problem Relation Age of Onset  . Stroke Mother     Social History Social History    Tobacco Use  . Smoking status: Current Every Day Smoker    Packs/day: 0.20    Types: Cigarettes  . Smokeless tobacco: Never Used  Substance Use Topics  . Alcohol use: No  . Drug use: No     Allergies   Patient has no known allergies.   Review of Systems Review of Systems  Unable to perform ROS: Dementia  Constitutional: Negative for fever.  Respiratory: Positive for cough. Negative for shortness of breath.   Cardiovascular: Negative for chest pain.  Gastrointestinal: Positive for nausea and vomiting. Negative for abdominal pain.  Genitourinary: Negative for difficulty urinating.  Neurological: Positive for headaches.     Physical Exam Updated Vital Signs BP (!) 169/68 (BP Location: Left Arm)   Pulse 85   Temp (!) 97.4 F (36.3 C) (Oral)   Resp 20   SpO2 92%   Physical Exam  Constitutional: She appears well-developed and well-nourished. No distress.  HENT:  Head: Normocephalic and atraumatic.  Eyes: Conjunctivae and EOM are normal.  Neck: Normal range of motion.  Cardiovascular:  Normal rate, regular rhythm, normal heart sounds and intact distal pulses. Exam reveals no gallop and no friction rub.  No murmur heard. Pulmonary/Chest: Effort normal and breath sounds normal. No respiratory distress. She has no wheezes. She has no rales.  Rhonchi left sided, clears with cough  Abdominal: Soft. She exhibits no distension. There is tenderness (very mild epigastric). There is no guarding.  Musculoskeletal: She exhibits no edema or tenderness.  Neurological: She is alert.  Oriented to self and location   Skin: Skin is warm and dry. No rash noted. She is not diaphoretic. No erythema.  Nursing note and vitals reviewed.    ED Treatments / Results  Labs (all labs ordered are listed, but only abnormal results are displayed) Labs Reviewed  CBC WITH DIFFERENTIAL/PLATELET - Abnormal; Notable for the following components:      Result Value   WBC 11.5 (*)    RBC 2.90 (*)     Hemoglobin 9.4 (*)    HCT 30.2 (*)    MCV 104.1 (*)    Neutro Abs 9.7 (*)    Lymphs Abs 0.6 (*)    Monocytes Absolute 1.3 (*)    All other components within normal limits  COMPREHENSIVE METABOLIC PANEL - Abnormal; Notable for the following components:   Sodium 147 (*)    Glucose, Bld 189 (*)    BUN 49 (*)    Creatinine, Ser 1.69 (*)    Albumin 3.3 (*)    ALT 13 (*)    GFR calc non Af Amer 25 (*)    GFR calc Af Amer 29 (*)    Anion gap 16 (*)    All other components within normal limits  I-STAT CG4 LACTIC ACID, ED - Abnormal; Notable for the following components:   Lactic Acid, Venous 3.28 (*)    All other components within normal limits  URINE CULTURE  CULTURE, BLOOD (ROUTINE X 2)  CULTURE, BLOOD (ROUTINE X 2)  CULTURE, EXPECTORATED SPUTUM-ASSESSMENT  GRAM STAIN  MRSA PCR SCREENING  LIPASE, BLOOD  URINALYSIS, ROUTINE W REFLEX MICROSCOPIC  PROCALCITONIN  BRAIN NATRIURETIC PEPTIDE  HIV ANTIBODY (ROUTINE TESTING)  STREP PNEUMONIAE URINARY ANTIGEN  LEGIONELLA PNEUMOPHILA SEROGP 1 UR AG  I-STAT TROPONIN, ED    EKG EKG Interpretation  Date/Time:  Monday April 26 2017 17:05:58 EDT Ventricular Rate:  86 PR Interval:    QRS Duration: 133 QT Interval:  425 QTC Calculation: 509 R Axis:   -33 Text Interpretation:  Sinus rhythm Atrial premature complexes Right bundle branch block Inferior infarct, age indeterminate Probable anterolateral infarct, old No significant change since last tracing Confirmed by Gareth Morgan [phone]) on 04/26/2017 6:14:49 PM   Radiology Dg Chest 2 View  Result Date: 04/26/2017 CLINICAL DATA:  Cough. EXAM: CHEST - 2 VIEW COMPARISON:  Chest x-ray dated January 14, 2016. CT chest dated March 23, 2008 FINDINGS: The patient is rotated to the right. Stable cardiomediastinal silhouette. Normal pulmonary vascularity. Unchanged right upper lobe lung nodule. New mild patchy opacity in the right lower lobe. No focal consolidation, pleural effusion, or  pneumothorax. No acute osseous abnormality. IMPRESSION: 1. New mild patchy opacity in the right lower lobe may reflect atelectasis versus infiltrate/aspiration. 2. Unchanged right upper lobe nodule. Electronically Signed   By: Titus Dubin M.D.   On: 04/26/2017 17:08   Ct Head Wo Contrast  Result Date: 04/26/2017 CLINICAL DATA:  84 year old hypertensive female with vomiting. History of dementia. Initial encounter. EXAM: CT HEAD WITHOUT CONTRAST TECHNIQUE: Contiguous axial images were obtained  from the base of the skull through the vertex without intravenous contrast. COMPARISON:  02/09/2017 CT. FINDINGS: Brain: No intracranial hemorrhage or CT evidence of large acute infarct. Chronic microvascular changes. Moderate global atrophy. No intracranial mass lesion noted on this unenhanced exam. Vascular: Vascular calcifications Skull: No skull fracture Sinuses/Orbits: No acute orbital abnormality. Visualized paranasal sinuses, mastoid air cells and middle ear cavities are clear. Other: Almost complete resolution of right frontal subcutaneous hematoma. IMPRESSION: No acute intracranial abnormality. Global atrophy. Chronic microvascular changes. Electronically Signed   By: Genia Del M.D.   On: 04/26/2017 18:21    Procedures .Critical Care Performed by: Gareth Morgan, MD Authorized by: Gareth Morgan, MD   Critical care provider statement:    Critical care time (minutes):  30   Critical care was necessary to treat or prevent imminent or life-threatening deterioration of the following conditions:  Sepsis   Critical care was time spent personally by me on the following activities:  Examination of patient, evaluation of patient's response to treatment, obtaining history from patient or surrogate, development of treatment plan with patient or surrogate, pulse oximetry, re-evaluation of patient's condition, ordering and review of laboratory studies, ordering and review of radiographic studies and  ordering and performing treatments and interventions   (including critical care time)  Medications Ordered in ED Medications  sodium chloride 0.9 % bolus 1,000 mL (1,000 mLs Intravenous New Bag/Given 04/26/17 2026)  acetaminophen (TYLENOL) tablet 650 mg (has no administration in time range)  albuterol (PROVENTIL) (2.5 MG/3ML) 0.083% nebulizer solution 3 mL (has no administration in time range)  alum & mag hydroxide-simeth (MAALOX/MYLANTA) 200-200-20 MG/5ML suspension 30 mL (has no administration in time range)  divalproex (DEPAKOTE SPRINKLE) capsule 125 mg (has no administration in time range)  guaifenesin (ROBITUSSIN) 100 MG/5ML syrup 200 mg (has no administration in time range)  hydrocortisone 2.5 % ointment 1 application (has no administration in time range)  ipratropium-albuterol (DUONEB) 0.5-2.5 (3) MG/3ML nebulizer solution 3 mL (has no administration in time range)  LORazepam (ATIVAN) tablet 0.5 mg (has no administration in time range)  magnesium hydroxide (MILK OF MAGNESIA) suspension 30 mL (has no administration in time range)  mirtazapine (REMERON) tablet 7.5 mg (has no administration in time range)  OLANZapine zydis (ZYPREXA) disintegrating tablet 5 mg (has no administration in time range)  sertraline (ZOLOFT) tablet 50 mg (has no administration in time range)  nicotine (NICODERM CQ - dosed in mg/24 hours) patch 21 mg (has no administration in time range)  0.9 %  sodium chloride infusion (has no administration in time range)  enoxaparin (LOVENOX) injection 40 mg (has no administration in time range)  hydrALAZINE (APRESOLINE) injection 5 mg (has no administration in time range)  zolpidem (AMBIEN) tablet 5 mg (has no administration in time range)  vancomycin (VANCOCIN) IVPB 750 mg/150 ml premix (has no administration in time range)  iopamidol (ISOVUE-300) 61 % injection (has no administration in time range)  hydrOXYzine (ATARAX/VISTARIL) tablet 10 mg (has no administration in time  range)  hydrOXYzine (VISTARIL) injection 25 mg (has no administration in time range)  piperacillin-tazobactam (ZOSYN) IVPB 3.375 g (has no administration in time range)  ondansetron (ZOFRAN) injection 4 mg (4 mg Intravenous Given 04/26/17 1653)  sodium chloride 0.9 % bolus 1,000 mL (0 mLs Intravenous Stopped 04/26/17 1855)     Initial Impression / Assessment and Plan / ED Course  I have reviewed the triage vital signs and the nursing notes.  Pertinent labs & imaging results that were available during my  care of the patient were reviewed by me and considered in my medical decision making (see chart for details).     84 year old female with history of coronary artery disease, COPD, dementia, hyperlipidemia presents from [place] with concern for emesis and blood pressure is more elevated than baseline.    Differential diagnoses for vomiting includes urinary tract infection, bronchitis or pneumonia given some report of increased cough per patient, intracranial etiology given headache, cardiac ischemia, or intra-abdominal cause.  Initial lab work completed showing normal transaminases and lipase.   CT head shows no acute abnormalities.  EKG and troponin without acute abnormalities.  Sodium 147, likely from dehydration. Abdominal exam initially benign, reevaluation without any abdominal pain. Doubt cholecystitis, AAA, SBO or other given history and exam.     Chest x-ray shows right sided atelectasis or infiltrate.  Pt reported cough but facility reported it is chronic.  Concern this may represent pneumonia. Facility also now reports pt is normally more alert, talkative, "punchy" but has been not herself today.  Slight leukocytosis.  Lactic acid added on and positive to 3.28.  Likely combination of dehydration and possible sepsis from pneumonia.  Urinalysis is still pending.  Patient is in facility, will treat for healthcare associated pneumonia with vancomycin and cefepime.  She was ordered 30  cc/kg of normal saline.  Admitted for further care.    Final Clinical Impressions(s) / ED Diagnoses   Final diagnoses:  Non-intractable vomiting with nausea, unspecified vomiting type  HCAP (healthcare-associated pneumonia)  Sepsis, due to unspecified organism [facility])    ED Discharge Orders    None       Gareth Morgan, MD 04/26/17 2052    Gareth Morgan, MD 05/19/17 1045

## 2017-04-26 NOTE — ED Notes (Signed)
Purwik placed on pt.  RN notified.

## 2017-04-26 NOTE — Progress Notes (Signed)
Pharmacy: Vancomycin Called to clarify Vancomycin dose - orders for 750 and 500 mg active. Wt updated to 47.6 kg Plan: vanco 750 mg IV x 1 dose tonight F/u in am. Eudelia Bunch, Pharm.D. 04/26/2017 11:39 PM

## 2017-04-27 ENCOUNTER — Other Ambulatory Visit: Payer: Self-pay

## 2017-04-27 DIAGNOSIS — J449 Chronic obstructive pulmonary disease, unspecified: Secondary | ICD-10-CM

## 2017-04-27 LAB — COMPREHENSIVE METABOLIC PANEL
ALBUMIN: 2.7 g/dL — AB (ref 3.5–5.0)
ALT: 13 U/L — ABNORMAL LOW (ref 14–54)
ANION GAP: 12 (ref 5–15)
AST: 19 U/L (ref 15–41)
Alkaline Phosphatase: 75 U/L (ref 38–126)
BILIRUBIN TOTAL: 0.7 mg/dL (ref 0.3–1.2)
BUN: 39 mg/dL — ABNORMAL HIGH (ref 6–20)
CHLORIDE: 113 mmol/L — AB (ref 101–111)
CO2: 21 mmol/L — ABNORMAL LOW (ref 22–32)
Calcium: 8.6 mg/dL — ABNORMAL LOW (ref 8.9–10.3)
Creatinine, Ser: 1.68 mg/dL — ABNORMAL HIGH (ref 0.44–1.00)
GFR calc Af Amer: 29 mL/min — ABNORMAL LOW (ref >60)
GFR, EST NON AFRICAN AMERICAN: 25 mL/min — AB (ref >60)
GLUCOSE: 94 mg/dL (ref 65–99)
Potassium: 4.5 mmol/L (ref 3.5–5.1)
Sodium: 146 mmol/L — ABNORMAL HIGH (ref 135–145)
TOTAL PROTEIN: 6.3 g/dL — AB (ref 6.5–8.1)

## 2017-04-27 LAB — MRSA PCR SCREENING: MRSA BY PCR: NEGATIVE

## 2017-04-27 LAB — CBC WITH DIFFERENTIAL/PLATELET
BASOS PCT: 0 %
Basophils Absolute: 0 10*3/uL (ref 0.0–0.1)
EOS PCT: 0 %
Eosinophils Absolute: 0 10*3/uL (ref 0.0–0.7)
HEMATOCRIT: 26.8 % — AB (ref 36.0–46.0)
Hemoglobin: 8.3 g/dL — ABNORMAL LOW (ref 12.0–15.0)
Lymphocytes Relative: 15 %
Lymphs Abs: 1.1 10*3/uL (ref 0.7–4.0)
MCH: 32.4 pg (ref 26.0–34.0)
MCHC: 31 g/dL (ref 30.0–36.0)
MCV: 104.7 fL — ABNORMAL HIGH (ref 78.0–100.0)
MONO ABS: 0.7 10*3/uL (ref 0.1–1.0)
MONOS PCT: 10 %
NEUTROS ABS: 5.8 10*3/uL (ref 1.7–7.7)
Neutrophils Relative %: 75 %
PLATELETS: 202 10*3/uL (ref 150–400)
RBC: 2.56 MIL/uL — ABNORMAL LOW (ref 3.87–5.11)
RDW: 12.4 % (ref 11.5–15.5)
WBC: 7.7 10*3/uL (ref 4.0–10.5)

## 2017-04-27 LAB — HIV ANTIBODY (ROUTINE TESTING W REFLEX): HIV SCREEN 4TH GENERATION: NONREACTIVE

## 2017-04-27 LAB — STREP PNEUMONIAE URINARY ANTIGEN: STREP PNEUMO URINARY ANTIGEN: NEGATIVE

## 2017-04-27 MED ORDER — IPRATROPIUM-ALBUTEROL 0.5-2.5 (3) MG/3ML IN SOLN
3.0000 mL | Freq: Three times a day (TID) | RESPIRATORY_TRACT | Status: DC
  Administered 2017-04-27 – 2017-04-28 (×5): 3 mL via RESPIRATORY_TRACT
  Filled 2017-04-27 (×4): qty 3

## 2017-04-27 MED ORDER — PIPERACILLIN-TAZOBACTAM IN DEX 2-0.25 GM/50ML IV SOLN
2.2500 g | Freq: Four times a day (QID) | INTRAVENOUS | Status: DC
  Administered 2017-04-27 – 2017-04-29 (×8): 2.25 g via INTRAVENOUS
  Filled 2017-04-27 (×9): qty 50

## 2017-04-27 MED ORDER — ENSURE ENLIVE PO LIQD
237.0000 mL | Freq: Two times a day (BID) | ORAL | Status: DC
  Administered 2017-04-27 – 2017-04-29 (×5): 237 mL via ORAL

## 2017-04-27 NOTE — Progress Notes (Signed)
PROGRESS NOTE [facility]   Somaya Grassi   RDE:[id number] DOB: 10-20-1922  DOA: 04/26/2017 PCP: Sande Brothers, MD   Brief Narrative:  Annette Cantrell is a 84 year old female with medical history significant for COPD, hyperlipidemia, stroke, depression/anxiety, CAD, dementia and CKD stage III presented to the emergency department increasing confusion, nausea and vomiting.  Upon ED evaluation patient was found to have mild elevated WBC, elevated lactic acid, worsening renal function, hypertensive and chest x-ray showing right lower lobe infiltrate.  Patient was admitted with working diagnosis of pneumonia due to suspected aspiration  Subjective: Patient seen and examined, she has no complaints.  She is pleasantly demented.  Swallow evaluation ongoing during my examination and reporting signs of aspiration with thin liquids.  Assessment & Plan: Sepsis secondary to aspiration pneumonia Sepsis physiology has resolved Chest x-ray with right lower lobe infiltrate and pro-calcitonin elevated. Patient initially treated with vancomycin and Zosyn, vancomycin discontinue as MRSA is negative. Continue Zosyn for 24 hours if patient remains afebrile some clinical improvement can switch to Augmentin and treat for total of 7 days. Continue nebulizer treatments and Robitussin Follow blood cultures. D/c IV fluids SLP recommendations appreciated  Chronic systolic CHF EF of 63%, patient seems euvolemic, chest x-ray no signs of pulmonary edema, BNP at 700 Patient not on diuretics at home.  Monitor closely as patient received IV fluid for sepsis.  COPD Does not seems to have exacerbation, no wheezing on exam Continue nebulizer treatment as needed  Hypertension Upon admission BP 183/100, she was not on antihypertensive Patient was started on amlodipine 5 mg daily blood pressure has normalized Continue to monitor closely  Acute renal failure on chronic kidney disease stage III Prerenal  likely from dehydration, last recorded creatinine 1.25 on January 2018 Patient was hydrated aggressively, will hold on IV fluid, given history of systolic CHF, also patient is eating and drinking okay.  Check renal function in a.m.  Acute toxic encephalopathy in setting of dementia Secondary to pneumonia CT head negative Treat underlying causes  DVT prophylaxis: Lovenox Code Status: DNR Family Communication: None at bedside Disposition Plan: Home in 1-2 days if remains afebrile  Consultants:   none  Procedures:   None  Antimicrobials: Anti-infectives (From admission, onward)   Start     Dose/Rate Route Frequency Ordered Stop   04/27/17 1400  piperacillin-tazobactam (ZOSYN) IVPB 2.25 g     2.25 g 100 mL/hr over 30 Minutes Intravenous Every 6 hours 04/27/17 1329     04/27/17 0600  piperacillin-tazobactam (ZOSYN) IVPB 2.25 g  Status:  Discontinued     2.25 g 100 mL/hr over 30 Minutes Intravenous Every 8 hours 04/26/17 2137 04/27/17 1329   04/26/17 2200  vancomycin (VANCOCIN) 500 mg in sodium chloride 0.9 % 100 mL IVPB  Status:  Discontinued     500 mg 100 mL/hr over 60 Minutes Intravenous  Once 04/26/17 2131 04/26/17 2334   04/26/17 2130  vancomycin (VANCOCIN) IVPB 750 mg/150 ml premix  Status:  Discontinued     750 mg 150 mL/hr over 60 Minutes Intravenous  Once 04/26/17 2050 04/27/17 0040   04/26/17 2100  piperacillin-tazobactam (ZOSYN) IVPB 3.375 g     3.375 g 100 mL/hr over 30 Minutes Intravenous  Once 04/26/17 2051 04/26/17 2200   04/26/17 2030  ceFEPIme (MAXIPIME) 2 g in sodium chloride 0.9 % 100 mL IVPB  Status:  Discontinued     2 g 200 mL/hr over 30 Minutes Intravenous  Once 04/26/17 2018 04/26/17 2042  Objective: Vitals:   04/27/17 0559 04/27/17 0816 04/27/17 1441 04/27/17 1918  BP: (!) 156/72  124/63   Pulse: 79  70   Resp: 18  17   Temp: 99.1 F (37.3 C)  98.2 F (36.8 C)   TempSrc: Oral  Oral   SpO2: 96% 97% 98% 96%  Weight:  47.6 kg (104 lb  15 oz)    Height:  5' (1.524 m)      Intake/Output Summary (Last 24 hours) at 04/27/2017 1927 Last data filed at 04/27/2017 1800 Gross per 24 hour  Intake 3071.25 ml  Output 800 ml  Net 2271.25 ml   Filed Weights   04/26/17 2200 04/27/17 0816  Weight: 47.6 kg (104 lb 15 oz) 47.6 kg (104 lb 15 oz)    Examination:  General exam: NAD Respiratory system: Good air entry, right lower lobe rales Cardiovascular system: S1 & S2 heard, RRR. No JVD, murmurs, rubs or gallops Gastrointestinal system: Abdomen is nondistended, soft and nontender. No organomegaly or masses felt. Normal bowel sounds heard. Central nervous system: Alert, pleasantly confused.  Oriented to person only  Extremities: No pedal edema.  Skin: No rashes, lesions or ulcers  Data Reviewed: I have personally reviewed following labs and imaging studies  CBC: Recent Labs  Lab 04/26/17 1652 04/27/17 0941  WBC 11.5* 7.7  NEUTROABS 9.7* 5.8  HGB 9.4* 8.3*  HCT 30.2* 26.8*  MCV 104.1* 104.7*  PLT 243 440   Basic Metabolic Panel: Recent Labs  Lab 04/26/17 1652 04/27/17 0941  NA 147* 146*  K 4.4 4.5  CL 108 113*  CO2 23 21*  GLUCOSE 189* 94  BUN 49* 39*  CREATININE 1.69* 1.68*  CALCIUM 9.5 8.6*   GFR: Estimated Creatinine Clearance: 15.3 mL/min (A) (by C-G formula based on SCr of 1.68 mg/dL (H)). Liver Function Tests: Recent Labs  Lab 04/26/17 1652 04/27/17 0941  AST 28 19  ALT 13* 13*  ALKPHOS 96 75  BILITOT 0.6 0.7  PROT 7.9 6.3*  ALBUMIN 3.3* 2.7*   Recent Labs  Lab 04/26/17 1652  LIPASE 44   No results for input(s): AMMONIA in the last 168 hours. Coagulation Profile: No results for input(s): INR, PROTIME in the last 168 hours. Cardiac Enzymes: No results for input(s): CKTOTAL, CKMB, CKMBINDEX, TROPONINI in the last 168 hours. BNP (last 3 results) No results for input(s): PROBNP in the last 8760 hours. HbA1C: No results for input(s): HGBA1C in the last 72 hours. CBG: No results for  input(s): GLUCAP in the last 168 hours. Lipid Profile: No results for input(s): CHOL, HDL, LDLCALC, TRIG, CHOLHDL, LDLDIRECT in the last 72 hours. Thyroid Function Tests: No results for input(s): TSH, T4TOTAL, FREET4, T3FREE, THYROIDAB in the last 72 hours. Anemia Panel: No results for input(s): VITAMINB12, FOLATE, FERRITIN, TIBC, IRON, RETICCTPCT in the last 72 hours. Sepsis Labs: Recent Labs  Lab 04/26/17 2001 04/26/17 2215  PROCALCITON  --  0.79  LATICACIDVEN 3.28*  --     Recent Results (from the past 240 hour(s))  Blood culture (routine x 2)     Status: None (Preliminary result)   Collection Time: 04/26/17  8:17 PM  Result Value Ref Range Status   Specimen Description   Final    BLOOD LEFT ANTECUBITAL Performed at [facility] [address] [place], [place] [address]    Special Requests   Final    BOTTLES DRAWN AEROBIC AND ANAEROBIC Blood Culture adequate volume Performed at [facility]  [address] [place], [place] [address]    Culture   Final    NO GROWTH < 12 HOURS Performed at [facility] [address] [place], [place] [address]    Report Status PENDING  Incomplete  Blood culture (routine x 2)     Status: None (Preliminary result)   Collection Time: 04/26/17  8:51 PM  Result Value Ref Range Status   Specimen Description   Final    BLOOD RIGHT FOREARM Performed at [facility] [address] [place], [place] [address]    Special Requests   Final    BOTTLES DRAWN AEROBIC AND ANAEROBIC Blood Culture adequate volume Performed at [facility] [address], [place], [place] [address]    Culture   Final    NO GROWTH < 12 HOURS Performed at [facility] [address], [place], [place] [address]    Report Status PENDING  Incomplete  MRSA PCR Screening     Status: None   Collection Time: 04/26/17 11:30 PM  Result Value Ref Range Status   MRSA by PCR NEGATIVE  NEGATIVE Final    Comment:        The GeneXpert MRSA Assay (FDA approved for NASAL specimens only), is one component of a comprehensive MRSA colonization surveillance program. It is not intended to diagnose MRSA infection nor to guide or monitor treatment for MRSA infections. Performed at [facility], [place] [address] [place], [place] [address]       Radiology Studies: Ct Abdomen Pelvis Wo Contrast  Result Date: 04/26/2017 CLINICAL DATA:  Nausea and vomiting EXAM: CT ABDOMEN AND PELVIS WITHOUT CONTRAST TECHNIQUE: Multidetector CT imaging of the abdomen and pelvis was performed following the standard protocol without IV contrast. COMPARISON:  07/03/2013 FINDINGS: Lower chest: Lung bases demonstrate no significant pleural effusion. Coronary vascular calcification. Borderline to mild cardiomegaly. Small hiatal hernia. Patchy dependent atelectasis. Hepatobiliary: No focal hepatic abnormality. Probable small stones in the gallbladder. No biliary dilatation. Pancreas: Unremarkable. No pancreatic ductal dilatation or surrounding inflammatory changes. Spleen: Normal in size without focal abnormality. Adrenals/Urinary Tract: Adrenal glands are within normal limits. No hydronephrosis. Small stones versus intrarenal vascular calcification. Bladder partially obscured. Stomach/Bowel: Stomach nonenlarged. No dilated small bowel. Numerous left colon diverticula without acute wall thickening or inflammatory change. Negative appendix. Vascular/Lymphatic: Extensive aortic atherosclerotic vascular disease. Aneurysmal dilatation of the infrarenal abdominal aorta measuring up to 3.5 cm. No significantly enlarged lymph nodes. Reproductive: Calcified fibroids. Coarse calcifications in the left adnexal region are unchanged. Other: Negative for free air or free fluid. Musculoskeletal: Status post right hip replacement with associated artifact. 11 mm anterolisthesis L4 on L5. Advanced degenerative  changes of the lower lumbar spine. IMPRESSION: 1. Negative for bowel obstruction or bowel wall thickening 2. Extensive left colon diverticular disease without acute inflammation 3. Possible small stones in the gallbladder 4. Infrarenal abdominal aortic aneurysm measuring up to 3.5 cm. Recommend followup by ultrasound in 2 years. This recommendation follows ACR consensus guidelines: White Paper of the ACR Incidental Findings Committee II on Vascular Findings. J Am Coll Radiol 2013; 10:789-794. 5. Multiple calcified uterine fibroids. Stable calcified mass in the left adnexa. Electronically Signed   By: Donavan Foil M.D.   On: 04/26/2017 22:09   Dg Chest 2 View  Result Date: 04/26/2017 CLINICAL DATA:  Cough. EXAM: CHEST - 2 VIEW COMPARISON:  Chest x-ray dated January 14, 2016. CT chest dated March 23, 2008 FINDINGS: The patient is rotated to  the right. Stable cardiomediastinal silhouette. Normal pulmonary vascularity. Unchanged right upper lobe lung nodule. New mild patchy opacity in the right lower lobe. No focal consolidation, pleural effusion, or pneumothorax. No acute osseous abnormality. IMPRESSION: 1. New mild patchy opacity in the right lower lobe may reflect atelectasis versus infiltrate/aspiration. 2. Unchanged right upper lobe nodule. Electronically Signed   By: Titus Dubin M.D.   On: 04/26/2017 17:08   Ct Head Wo Contrast  Result Date: 04/26/2017 CLINICAL DATA:  84 year old hypertensive female with vomiting. History of dementia. Initial encounter. EXAM: CT HEAD WITHOUT CONTRAST TECHNIQUE: Contiguous axial images were obtained from the base of the skull through the vertex without intravenous contrast. COMPARISON:  02/09/2017 CT. FINDINGS: Brain: No intracranial hemorrhage or CT evidence of large acute infarct. Chronic microvascular changes. Moderate global atrophy. No intracranial mass lesion noted on this unenhanced exam. Vascular: Vascular calcifications Skull: No skull fracture Sinuses/Orbits:  No acute orbital abnormality. Visualized paranasal sinuses, mastoid air cells and middle ear cavities are clear. Other: Almost complete resolution of right frontal subcutaneous hematoma. IMPRESSION: No acute intracranial abnormality. Global atrophy. Chronic microvascular changes. Electronically Signed   By: Genia Del M.D.   On: 04/26/2017 18:21      Scheduled Meds: . amLODipine  5 mg Oral Daily  . divalproex  125 mg Oral QID  . feeding supplement (ENSURE ENLIVE)  237 mL Oral BID BM  . heparin injection (subcutaneous)  5,000 Units Subcutaneous Q12H  . ipratropium-albuterol  3 mL Nebulization TID  . mirtazapine  7.5 mg Oral QHS  . nicotine  21 mg Transdermal Daily  . OLANZapine zydis  5 mg Oral QHS  . sertraline  50 mg Oral Daily   Continuous Infusions: . sodium chloride 75 mL/hr at 04/27/17 1625  . piperacillin-tazobactam (ZOSYN)  IV Stopped (04/27/17 1412)     LOS: 1 day    Time spent: Total of 25 minutes spent with pt, greater than 50% of which was spent in discussion of  treatment, counseling and coordination of care  Chipper Oman, MD Pager: Text Page via [email or web address]   If 7PM-7AM, please contact night-coverage [email or web address] 04/27/2017, 7:27 PM   Note - This record has been created using [employer]. Chart creation errors have been sought, but may not always have been located. Such creation errors do not reflect on the standard of medical care.

## 2017-04-27 NOTE — Progress Notes (Signed)
Pharmacy Antibiotic Note  Annette Cantrell is a 84 y.o. female admitted on 04/26/2017 with pneumonia (possible aspiration). Pharmacy initially consulted for Vancomycin and Zosyn dosing. Vancomycin d/c'ed with negative MRSA PCR.   Plan:  Adjust Zosyn to 2.25g IV q6h for indication of pneumonia and CrCl < 20 ml/min.  Monitor renal function, cultures, clinical course.   Height: 5' (152.4 cm) Weight: 104 lb 15 oz (47.6 kg) IBW/kg (Calculated) : 45.5  Temp (24hrs), Avg:98.6 F (37 C), Min:97.4 F (36.3 C), Max:99.3 F (37.4 C)  Recent Labs  Lab 04/26/17 1652 04/26/17 2001 04/27/17 0941  WBC 11.5*  --  7.7  CREATININE 1.69*  --  1.68*  LATICACIDVEN  --  3.28*  --     Estimated Creatinine Clearance: 15.3 mL/min (A) (by C-G formula based on SCr of 1.68 mg/dL (H)).    No Known Allergies  Antimicrobials this admission: 4/22 Vancomycin x 1 4/22 Zosyn >>   Microbiology results: 4/22 BCx: NGTD 4/22 UCx: sent  4/22 MRSA PCR: neg Sputum: ordered  Thank you for allowing pharmacy to be a part of this patient's care.   Lindell Spar, PharmD, BCPS Pager: [phone] 04/27/2017 1:32 PM

## 2017-04-27 NOTE — Evaluation (Signed)
Clinical/Bedside Swallow Evaluation Patient Details  Name: Azana Kiesler MRN: [id number] Date of Birth: [birth year]  Today's Date: 04/27/2017 Time: SLP Start Time (ACUTE ONLY): 1025 SLP Stop Time (ACUTE ONLY): 1058 SLP Time Calculation (min) (ACUTE ONLY): 33 min  Past Medical History:  Past Medical History:  Diagnosis Date  . CAD (coronary artery disease)   . Chronic systolic heart failure ([place])   . COPD 03/30/2008   Qualifier: Diagnosis of  By: [name] MD, [name]   . Dementia   . Heart disease, unspecified   . Herpes zoster 01/30/2012  . High cholesterol   . History of positive PPD, untreated   . Infected sebaceous cyst 01/25/2012  . LUNG NODULE 03/30/2008   Qualifier: Diagnosis of  By: [name] MD, [name]   . Stroke [facility])    Past Surgical History:  Past Surgical History:  Procedure Laterality Date  . HIP ARTHROPLASTY  01/31/2012   Procedure: ARTHROPLASTY BIPOLAR HIP;  Surgeon: Rozanna Box, MD;  Location: [facility];  Service: Orthopedics;  Laterality: Right;   HPI:  84 yo female adm to [facility] with sepsis, nausea/vomiting - diagnosed with right lower lobe pna.  PMH + for COPD, smoker, stroke.  CXR showed right lower lobe infiltrate vs ATX.  CT head negative.  Swallow eval ordered - pt is a hospice pt.    Assessment / Plan / Recommendation Clinical Impression  Patient demonstrating clinical indication of aspiration with thin via straw c/b strong cough immediate post large bolus swallow.  Suspect loss of bolus into open larynx prior to swallow triggering.   Pt tolerated small boluses of thin via cup well - as well as solids.  She does admit to occasional coughing with thin liquids.  Suspect pt may intermittently aspirate thin due to oral deficits allowing spilage into airway. However given pt also with vomiting prior to admit, this may contribute to aspiraton pna.  Recommend regular/thin diet with aspiration precautions. Will follow up x1 to assure tolerance, educate family to dysphagia  with dementia and progression.  Thanks for allowing me to help care for this pt.   SLP Visit Diagnosis: Dysphagia, oral phase (R13.11)    Aspiration Risk  Mild aspiration risk    Diet Recommendation Regular;Thin liquid   Liquid Administration via: Cup;No straw Medication Administration: (as tolerated, with puree if problematic) Supervision: Patient able to self feed Compensations: Slow rate;Small sips/bites Postural Changes: Seated upright at 90 degrees;Remain upright for at least 30 minutes after po intake    Other  Recommendations Oral Care Recommendations: Oral care BID   Follow up Recommendations        Frequency and Duration min 1 x/week  1 week       Prognosis Prognosis for Safe Diet Advancement: Fair Barriers to Reach Goals: Other (Comment);Cognitive deficits(medical dx)      Swallow Study   General Date of Onset: 04/27/17 HPI: 84 yo female adm to [facility] with sepsis, nausea/vomiting - diagnosed with right lower lobe pna.  PMH + for COPD, smoker, stroke.  CXR showed right lower lobe infiltrate vs ATX.  CT head negative.  Swallow eval ordered - pt is a hospice pt.  Type of Study: Bedside Swallow Evaluation Diet Prior to this Study: NPO(x meds) Temperature Spikes Noted: Yes(99.3 low grade) Respiratory Status: Room air History of Recent Intubation: No Behavior/Cognition: Alert;Cooperative;Pleasant mood Oral Cavity Assessment: Within Functional Limits Oral Care Completed by SLP: No Oral Cavity - Dentition: Adequate natural dentition Vision: Functional for self-feeding Self-Feeding Abilities: Able to  feed self Patient Positioning: Upright in bed Baseline Vocal Quality: Normal Volitional Cough: Strong Volitional Swallow: Unable to elicit    Oral/Motor/Sensory Function Overall Oral Motor/Sensory Function: Generalized oral weakness   Ice Chips Ice chips: Not tested   Thin Liquid Thin Liquid: Impaired Presentation: Cup;Straw;Self Fed Pharyngeal  Phase Impairments: Cough  - Immediate Other Comments: immediate post = swallow coughing with thin via straw- consistently occuring with 5/5 swallows - suspect anterior loss of bolus into pharynx prior to swallow trigger, small boluses via cup tolerated well     Nectar Thick Nectar Thick Liquid: Within functional limits Presentation: Cup;Straw   Honey Thick Honey Thick Liquid: Not tested   Puree Puree: Within functional limits   Solid   GO   Solid: Within functional limits        [name] 04/27/2017,11:08 AM   [name], [facility] SLP [phone]

## 2017-04-27 NOTE — Progress Notes (Signed)
Initial Nutrition Assessment  DOCUMENTATION CODES:   Not applicable  INTERVENTION:    Ensure Enlive po BID, each supplement provides 350 kcal and 20 grams of protein  Magic cup TID with meals, each supplement provides 290 kcal and 9 grams of protein  NUTRITION DIAGNOSIS:   Increased nutrient needs related to acute illness as evidenced by estimated needs.  GOAL:   Patient will meet greater than or equal to 90% of their needs  MONITOR:   Supplement acceptance, PO intake, Diet advancement, Weight trends, Labs  REASON FOR ASSESSMENT:   Consult Assessment of nutrition requirement/status  ASSESSMENT:   Patient with PMH significant for COPD, HLD, stroke, depression with anxiety, CAD, sCHF with EF 45%, dementia, tobacco abuse, and CKD-III. Presents this admission with altered mental status, nausea, vomiting and cough. Found to be septic with possible aspiration PNA.   No family at bedside to provide nutrition related history. SLP saw pt 4/23. Pt was found to be a mild aspiration risk. They suspect vomit PTA could have contributed to aspiration and recommend a regular diet with thin liquids.  RD observed graham crackers and ensure at bedside. Nurse reports pt does better without a straw. RD to order Ensure and [employer].  Records are limited in weight history but show pt has maintained a lower weight of 93-104 lb since 2015. Will attempt to speak with family to obtain more weight history if possible.  Nutrition-Focused physical exam completed. Suspect muscle depletions are a result of advanced age and possibly poor intake. Pt may have some form of malnutrition at this time but it's difficult to diagnosis without recent history.   Note: [facility] before and graduated each time. Family would like pt to return to ALF.   Medications reviewed and include: Remeron, NS @ 75 ml/hr, IV abx Labs reviewed: Na 146 (H)   NUTRITION - FOCUSED PHYSICAL EXAM:    Most Recent  Value  Orbital Region  Mild depletion  Upper Arm Region  Severe depletion  Thoracic and Lumbar Region  Severe depletion  Buccal Region  Moderate depletion  Temple Region  Moderate depletion  Clavicle Bone Region  Severe depletion  Clavicle and Acromion Bone Region  Severe depletion  Scapular Bone Region  Severe depletion  Dorsal Hand  Mild depletion  Patellar Region  Moderate depletion  Anterior Thigh Region  Moderate depletion  Posterior Calf Region  Moderate depletion  Edema (RD Assessment)  None     Diet Order:  Diet regular Room service appropriate? Yes; Fluid consistency: Thin  EDUCATION NEEDS:   Not appropriate for education at this time  Skin:  Skin Assessment: Reviewed RN Assessment  Last BM:  PTA  Height:   Ht Readings from Last 1 Encounters:  04/27/17 5' (1.524 m)    Weight:   Wt Readings from Last 1 Encounters:  04/27/17 104 lb 15 oz (47.6 kg)    Ideal Body Weight:  45.5 kg  BMI:  Body mass index is 20.49 kg/m.  Estimated Nutritional Needs:   Kcal:  1250-1450 kcal  Protein:  60-70 g  Fluid:  >1.2 L/day    Mariana Single RD, LDN Clinical Nutrition Pager # [phone]

## 2017-04-27 NOTE — Clinical Social Work Note (Addendum)
Clinical Social Work Assessment  Patient Details  Name: Annette Cantrell MRN: [id number] Date of Birth: 08-06-1922  Date of referral:  04/27/17               Reason for consult:  (admitted from facility)                Permission sought to share information with:  Family Supports, Customer service manager Permission granted to share information::     Name::     granddaughter [name]  Agency::  [facility]  Relationship::     Contact Information:     Housing/Transportation Living arrangements for the past 2 months:  [place] of Information:  (granddaughter) Patient Interpreter Needed:  None Criminal Activity/Legal Involvement Pertinent to Current Situation/Hospitalization:  No - Comment as needed Significant Relationships:  Warehouse manager, Other Family Members Lives with:  Facility Resident Do you feel safe going back to the place where you live?  Yes Need for family participation in patient care:  Yes (Comment)(pt with dementia, granddaughter is Media planner)  Care giving concerns:  Pt admitted from ALF where she has resided for 3 years. Granddaughter reports pt needs maximum assistance there, sometimes can feed self but otherwise totally dependent. Has dementia and is not oriented except to self at baseline per granddaughter. Can sometimes recognize caregivers. Currently admitted for sepsis, pneumonia, nausea/vomiting.   Of note, pt's granddaughter states she has enlisted hospice services twice in the past and pt graduated from hospice care both times. Currently not under hospice care.   Social Worker assessment / plan:  CSW consulted to assist with disposition as pt is admitted from facility- [facility]. Has resided there since 2016 and is dependent with ambulating (uses wheelchair and needs assistance to transfer into it) and with ADLs except eating. Pt's granddaughter reports she is pleased with care and hopes for pt to return  at [place] left voicemail for [facility] to coordinate. Will need updated FL2 for facility at [place]. Plan: Return to ALF at [place].  Employment status:  Retired Forensic scientist:  Information systems manager, Medicaid In [place] PT Recommendations:  Not assessed at this time Information / Referral to community resources:     Patient/Family's Response to care:  Production manager of care, asks appropriate questions about her status and treatment   Patient/Family's Understanding of and Emotional Response to Diagnosis, Current Treatment, and Prognosis:  Pt unable to demonstrate understanding- consistent with dementia. Granddaughter has good understanding of treatment and plan. Is good historian of pt's care. Explains hx of pt's living indpendently until pt broke hip several years ago and moved in with daughter. Daughter died of cancer and at that time granddaughter facilitated pt moving into ALF. Eventually into locked unit due to progressing dementia. Granddaughter was emotionally well-adjusted and supportive of pt.   Emotional Assessment Appearance:  Appears stated age Attitude/Demeanor/Rapport:  (confused) Affect (typically observed):  Calm Orientation:  Oriented to Self Alcohol / Substance use:  Not Applicable Psych involvement (Current and /or in the community):  No (Comment)  Discharge Needs  Concerns to be addressed:  Care Coordination Readmission within the last 30 days:  No Current discharge risk:  None Barriers to Discharge:  Continued Medical Work up   [employer], LCSW 04/27/2017, 12:46 PM [phone]

## 2017-04-28 ENCOUNTER — Inpatient Hospital Stay (HOSPITAL_COMMUNITY): Payer: Medicare Other

## 2017-04-28 LAB — CBC
HEMATOCRIT: 22.7 % — AB (ref 36.0–46.0)
Hemoglobin: 7 g/dL — ABNORMAL LOW (ref 12.0–15.0)
MCH: 32.6 pg (ref 26.0–34.0)
MCHC: 30.8 g/dL (ref 30.0–36.0)
MCV: 105.6 fL — AB (ref 78.0–100.0)
PLATELETS: 203 10*3/uL (ref 150–400)
RBC: 2.15 MIL/uL — ABNORMAL LOW (ref 3.87–5.11)
RDW: 12.5 % (ref 11.5–15.5)
WBC: 5.5 10*3/uL (ref 4.0–10.5)

## 2017-04-28 LAB — BASIC METABOLIC PANEL
Anion gap: 10 (ref 5–15)
BUN: 36 mg/dL — AB (ref 6–20)
CHLORIDE: 113 mmol/L — AB (ref 101–111)
CO2: 23 mmol/L (ref 22–32)
Calcium: 8.7 mg/dL — ABNORMAL LOW (ref 8.9–10.3)
Creatinine, Ser: 1.69 mg/dL — ABNORMAL HIGH (ref 0.44–1.00)
GFR calc Af Amer: 29 mL/min — ABNORMAL LOW (ref >60)
GFR calc non Af Amer: 25 mL/min — ABNORMAL LOW (ref >60)
GLUCOSE: 88 mg/dL (ref 65–99)
Potassium: 4.2 mmol/L (ref 3.5–5.1)
SODIUM: 146 mmol/L — AB (ref 135–145)

## 2017-04-28 LAB — LACTIC ACID, PLASMA: Lactic Acid, Venous: 0.7 mmol/L (ref 0.5–1.9)

## 2017-04-28 LAB — URINE CULTURE

## 2017-04-28 LAB — MAGNESIUM: Magnesium: 1.9 mg/dL (ref 1.7–2.4)

## 2017-04-28 LAB — LEGIONELLA PNEUMOPHILA SEROGP 1 UR AG: L. pneumophila Serogp 1 Ur Ag: NEGATIVE

## 2017-04-28 LAB — ABO/RH: ABO/RH(D): O POS

## 2017-04-28 LAB — PREPARE RBC (CROSSMATCH)

## 2017-04-28 MED ORDER — SODIUM CHLORIDE 0.9 % IV SOLN
Freq: Once | INTRAVENOUS | Status: AC
  Administered 2017-04-28: 18:00:00 via INTRAVENOUS

## 2017-04-28 MED ORDER — IPRATROPIUM-ALBUTEROL 0.5-2.5 (3) MG/3ML IN SOLN
3.0000 mL | Freq: Two times a day (BID) | RESPIRATORY_TRACT | Status: DC
  Administered 2017-04-29: 3 mL via RESPIRATORY_TRACT
  Filled 2017-04-28 (×2): qty 3

## 2017-04-28 NOTE — Progress Notes (Signed)
Patients hemoglobin this AM is 7. Tylene Fantasia, NP made aware.

## 2017-04-28 NOTE — Progress Notes (Signed)
PROGRESS NOTE    Annette Cantrell  HAL:[id number] DOB: January 10, 1922 DOA: 04/26/2017 PCP: Sande Brothers, MD    Brief Narrative:  Annette Cantrell is a 84 year old female with medical history significant for COPD, hyperlipidemia, stroke, depression/anxiety, CAD, dementia and CKD stage III presented to the emergency department increasing confusion, nausea and vomiting.  Upon ED evaluation patient was found to have mild elevated WBC, elevated lactic acid, worsening renal function, hypertensive and chest x-ray showing right lower lobe infiltrate.  Patient was admitted with working diagnosis of pneumonia due to suspected aspiration    Assessment & Plan:   Principal Problem:   Sepsis ([place]) Active Problems:   Tobacco abuse   HTN (hypertension)   Anemia   Depression   COPD (chronic obstructive pulmonary disease) (HCC)   Protein-calorie malnutrition, severe (HCC)   Acute metabolic encephalopathy   Aspiration pneumonia (HCC)   Nausea & vomiting   Acute renal failure superimposed on stage 3 chronic kidney disease (HCC)   Chronic systolic CHF (congestive heart failure) ([place])   Sepsis secondary to aspiration pneumonia:  - resume zosyn , transition to oral augmentin to completed the course.  - continue with nebs and robitussin.  - Blood cultures are negative so far.  - slp recommendations given.   COPD: No wheezing heard.   Hypertension:  Well controlled.    Acute on Stage 3 CKD: POSSIBLY pre renal.  Creatinine improved.   Chronic systolic heart failure: Euvolemic.  cxr  Is negative for pulm edema.    Acute toxic encephalopathy: sec to pneumonia and dementia: Much improved. Pt appears to be back at baseline.   DVT prophylaxis: (HEPARIN) Code Status: (DNR.  Family Communication: none at bedside.  Disposition Plan: possible discharge in 1 to 2 days.    Consultants:   SLP   Procedures: NONE.    Antimicrobials: zosyn since admission.    Subjective: Reports  feeling better.   Objective: Vitals:   04/27/17 2229 04/28/17 0603 04/28/17 0745 04/28/17 1504  BP: 127/64 (!) 156/71  (!) 156/68  Pulse: 72 61  65  Resp: 18 18  19   Temp: 99.2 F (37.3 C) (!) 97.4 F (36.3 C)  98 F (36.7 C)  TempSrc: Oral Oral  Oral  SpO2: 98% 98% 98% 93%  Weight:      Height:        Intake/Output Summary (Last 24 hours) at 04/28/2017 1708 Last data filed at 04/28/2017 1504 Gross per 24 hour  Intake 805 ml  Output 750 ml  Net 55 ml   Filed Weights   04/26/17 2200 04/27/17 0816  Weight: 47.6 kg (104 lb 15 oz) 47.6 kg (104 lb 15 oz)    Examination:  General exam: Appears calm and comfortable  Respiratory system: Clear to auscultation. Respiratory effort normal. Cardiovascular system: S1 & S2 heard, RRR. No JVD, murmurs, rubs, gallops or clicks. No pedal edema. Gastrointestinal system: Abdomen is nondistended, soft and nontender. No organomegaly or masses felt. Normal bowel sounds heard. Central nervous system: Alert answering questions appropriately.  Extremities: Symmetric 5 x 5 power. Skin: No rashes, lesions or ulcers Psychiatry: . Mood & affect appropriate.     Data Reviewed: I have personally reviewed following labs and imaging studies  CBC: Recent Labs  Lab 04/26/17 1652 04/27/17 0941 04/28/17 0426  WBC 11.5* 7.7 5.5  NEUTROABS 9.7* 5.8  --   HGB 9.4* 8.3* 7.0*  HCT 30.2* 26.8* 22.7*  MCV 104.1* 104.7* 105.6*  PLT 243 202 735   Basic Metabolic  Panel: Recent Labs  Lab 04/26/17 1652 04/27/17 0941 04/28/17 0426  NA 147* 146* 146*  K 4.4 4.5 4.2  CL 108 113* 113*  CO2 23 21* 23  GLUCOSE 189* 94 88  BUN 49* 39* 36*  CREATININE 1.69* 1.68* 1.69*  CALCIUM 9.5 8.6* 8.7*  MG  --   --  1.9   GFR: Estimated Creatinine Clearance: 15.3 mL/min (A) (by C-G formula based on SCr of 1.69 mg/dL (H)). Liver Function Tests: Recent Labs  Lab 04/26/17 1652 04/27/17 0941  AST 28 19  ALT 13* 13*  ALKPHOS 96 75  BILITOT 0.6 0.7  PROT 7.9  6.3*  ALBUMIN 3.3* 2.7*   Recent Labs  Lab 04/26/17 1652  LIPASE 44   No results for input(s): AMMONIA in the last 168 hours. Coagulation Profile: No results for input(s): INR, PROTIME in the last 168 hours. Cardiac Enzymes: No results for input(s): CKTOTAL, CKMB, CKMBINDEX, TROPONINI in the last 168 hours. BNP (last 3 results) No results for input(s): PROBNP in the last 8760 hours. HbA1C: No results for input(s): HGBA1C in the last 72 hours. CBG: No results for input(s): GLUCAP in the last 168 hours. Lipid Profile: No results for input(s): CHOL, HDL, LDLCALC, TRIG, CHOLHDL, LDLDIRECT in the last 72 hours. Thyroid Function Tests: No results for input(s): TSH, T4TOTAL, FREET4, T3FREE, THYROIDAB in the last 72 hours. Anemia Panel: No results for input(s): VITAMINB12, FOLATE, FERRITIN, TIBC, IRON, RETICCTPCT in the last 72 hours. Sepsis Labs: Recent Labs  Lab 04/26/17 2001 04/26/17 2215  PROCALCITON  --  0.79  LATICACIDVEN 3.28*  --     Recent Results (from the past 240 hour(s))  Blood culture (routine x 2)     Status: None (Preliminary result)   Collection Time: 04/26/17  8:17 PM  Result Value Ref Range Status   Specimen Description   Final    BLOOD LEFT ANTECUBITAL Performed at [facility] [address] [place], [place] [address]    Special Requests   Final    BOTTLES DRAWN AEROBIC AND ANAEROBIC Blood Culture adequate volume Performed at [facility] [address], [place], [place] [address]    Culture   Final    NO GROWTH 2 DAYS Performed at [facility] [address], [place], [place] [address]    Report Status PENDING  Incomplete  Urine culture     Status: Abnormal   Collection Time: 04/26/17  8:51 PM  Result Value Ref Range Status   Specimen Description   Final    URINE, CLEAN CATCH Performed at [facility], [place] [address], [place], [place] [address]    Special Requests   Final     NONE Performed at [facility], [place] [address] [place], [place] [address]    Culture MULTIPLE SPECIES PRESENT, SUGGEST RECOLLECTION (A)  Final   Report Status 04/28/2017 FINAL  Final  Blood culture (routine x 2)     Status: None (Preliminary result)   Collection Time: 04/26/17  8:51 PM  Result Value Ref Range Status   Specimen Description   Final    BLOOD RIGHT FOREARM Performed at [facility] [address] [place], [place] [address]    Special Requests   Final    BOTTLES DRAWN AEROBIC AND ANAEROBIC Blood Culture adequate volume Performed at [facility] [address] [place], [place] [address]    Culture   Final    NO  GROWTH 2 DAYS Performed at [facility], [place] [address], [place], [place] [address]    Report Status PENDING  Incomplete  MRSA PCR Screening     Status: None   Collection Time: 04/26/17 11:30 PM  Result Value Ref Range Status   MRSA by PCR NEGATIVE NEGATIVE Final    Comment:        The GeneXpert MRSA Assay (FDA approved for NASAL specimens only), is one component of a comprehensive MRSA colonization surveillance program. It is not intended to diagnose MRSA infection nor to guide or monitor treatment for MRSA infections. Performed at [facility], [place] [address] [place], [place] [address]          Radiology Studies: Ct Abdomen Pelvis Wo Contrast  Result Date: 04/26/2017 CLINICAL DATA:  Nausea and vomiting EXAM: CT ABDOMEN AND PELVIS WITHOUT CONTRAST TECHNIQUE: Multidetector CT imaging of the abdomen and pelvis was performed following the standard protocol without IV contrast. COMPARISON:  07/03/2013 FINDINGS: Lower chest: Lung bases demonstrate no significant pleural effusion. Coronary vascular calcification. Borderline to mild cardiomegaly. Small hiatal hernia. Patchy dependent atelectasis. Hepatobiliary: No focal hepatic abnormality. Probable small stones in the  gallbladder. No biliary dilatation. Pancreas: Unremarkable. No pancreatic ductal dilatation or surrounding inflammatory changes. Spleen: Normal in size without focal abnormality. Adrenals/Urinary Tract: Adrenal glands are within normal limits. No hydronephrosis. Small stones versus intrarenal vascular calcification. Bladder partially obscured. Stomach/Bowel: Stomach nonenlarged. No dilated small bowel. Numerous left colon diverticula without acute wall thickening or inflammatory change. Negative appendix. Vascular/Lymphatic: Extensive aortic atherosclerotic vascular disease. Aneurysmal dilatation of the infrarenal abdominal aorta measuring up to 3.5 cm. No significantly enlarged lymph nodes. Reproductive: Calcified fibroids. Coarse calcifications in the left adnexal region are unchanged. Other: Negative for free air or free fluid. Musculoskeletal: Status post right hip replacement with associated artifact. 11 mm anterolisthesis L4 on L5. Advanced degenerative changes of the lower lumbar spine. IMPRESSION: 1. Negative for bowel obstruction or bowel wall thickening 2. Extensive left colon diverticular disease without acute inflammation 3. Possible small stones in the gallbladder 4. Infrarenal abdominal aortic aneurysm measuring up to 3.5 cm. Recommend followup by ultrasound in 2 years. This recommendation follows ACR consensus guidelines: White Paper of the ACR Incidental Findings Committee II on Vascular Findings. J Am Coll Radiol 2013; 10:789-794. 5. Multiple calcified uterine fibroids. Stable calcified mass in the left adnexa. Electronically Signed   By: Donavan Foil M.D.   On: 04/26/2017 22:09   Dg Thoracic Spine 2 View  Result Date: 04/28/2017 CLINICAL DATA:  Upper back pain EXAM: THORACIC SPINE 2 VIEWS COMPARISON:  04/26/2017 FINDINGS: Mild scoliosis is noted concave to the right centered in the mid to lower thoracic spine. This may be in part positional in nature. No compression deformities are noted. Mild  degenerative changes of the thoracic spine are noted. No paraspinal mass is seen. Patchy infiltrates are seen similar to that noted on recent chest x-ray. IMPRESSION: Scoliosis without acute bony abnormality. Electronically Signed   By: Inez Catalina M.D.   On: 04/28/2017 16:10   Ct Head Wo Contrast  Result Date: 04/26/2017 CLINICAL DATA:  84 year old hypertensive female with vomiting. History of dementia. Initial encounter. EXAM: CT HEAD WITHOUT CONTRAST TECHNIQUE: Contiguous axial images were obtained from the base of the skull through the vertex without intravenous contrast. COMPARISON:  02/09/2017 CT. FINDINGS: Brain: No intracranial hemorrhage or CT evidence of large acute infarct. Chronic microvascular changes. Moderate global atrophy. No intracranial mass lesion noted on this  unenhanced exam. Vascular: Vascular calcifications Skull: No skull fracture Sinuses/Orbits: No acute orbital abnormality. Visualized paranasal sinuses, mastoid air cells and middle ear cavities are clear. Other: Almost complete resolution of right frontal subcutaneous hematoma. IMPRESSION: No acute intracranial abnormality. Global atrophy. Chronic microvascular changes. Electronically Signed   By: Genia Del M.D.   On: 04/26/2017 18:21        Scheduled Meds: . amLODipine  5 mg Oral Daily  . divalproex  125 mg Oral QID  . feeding supplement (ENSURE ENLIVE)  237 mL Oral BID BM  . heparin injection (subcutaneous)  5,000 Units Subcutaneous Q12H  . ipratropium-albuterol  3 mL Nebulization TID  . mirtazapine  7.5 mg Oral QHS  . nicotine  21 mg Transdermal Daily  . OLANZapine zydis  5 mg Oral QHS  . sertraline  50 mg Oral Daily   Continuous Infusions: . piperacillin-tazobactam (ZOSYN)  IV Stopped (04/28/17 1430)     LOS: 2 days    Time spent: 35 minutes.     Hosie Poisson, MD [facility] Pager [phone]  If 7PM-7AM, please contact night-coverage [email or web address] Password [facility] 04/28/2017, 5:08 PM

## 2017-04-29 LAB — CBC WITH DIFFERENTIAL/PLATELET
Basophils Absolute: 0 10*3/uL (ref 0.0–0.1)
Basophils Relative: 0 %
EOS ABS: 0.4 10*3/uL (ref 0.0–0.7)
Eosinophils Relative: 5 %
HEMATOCRIT: 36.6 % (ref 36.0–46.0)
HEMOGLOBIN: 12.1 g/dL (ref 12.0–15.0)
LYMPHS ABS: 1.4 10*3/uL (ref 0.7–4.0)
Lymphocytes Relative: 19 %
MCH: 31.2 pg (ref 26.0–34.0)
MCHC: 33.1 g/dL (ref 30.0–36.0)
MCV: 94.3 fL (ref 78.0–100.0)
MONOS PCT: 10 %
Monocytes Absolute: 0.7 10*3/uL (ref 0.1–1.0)
NEUTROS ABS: 4.9 10*3/uL (ref 1.7–7.7)
NEUTROS PCT: 66 %
Platelets: 225 10*3/uL (ref 150–400)
RBC: 3.88 MIL/uL (ref 3.87–5.11)
RDW: 19.6 % — ABNORMAL HIGH (ref 11.5–15.5)
WBC: 7.3 10*3/uL (ref 4.0–10.5)

## 2017-04-29 LAB — RETICULOCYTES
RBC.: 3.93 MIL/uL (ref 3.87–5.11)
RETIC CT PCT: 1.4 % (ref 0.4–3.1)
Retic Count, Absolute: 55 10*3/uL (ref 19.0–186.0)

## 2017-04-29 LAB — BASIC METABOLIC PANEL
Anion gap: 10 (ref 5–15)
BUN: 32 mg/dL — AB (ref 6–20)
CHLORIDE: 110 mmol/L (ref 101–111)
CO2: 22 mmol/L (ref 22–32)
CREATININE: 1.69 mg/dL — AB (ref 0.44–1.00)
Calcium: 8.9 mg/dL (ref 8.9–10.3)
GFR calc Af Amer: 29 mL/min — ABNORMAL LOW (ref >60)
GFR calc non Af Amer: 25 mL/min — ABNORMAL LOW (ref >60)
GLUCOSE: 76 mg/dL (ref 65–99)
Potassium: 4.6 mmol/L (ref 3.5–5.1)
Sodium: 142 mmol/L (ref 135–145)

## 2017-04-29 MED ORDER — AMLODIPINE BESYLATE 5 MG PO TABS: 5.0000 mg | ORAL_TABLET | Freq: Every day | ORAL | 0 refills | Status: DC

## 2017-04-29 MED ORDER — AMOXICILLIN-POT CLAVULANATE 250-62.5 MG/5ML PO SUSR: 500.0000 mg | Freq: Two times a day (BID) | ORAL | 0 refills | Status: AC

## 2017-04-29 MED ORDER — AMOXICILLIN-POT CLAVULANATE 250-62.5 MG/5ML PO SUSR
500.0000 mg | Freq: Two times a day (BID) | ORAL | Status: DC
  Administered 2017-04-29: 500 mg via ORAL
  Filled 2017-04-29 (×2): qty 10

## 2017-04-29 NOTE — Progress Notes (Signed)
SLP Cancellation Note  Patient Details Name: Annette Cantrell MRN: [id number] DOB: [birth year]    Cancelled treatment:       Reason Eval/Treat Not Completed: Other (comment)(pt working with NT to get cleaned up at this time) will continue efforts   [name], [facility] SLP [phone]    [name] 04/29/2017, 11:04 AM

## 2017-04-29 NOTE — Discharge Summary (Signed)
Physician Discharge Summary  Annette Cantrell VXY:[id number] DOB: 18-Oct-1922 DOA: 04/26/2017  PCP: Annette Brothers, MD  Admit date: 04/26/2017 Discharge date: 04/29/2017  Admitted From: ALF Disposition: ALF  Recommendations for Outpatient Follow-up:  1. Follow up with PCP in 1-2 weeks 2. Please obtain BMP/CBC in one week 3. Please follow up WITH [place] [facility] for abd aneurysm in 2 years.     Discharge Condition:stable.  CODE STATUS: DNR Diet recommendation: Heart Healthy  Brief/Interim Summary:  Annette Cantrell a 84 year old female with medical history significant for COPD, hyperlipidemia, stroke, depression/anxiety, CAD, dementia and CKD stage III presented to the emergency department increasing confusion, nausea and vomiting. Upon ED evaluation patient was found to have mild elevated WBC, elevated lactic acid, worsening renal function, hypertensive and chest x-ray showing right lower lobe infiltrate. Patient was admitted with working diagnosis of pneumonia due to suspected aspiration   Discharge Diagnoses:  Principal Problem:   Sepsis ([place]) Active Problems:   Tobacco abuse   HTN (hypertension)   Anemia   Depression   COPD (chronic obstructive pulmonary disease) (HCC)   Protein-calorie malnutrition, severe (HCC)   Acute metabolic encephalopathy   Aspiration pneumonia (HCC)   Nausea & vomiting   Acute renal failure superimposed on stage 3 chronic kidney disease (HCC)   Chronic systolic CHF (congestive heart failure) ([place])  Sepsis secondary to aspiration pneumonia:  - resume zosyn , transition to oral augmentin to completed the course.  - continue with nebs and robitussin.  - Blood cultures are negative so far.  - slp recommendations given.   COPD: No wheezing heard.   Hypertension:  Well controlled.    Acute on Stage 3 CKD: POSSIBLY pre renal.  Creatinine improved and appears to be back to baseline.   Chronic systolic heart failure: Euvolemic.  cxr  Is  negative for pulm edema.    Acute toxic encephalopathy: sec to pneumonia and dementia: Much improved. Pt appears to be back at baseline.    Anemia of chronic disease:  Hemoglobin fluctuating between 9 to 7. Anemia panel ordered .  No signs of bleeding.  2 units of prbc transfusion ordered and repeat H&H is around 12.  Recommend outpatient follow up with PCP regarding hemoglobin.     Discharge Instructions  Discharge Instructions    Diet - low sodium heart healthy   Complete by:  As directed    Discharge instructions   Complete by:  As directed    Please follow up with PCP in one week.  Please check CXR in 4 weeks to evaluate for resolution of the pneumonia.     Allergies as of 04/29/2017   No Known Allergies     Medication List    STOP taking these medications   predniSONE 20 MG tablet Commonly known as:  DELTASONE     TAKE these medications   acetaminophen 325 MG tablet Commonly known as:  TYLENOL Take 650 mg by mouth every morning.   albuterol (2.5 MG/3ML) 0.083% nebulizer solution Commonly known as:  PROVENTIL Inhale 3 mLs into the lungs every 4 (four) hours as needed for wheezing or shortness of breath.   amLODipine 5 MG tablet Commonly known as:  NORVASC Take 1 tablet (5 mg total) by mouth daily. Start taking on:  04/30/2017   amoxicillin-clavulanate 250-62.5 MG/5ML suspension Commonly known as:  AUGMENTIN Take 10 mLs (500 mg total) by mouth every 12 (twelve) hours for 5 days. Start taking on:  04/30/2017   divalproex 125 MG capsule Commonly known as:  DEPAKOTE SPRINKLE Take 125 mg by mouth 4 (four) times daily.   guaifenesin 100 MG/5ML syrup Commonly known as:  ROBITUSSIN Take 200 mg by mouth 4 (four) times daily as needed for cough.   hydrocortisone 2.5 % ointment Apply 1 application topically every 6 (six) hours as needed (pain).   ipratropium-albuterol 0.5-2.5 (3) MG/3ML Soln Commonly known as:  DUONEB Take 3 mLs by nebulization every 6  (six) hours as needed (sob and cough).   LORazepam 0.5 MG tablet Commonly known as:  ATIVAN Take 0.5 mg by mouth every 4 (four) hours as needed for anxiety.   magnesium hydroxide 400 MG/5ML suspension Commonly known as:  MILK OF MAGNESIA Take 30 mLs by mouth daily as needed for mild constipation.   [place] 200-200-20 MG/5ML suspension Generic drug:  alum & mag hydroxide-simeth Take 30 mLs by mouth every 6 (six) hours as needed for indigestion or heartburn.   mirtazapine 7.5 MG tablet Commonly known as:  REMERON Take 7.5 mg by mouth at bedtime.   NUTRITIONAL SUPPLEMENT PO Take 1 Container by mouth 3 (three) times daily with meals. Great Shakes   OLANZapine zydis 5 MG disintegrating tablet Commonly known as:  ZYPREXA Take 1 tablet (5 mg total) by mouth at bedtime.   sertraline 50 MG tablet Commonly known as:  ZOLOFT Take 50 mg by mouth daily.   Vitamin D (Ergocalciferol) 50000 units Caps capsule Commonly known as:  DRISDOL Take 50,000 Units by mouth every 30 (thirty) days.      Contact information for after-discharge care    Destination    [facility] .   Service:  Assisted Living Contact information: [address] [place] [phone]             No Known Allergies  Consultations:  none   Procedures/Studies: Ct Abdomen Pelvis Wo Contrast  Result Date: 04/26/2017 CLINICAL DATA:  Nausea and vomiting EXAM: CT ABDOMEN AND PELVIS WITHOUT CONTRAST TECHNIQUE: Multidetector CT imaging of the abdomen and pelvis was performed following the standard protocol without IV contrast. COMPARISON:  07/03/2013 FINDINGS: Lower chest: Lung bases demonstrate no significant pleural effusion. Coronary vascular calcification. Borderline to mild cardiomegaly. Small hiatal hernia. Patchy dependent atelectasis. Hepatobiliary: No focal hepatic abnormality. Probable small stones in the gallbladder. No biliary dilatation. Pancreas: Unremarkable. No  pancreatic ductal dilatation or surrounding inflammatory changes. Spleen: Normal in size without focal abnormality. Adrenals/Urinary Tract: Adrenal glands are within normal limits. No hydronephrosis. Small stones versus intrarenal vascular calcification. Bladder partially obscured. Stomach/Bowel: Stomach nonenlarged. No dilated small bowel. Numerous left colon diverticula without acute wall thickening or inflammatory change. Negative appendix. Vascular/Lymphatic: Extensive aortic atherosclerotic vascular disease. Aneurysmal dilatation of the infrarenal abdominal aorta measuring up to 3.5 cm. No significantly enlarged lymph nodes. Reproductive: Calcified fibroids. Coarse calcifications in the left adnexal region are unchanged. Other: Negative for free air or free fluid. Musculoskeletal: Status post right hip replacement with associated artifact. 11 mm anterolisthesis L4 on L5. Advanced degenerative changes of the lower lumbar spine. IMPRESSION: 1. Negative for bowel obstruction or bowel wall thickening 2. Extensive left colon diverticular disease without acute inflammation 3. Possible small stones in the gallbladder 4. Infrarenal abdominal aortic aneurysm measuring up to 3.5 cm. Recommend followup by ultrasound in 2 years. This recommendation follows ACR consensus guidelines: White Paper of the ACR Incidental Findings Committee II on Vascular Findings. J Am Coll Radiol 2013; 10:789-794. 5. Multiple calcified uterine fibroids. Stable calcified mass in the left adnexa. Electronically Signed  By: Donavan Foil M.D.   On: 04/26/2017 22:09   Dg Chest 2 View  Result Date: 04/26/2017 CLINICAL DATA:  Cough. EXAM: CHEST - 2 VIEW COMPARISON:  Chest x-ray dated January 14, 2016. CT chest dated March 23, 2008 FINDINGS: The patient is rotated to the right. Stable cardiomediastinal silhouette. Normal pulmonary vascularity. Unchanged right upper lobe lung nodule. New mild patchy opacity in the right lower lobe. No focal  consolidation, pleural effusion, or pneumothorax. No acute osseous abnormality. IMPRESSION: 1. New mild patchy opacity in the right lower lobe may reflect atelectasis versus infiltrate/aspiration. 2. Unchanged right upper lobe nodule. Electronically Signed   By: Titus Dubin M.D.   On: 04/26/2017 17:08   Dg Thoracic Spine 2 View  Result Date: 04/28/2017 CLINICAL DATA:  Upper back pain EXAM: THORACIC SPINE 2 VIEWS COMPARISON:  04/26/2017 FINDINGS: Mild scoliosis is noted concave to the right centered in the mid to lower thoracic spine. This may be in part positional in nature. No compression deformities are noted. Mild degenerative changes of the thoracic spine are noted. No paraspinal mass is seen. Patchy infiltrates are seen similar to that noted on recent chest x-ray. IMPRESSION: Scoliosis without acute bony abnormality. Electronically Signed   By: Inez Catalina M.D.   On: 04/28/2017 16:10   Ct Head Wo Contrast  Result Date: 04/26/2017 CLINICAL DATA:  84 year old hypertensive female with vomiting. History of dementia. Initial encounter. EXAM: CT HEAD WITHOUT CONTRAST TECHNIQUE: Contiguous axial images were obtained from the base of the skull through the vertex without intravenous contrast. COMPARISON:  02/09/2017 CT. FINDINGS: Brain: No intracranial hemorrhage or CT evidence of large acute infarct. Chronic microvascular changes. Moderate global atrophy. No intracranial mass lesion noted on this unenhanced exam. Vascular: Vascular calcifications Skull: No skull fracture Sinuses/Orbits: No acute orbital abnormality. Visualized paranasal sinuses, mastoid air cells and middle ear cavities are clear. Other: Almost complete resolution of right frontal subcutaneous hematoma. IMPRESSION: No acute intracranial abnormality. Global atrophy. Chronic microvascular changes. Electronically Signed   By: Genia Del M.D.   On: 04/26/2017 18:21       Subjective: No chest pain or sob. Breathing well.   Discharge  Exam: Vitals:   04/29/17 0853 04/29/17 0900  BP:  (!) 160/88  Pulse:    Resp:    Temp:    SpO2: (!) 6%    Vitals:   04/29/17 0657 04/29/17 0800 04/29/17 0853 04/29/17 0900  BP: (!) 144/107 (!) 170/91  (!) 160/88  Pulse: 72 72    Resp:  20    Temp:  97.7 F (36.5 C)    TempSrc:  Oral    SpO2:  98% (!) 6%   Weight:      Height:        General: Pt is alert, awake, not in acute distress Cardiovascular: RRR, S1/S2 +, no rubs, no gallops Respiratory: CTA bilaterally, no wheezing, no rhonchi Abdominal: Soft, NT, ND, bowel sounds + Extremities: no edema, no cyanosis    The results of significant diagnostics from this hospitalization (including imaging, microbiology, ancillary and laboratory) are listed below for reference.     Microbiology: Recent Results (from the past 240 hour(s))  Blood culture (routine x 2)     Status: None (Preliminary result)   Collection Time: 04/26/17  8:17 PM  Result Value Ref Range Status   Specimen Description   Final    BLOOD LEFT ANTECUBITAL Performed at [facility] [address] [place], [place] [address]  Special Requests   Final    BOTTLES DRAWN AEROBIC AND ANAEROBIC Blood Culture adequate volume Performed at [facility] [address] [place], [place] [address]    Culture   Final    NO GROWTH 2 DAYS Performed at [facility] [address] [place], [place] [address]    Report Status PENDING  Incomplete  Urine culture     Status: Abnormal   Collection Time: 04/26/17  8:51 PM  Result Value Ref Range Status   Specimen Description   Final    URINE, CLEAN CATCH Performed at [facility], [place] [address], [place], [place] [address]    Special Requests   Final    NONE Performed at [facility], [place] [address], [place], [place] [address]    Culture MULTIPLE SPECIES PRESENT, SUGGEST RECOLLECTION (A)  Final   Report Status 04/28/2017 FINAL  Final   Blood culture (routine x 2)     Status: None (Preliminary result)   Collection Time: 04/26/17  8:51 PM  Result Value Ref Range Status   Specimen Description   Final    BLOOD RIGHT FOREARM Performed at [facility] [address] [place], [place] [address]    Special Requests   Final    BOTTLES DRAWN AEROBIC AND ANAEROBIC Blood Culture adequate volume Performed at [facility] [address] [place], [place] [address]    Culture   Final    NO GROWTH 2 DAYS Performed at [facility] [address], [place], [place] [address]    Report Status PENDING  Incomplete  MRSA PCR Screening     Status: None   Collection Time: 04/26/17 11:30 PM  Result Value Ref Range Status   MRSA by PCR NEGATIVE NEGATIVE Final    Comment:        The GeneXpert MRSA Assay (FDA approved for NASAL specimens only), is one component of a comprehensive MRSA colonization surveillance program. It is not intended to diagnose MRSA infection nor to guide or monitor treatment for MRSA infections. Performed at [facility], [place] [address], [place],  [address]      Labs: BNP (last 3 results) Recent Labs    04/26/17 2052  BNP 762.8*   Basic Metabolic Panel: Recent Labs  Lab 04/26/17 1652 04/27/17 0941 04/28/17 0426 04/29/17 0902  NA 147* 146* 146* 142  K 4.4 4.5 4.2 4.6  CL 108 113* 113* 110  CO2 23 21* 23 22  GLUCOSE 189* 94 88 76  BUN 49* 39* 36* 32*  CREATININE 1.69* 1.68* 1.69* 1.69*  CALCIUM 9.5 8.6* 8.7* 8.9  MG  --   --  1.9  --    Liver Function Tests: Recent Labs  Lab 04/26/17 1652 04/27/17 0941  AST 28 19  ALT 13* 13*  ALKPHOS 96 75  BILITOT 0.6 0.7  PROT 7.9 6.3*  ALBUMIN 3.3* 2.7*   Recent Labs  Lab 04/26/17 1652  LIPASE 44   No results for input(s): AMMONIA in the last 168 hours. CBC: Recent Labs  Lab 04/26/17 1652 04/27/17 0941 04/28/17 0426 04/29/17 0902  WBC 11.5* 7.7 5.5 7.3   NEUTROABS 9.7* 5.8  --  4.9  HGB 9.4* 8.3* 7.0* 12.1  HCT 30.2* 26.8* 22.7* 36.6  MCV 104.1* 104.7* 105.6* 94.3  PLT 243 202 203 225   Cardiac Enzymes: No results for input(s): CKTOTAL, CKMB, CKMBINDEX, TROPONINI in the last 168 hours.  BNP: Invalid input(s): POCBNP CBG: No results for input(s): GLUCAP in the last 168 hours. D-Dimer No results for input(s): DDIMER in the last 72 hours. Hgb A1c No results for input(s): HGBA1C in the last 72 hours. Lipid Profile No results for input(s): CHOL, HDL, LDLCALC, TRIG, CHOLHDL, LDLDIRECT in the last 72 hours. Thyroid function studies No results for input(s): TSH, T4TOTAL, T3FREE, THYROIDAB in the last 72 hours.  Invalid input(s): FREET3 Anemia work up No results for input(s): VITAMINB12, FOLATE, FERRITIN, TIBC, IRON, RETICCTPCT in the last 72 hours. Urinalysis    Component Value Date/Time   COLORURINE YELLOW 04/26/2017 2051   APPEARANCEUR CLEAR 04/26/2017 2051   LABSPEC 1.013 04/26/2017 2051   PHURINE 6.0 04/26/2017 2051   GLUCOSEU NEGATIVE 04/26/2017 2051   HGBUR NEGATIVE 04/26/2017 2051   [place] NEGATIVE 04/26/2017 2051   [place] NEGATIVE 04/26/2017 2051   PROTEINUR 30 (A) 04/26/2017 2051   UROBILINOGEN 1.0 07/03/2013 0117   NITRITE NEGATIVE 04/26/2017 2051   LEUKOCYTESUR NEGATIVE 04/26/2017 2051   Sepsis Labs Invalid input(s): PROCALCITONIN,  WBC,  LACTICIDVEN Microbiology Recent Results (from the past 240 hour(s))  Blood culture (routine x 2)     Status: None (Preliminary result)   Collection Time: 04/26/17  8:17 PM  Result Value Ref Range Status   Specimen Description   Final    BLOOD LEFT ANTECUBITAL Performed at [facility], [place] [address] [place], [place] [address]    Special Requests   Final    BOTTLES DRAWN AEROBIC AND ANAEROBIC Blood Culture adequate volume Performed at [facility] [address], [place], [place] [address]    Culture   Final    NO GROWTH 2  DAYS Performed at [facility] [address], [place], [place] [address]    Report Status PENDING  Incomplete  Urine culture     Status: Abnormal   Collection Time: 04/26/17  8:51 PM  Result Value Ref Range Status   Specimen Description   Final    URINE, CLEAN CATCH Performed at [facility], [place] [address], [place], [place] [address]    Special Requests   Final    NONE Performed at [facility], [place] [address], [place], [place] [address]    Culture MULTIPLE SPECIES PRESENT, SUGGEST RECOLLECTION (A)  Final   Report Status 04/28/2017 FINAL  Final  Blood culture (routine x 2)     Status: None (Preliminary result)   Collection Time: 04/26/17  8:51 PM  Result Value Ref Range Status   Specimen Description   Final    BLOOD RIGHT FOREARM Performed at [facility] [address], [place], [place] [address]    Special Requests   Final    BOTTLES DRAWN AEROBIC AND ANAEROBIC Blood Culture adequate volume Performed at [facility] [address] [place], [place] [address]    Culture   Final    NO GROWTH 2 DAYS Performed at [facility] [address] [place], [place] [address]    Report Status PENDING  Incomplete  MRSA PCR Screening     Status: None   Collection Time: 04/26/17 11:30 PM  Result Value Ref Range Status   MRSA by PCR NEGATIVE NEGATIVE Final    Comment:        The GeneXpert MRSA Assay (FDA approved for NASAL specimens only), is one component of a comprehensive MRSA colonization surveillance program. It is not intended to diagnose MRSA  infection nor to guide or monitor treatment for MRSA infections. Performed at [facility], [place] [address] [place], [place] [address]      Time coordinating discharge: 35 minutes  SIGNED:   Hosie Poisson, MD  [facility] 04/29/2017, 1:19 PM Pager   If 7PM-7AM, please contact  night-coverage [email or web address] Password [email or web address]

## 2017-04-29 NOTE — Progress Notes (Signed)
OT Cancellation Note  Patient Details Name: Daren Doswell MRN: [id number] DOB: 03-08-1922   Cancelled Treatment:    Reason Eval/Treat Not Completed: Other (comment).  Spoke to SW.  Pt was total care except for self feeding at times, at ALF prior to admission but got up to w/c with assistance. Plan is for her to return there.  Will sign off.  Jair Lindblad 04/29/2017, 10:16 AM  Lesle Chris, OTR/L [phone] 04/29/2017

## 2017-04-29 NOTE — Progress Notes (Signed)
Pt plan to return to her [facility], report # [phone]. DC information provided via the [facility] at ALF confirmed receipt.  Will arrange [facility] transportation for once pt is weaned off 1L o2.  Left voicemail for pt's granddaughter to update her on DC.  Sharren Bridge, MSW, LCSW Clinical Social Work 04/29/2017 [phone]

## 2017-04-29 NOTE — Progress Notes (Signed)
PT Cancellation Note  Patient Details Name: Mikeila Burgen MRN: [id number] DOB: [birth year]   Cancelled Treatment:    Reason Eval/Treat Not Completed: Spoke with SW about pt's PLOF-pt was dependent for ADLs and mobility prior to admission. Per SW, PT eval likely not needed for pt to return to ALF. Plan is for pt to return to ALF at discharge. Will sign off. Thanks.    Weston Anna, MPT Pager: [phone]

## 2017-04-29 NOTE — Progress Notes (Signed)
Patient has no orders for H&H post transfusion. Tylene Fantasia paged. Also passed on to [facility] for today.

## 2017-04-29 NOTE — Care Management Important Message (Signed)
Important Message  Patient Details  Name: Annette Cantrell MRN: [id number] Date of Birth: 12-01-1922   Medicare Important Message Given:  Yes    Kerin Salen 04/29/2017, 11:00 AMImportant Message  Patient Details  Name: Annette Cantrell MRN: [id number] Date of Birth: [birth year]   Medicare Important Message Given:  Yes    Kerin Salen 04/29/2017, 11:00 AM

## 2017-04-29 NOTE — Progress Notes (Signed)
Pt discharged to SNF. Left unit on stretcher transported by PTAR. Left in stable condition.  Hale Bogus.

## 2017-04-29 NOTE — Progress Notes (Signed)
PT Cancellation Note  Patient Details Name: Annette Cantrell MRN: [id number] DOB: 12-05-1922   Cancelled Treatment:    Reason Eval/Treat Not Completed: 2nd attempt to evaluate pt. Called to front desk and requested NT assistance (on both attempts) to clean pt up for working with PT. Will check back once pt has received assistanc. Thanks.    Weston Anna, MPT Pager: [phone]

## 2017-04-29 NOTE — Progress Notes (Signed)
  Speech Language Pathology Treatment: Dysphagia  Patient Details Name: Annette Cantrell MRN: [id number] DOB: 1922/07/22 Today's Date: 04/29/2017 Time: 0158-6825 SLP Time Calculation (min) (ACUTE ONLY): 14 min  Assessment / Plan / Recommendation Clinical Impression  Today pt fully alert and willingly accepting of intake.  Observed her self feeding gingerale via straw - She independently takes small single boluses and brings head forward to straw - . No  S/s of aspiration with intake today.  Suspect possible aspiration pna was due to pt's nausea/vomiting episode.  Recommend continue diet as tolerated, no SLP follow up needed at this time. No family present to educate to recommendations, SLP updated swallow precaution sign.    HPI HPI: 84 yo female adm to [facility] with sepsis, nausea/vomiting - diagnosed with right lower lobe pna.  PMH + for COPD, smoker, stroke.  CXR showed right lower lobe infiltrate vs ATX.  CT head negative.  Swallow eval ordered - pt is a hospice pt.       SLP Plan  All goals met       Recommendations  Diet recommendations: Regular;Thin liquid Liquids provided via: Cup;Straw Medication Administration: (as tolerated, with puree if problematic) Supervision: Patient able to self feed Compensations: Slow rate;Small sips/bites Postural Changes and/or Swallow Maneuvers: Seated upright 90 degrees;Upright 30-60 min after meal                Oral Care Recommendations: Oral care BID SLP Visit Diagnosis: Dysphagia, oral phase (R13.11) Plan: All goals met       GO                [name] 04/29/2017, 4:11 PM  Annette Cantrell, [facility] SLP [phone]

## 2017-04-29 NOTE — [place] FL2 (Addendum)
[place] MEDICAID FL2 LEVEL OF CARE SCREENING TOOL     IDENTIFICATION  Patient Name: Annette Cantrell Birthdate: May 22, 1922 Sex: female Admission Date (Current Location): 04/26/2017  [facility] and [place] Number:  Herbalist and Address:  [facility],  [place] [address], [place]      Provider Number: [id number]  Attending Physician Name and Address:  Hosie Poisson, MD  Relative Name and Phone Number:       Current Level of Care: Hospital Recommended Level of Care: Assisted Living Facility(memory care) Prior Approval Number:    Date Approved/Denied:   PASRR Number:    Discharge Plan: Other (Comment)(assisted living memory care locked unit)    Current Diagnoses: Patient Active Problem List   Diagnosis Date Noted  . Acute metabolic encephalopathy 67/61/9509  . Aspiration pneumonia ([place]) 04/26/2017  . Sepsis ([place]) 04/26/2017  . Nausea & vomiting 04/26/2017  . Acute renal failure superimposed on stage 3 chronic kidney disease ([place]) 04/26/2017  . Chronic systolic CHF (congestive heart failure) ([place]) 04/26/2017  . Palliative care encounter 04/04/2013  . Dysphagia, unspecified(787.20) 04/04/2013  . Weakness generalized 04/04/2013  . Malnutrition of moderate degree ([place]) 04/03/2013  . Thrombocytopenia, unspecified ([place]) 04/01/2013  . Protein-calorie malnutrition, severe ([place]) 04/01/2013  . COPD (chronic obstructive pulmonary disease) ([place]) 03/31/2013  . Lactic acidosis 03/31/2013  . Chest pain 03/31/2013  . Acute respiratory failure with hypoxia ([place]) 11/17/2012  . COPD exacerbation ([place]) 11/17/2012  . Acute renal failure ([place]) 11/17/2012  . HTN (hypertension) 11/17/2012  . Anemia 11/17/2012  . Depression 11/17/2012  . Dementia   . Tobacco abuse 07/03/2009  . HYPERLIPIDEMIA 04/20/2008    Orientation RESPIRATION BLADDER Height & Weight     Self, Place  Normal Incontinent Weight: 104 lb 15 oz (47.6 kg) Height:  5' (152.4 cm)   BEHAVIORAL SYMPTOMS/MOOD NEUROLOGICAL BOWEL NUTRITION STATUS      Incontinent(intermittently) Diet(no salt added) chopped meat diet  AMBULATORY STATUS COMMUNICATION OF NEEDS Skin   Extensive Assist Verbally Normal                       Personal Care Assistance Level of Assistance  Bathing, Feeding, Dressing Bathing Assistance: Limited assistance Feeding assistance: Independent Dressing Assistance: Limited assistance     Functional Limitations Info  Sight, Hearing, Speech Sight Info: Adequate Hearing Info: Adequate Speech Info: Impaired(garbled at times)    SPECIAL CARE FACTORS FREQUENCY                       Contractures Contractures Info: Not present    Additional Factors Info  Code Status, Allergies Code Status Info: DNR Allergies Info: nka           Current Medications (04/29/2017):  This is the current hospital active medication list Current Facility-Administered Medications  Medication Dose Route Frequency Provider Last Rate Last Dose  . acetaminophen (TYLENOL) tablet 650 mg  650 mg Oral Q6H PRN Ivor Costa, MD   650 mg at 04/27/17 2112  . albuterol (PROVENTIL) (2.5 MG/3ML) 0.083% nebulizer solution 3 mL  3 mL Inhalation Q4H PRN Ivor Costa, MD   3 mL at 04/26/17 2110  . alum & mag hydroxide-simeth (MAALOX/MYLANTA) 200-200-20 MG/5ML suspension 30 mL  30 mL Oral Q6H PRN Ivor Costa, MD      . amLODipine (NORVASC) tablet 5 mg  5 mg Oral Daily Ivor Costa, MD   5 mg at 04/29/17 0919  . amoxicillin-clavulanate (AUGMENTIN) 250-62.5 MG/5ML  suspension 500 mg  500 mg Oral Q12H Hosie Poisson, MD      . divalproex (DEPAKOTE SPRINKLE) capsule 125 mg  125 mg Oral QID Ivor Costa, MD   125 mg at 04/29/17 1249  . feeding supplement (ENSURE ENLIVE) (ENSURE ENLIVE) liquid 237 mL  237 mL Oral BID BM Patrecia Pour, Christean Grief, MD   237 mL at 04/29/17 0930  . guaiFENesin (ROBITUSSIN) 100 MG/5ML solution 200 mg  200 mg Oral QID PRN Ivor Costa, MD      . heparin injection 5,000 Units   5,000 Units Subcutaneous Q12H Ivor Costa, MD   5,000 Units at 04/29/17 0920  . hydrALAZINE (APRESOLINE) injection 5 mg  5 mg Intravenous Q2H PRN Ivor Costa, MD   5 mg at 04/29/17 0537  . hydrocortisone (ANUSOL-HC) 2.5 % rectal cream   Topical Q6H PRN Ivor Costa, MD      . hydrOXYzine (ATARAX/VISTARIL) tablet 10 mg  10 mg Oral TID PRN Ivor Costa, MD      . hydrOXYzine (VISTARIL) injection 25 mg  25 mg Intramuscular Q6H PRN Ivor Costa, MD      . ipratropium-albuterol (DUONEB) 0.5-2.5 (3) MG/3ML nebulizer solution 3 mL  3 mL Nebulization BID Hosie Poisson, MD   3 mL at 04/29/17 0852  . LORazepam (ATIVAN) tablet 0.5 mg  0.5 mg Oral Q4H PRN Ivor Costa, MD   0.5 mg at 04/28/17 1942  . magnesium hydroxide (MILK OF MAGNESIA) suspension 30 mL  30 mL Oral Daily PRN Ivor Costa, MD      . mirtazapine (REMERON) tablet 7.5 mg  7.5 mg Oral QHS Ivor Costa, MD   7.5 mg at 04/28/17 2136  . nicotine (NICODERM CQ - dosed in mg/24 hours) patch 21 mg  21 mg Transdermal Daily Ivor Costa, MD   21 mg at 04/29/17 6160  . OLANZapine zydis (ZYPREXA) disintegrating tablet 5 mg  5 mg Oral QHS Ivor Costa, MD   5 mg at 04/28/17 2136  . sertraline (ZOLOFT) tablet 50 mg  50 mg Oral Daily Ivor Costa, MD   50 mg at 04/29/17 0919  . zolpidem (AMBIEN) tablet 5 mg  5 mg Oral QHS PRN Ivor Costa, MD         Discharge Medications: acetaminophen 325 MG tablet Commonly known as:  TYLENOL Take 650 mg by mouth every morning.   albuterol (2.5 MG/3ML) 0.083% nebulizer solution Commonly known as:  PROVENTIL Inhale 3 mLs into the lungs every 4 (four) hours as needed for wheezing or shortness of breath.   amLODipine 5 MG tablet Commonly known as:  NORVASC Take 1 tablet (5 mg total) by mouth daily. Start taking on:  04/30/2017   amoxicillin-clavulanate 250-62.5 MG/5ML suspension Commonly known as:  AUGMENTIN Take 10 mLs (500 mg total) by mouth every 12 (twelve) hours for 5 days. Start taking on:  04/30/2017   divalproex 125 MG  capsule Commonly known as:  DEPAKOTE SPRINKLE Take 125 mg by mouth 4 (four) times daily.   guaifenesin 100 MG/5ML syrup Commonly known as:  ROBITUSSIN Take 200 mg by mouth 4 (four) times daily as needed for cough.   hydrocortisone 2.5 % ointment Apply 1 application topically every 6 (six) hours as needed (pain).   ipratropium-albuterol 0.5-2.5 (3) MG/3ML Soln Commonly known as:  DUONEB Take 3 mLs by nebulization every 6 (six) hours as needed (sob and cough).   LORazepam 0.5 MG tablet Commonly known as:  ATIVAN Take 0.5 mg by mouth every 4 (four) hours as  needed for anxiety.   magnesium hydroxide 400 MG/5ML suspension Commonly known as:  MILK OF MAGNESIA Take 30 mLs by mouth daily as needed for mild constipation.   [place] 200-200-20 MG/5ML suspension Generic drug:  alum & mag hydroxide-simeth Take 30 mLs by mouth every 6 (six) hours as needed for indigestion or heartburn.   mirtazapine 7.5 MG tablet Commonly known as:  REMERON Take 7.5 mg by mouth at bedtime.   NUTRITIONAL SUPPLEMENT PO Take 1 Container by mouth 3 (three) times daily with meals. Great Shakes   OLANZapine zydis 5 MG disintegrating tablet Commonly known as:  ZYPREXA Take 1 tablet (5 mg total) by mouth at bedtime.   sertraline 50 MG tablet Commonly known as:  ZOLOFT Take 50 mg by mouth daily.   Vitamin D (Ergocalciferol) 50000 units Caps capsule Commonly known as:  DRISDOL Take 50,000 Units by mouth every 30 (thirty) days.      Relevant Imaging Results:  Relevant Lab Results:   Additional Information [id number]  Nila Nephew, LCSW

## 2017-04-30 LAB — TYPE AND SCREEN
ABO/RH(D): O POS
ANTIBODY SCREEN: NEGATIVE
UNIT DIVISION: 0
UNIT DIVISION: 0

## 2017-04-30 LAB — BPAM RBC
Blood Product Expiration Date: 201905182359
Blood Product Expiration Date: 201905182359
ISSUE DATE / TIME: 201904242155
ISSUE DATE / TIME: 201904250150
Unit Type and Rh: 5100
Unit Type and Rh: 5100

## 2017-05-01 LAB — CULTURE, BLOOD (ROUTINE X 2)
CULTURE: NO GROWTH
Culture: NO GROWTH
SPECIAL REQUESTS: ADEQUATE
SPECIAL REQUESTS: ADEQUATE

## 2017-11-30 ENCOUNTER — Encounter (HOSPITAL_COMMUNITY): Payer: Self-pay | Admitting: Emergency Medicine

## 2017-11-30 ENCOUNTER — Other Ambulatory Visit: Payer: Self-pay

## 2017-11-30 ENCOUNTER — Emergency Department (HOSPITAL_COMMUNITY): Payer: Medicare Other

## 2017-11-30 ENCOUNTER — Inpatient Hospital Stay (HOSPITAL_COMMUNITY)
Admission: EM | Admit: 2017-11-30 | Discharge: 2017-12-05 | DRG: 871 | Disposition: A | Discharge status: Hospice | Payer: Medicare Other | Source: Skilled Nursing Facility | Attending: Internal Medicine | Admitting: Internal Medicine

## 2017-11-30 DIAGNOSIS — Z823 Family history of stroke: Secondary | ICD-10-CM

## 2017-11-30 DIAGNOSIS — F0390 Unspecified dementia without behavioral disturbance: Secondary | ICD-10-CM | POA: Diagnosis present

## 2017-11-30 DIAGNOSIS — D631 Anemia in chronic kidney disease: Secondary | ICD-10-CM | POA: Diagnosis present

## 2017-11-30 DIAGNOSIS — N184 Chronic kidney disease, stage 4 (severe): Secondary | ICD-10-CM

## 2017-11-30 DIAGNOSIS — F17210 Nicotine dependence, cigarettes, uncomplicated: Secondary | ICD-10-CM | POA: Diagnosis present

## 2017-11-30 DIAGNOSIS — J690 Pneumonitis due to inhalation of food and vomit: Secondary | ICD-10-CM

## 2017-11-30 DIAGNOSIS — J189 Pneumonia, unspecified organism: Secondary | ICD-10-CM | POA: Diagnosis present

## 2017-11-30 DIAGNOSIS — G9341 Metabolic encephalopathy: Secondary | ICD-10-CM | POA: Diagnosis present

## 2017-11-30 DIAGNOSIS — I4510 Unspecified right bundle-branch block: Secondary | ICD-10-CM | POA: Diagnosis present

## 2017-11-30 DIAGNOSIS — E870 Hyperosmolality and hypernatremia: Secondary | ICD-10-CM

## 2017-11-30 DIAGNOSIS — L8990 Pressure ulcer of unspecified site, unspecified stage: Secondary | ICD-10-CM

## 2017-11-30 DIAGNOSIS — E7800 Pure hypercholesterolemia, unspecified: Secondary | ICD-10-CM | POA: Diagnosis present

## 2017-11-30 DIAGNOSIS — E785 Hyperlipidemia, unspecified: Secondary | ICD-10-CM | POA: Diagnosis present

## 2017-11-30 DIAGNOSIS — Y95 Nosocomial condition: Secondary | ICD-10-CM | POA: Diagnosis present

## 2017-11-30 DIAGNOSIS — F0150 Vascular dementia without behavioral disturbance: Secondary | ICD-10-CM | POA: Diagnosis present

## 2017-11-30 DIAGNOSIS — N179 Acute kidney failure, unspecified: Secondary | ICD-10-CM

## 2017-11-30 DIAGNOSIS — G40909 Epilepsy, unspecified, not intractable, without status epilepticus: Secondary | ICD-10-CM

## 2017-11-30 DIAGNOSIS — J441 Chronic obstructive pulmonary disease with (acute) exacerbation: Secondary | ICD-10-CM | POA: Diagnosis present

## 2017-11-30 DIAGNOSIS — R7611 Nonspecific reaction to tuberculin skin test without active tuberculosis: Secondary | ICD-10-CM | POA: Diagnosis present

## 2017-11-30 DIAGNOSIS — R5381 Other malaise: Secondary | ICD-10-CM

## 2017-11-30 DIAGNOSIS — Z96641 Presence of right artificial hip joint: Secondary | ICD-10-CM | POA: Diagnosis present

## 2017-11-30 DIAGNOSIS — J9601 Acute respiratory failure with hypoxia: Secondary | ICD-10-CM | POA: Diagnosis present

## 2017-11-30 DIAGNOSIS — C3411 Malignant neoplasm of upper lobe, right bronchus or lung: Secondary | ICD-10-CM | POA: Diagnosis present

## 2017-11-30 DIAGNOSIS — I2510 Atherosclerotic heart disease of native coronary artery without angina pectoris: Secondary | ICD-10-CM | POA: Diagnosis present

## 2017-11-30 DIAGNOSIS — Z8673 Personal history of transient ischemic attack (TIA), and cerebral infarction without residual deficits: Secondary | ICD-10-CM | POA: Diagnosis not present

## 2017-11-30 DIAGNOSIS — Z66 Do not resuscitate: Secondary | ICD-10-CM | POA: Diagnosis present

## 2017-11-30 DIAGNOSIS — E860 Dehydration: Secondary | ICD-10-CM | POA: Diagnosis present

## 2017-11-30 DIAGNOSIS — I5042 Chronic combined systolic (congestive) and diastolic (congestive) heart failure: Secondary | ICD-10-CM | POA: Diagnosis present

## 2017-11-30 DIAGNOSIS — I10 Essential (primary) hypertension: Secondary | ICD-10-CM | POA: Diagnosis present

## 2017-11-30 DIAGNOSIS — I130 Hypertensive heart and chronic kidney disease with heart failure and stage 1 through stage 4 chronic kidney disease, or unspecified chronic kidney disease: Secondary | ICD-10-CM | POA: Diagnosis present

## 2017-11-30 DIAGNOSIS — Z7189 Other specified counseling: Secondary | ICD-10-CM | POA: Diagnosis not present

## 2017-11-30 DIAGNOSIS — Z515 Encounter for palliative care: Secondary | ICD-10-CM | POA: Diagnosis not present

## 2017-11-30 DIAGNOSIS — A419 Sepsis, unspecified organism: Principal | ICD-10-CM | POA: Diagnosis present

## 2017-11-30 DIAGNOSIS — L89891 Pressure ulcer of other site, stage 1: Secondary | ICD-10-CM | POA: Diagnosis present

## 2017-11-30 DIAGNOSIS — R7989 Other specified abnormal findings of blood chemistry: Secondary | ICD-10-CM

## 2017-11-30 DIAGNOSIS — D649 Anemia, unspecified: Secondary | ICD-10-CM

## 2017-11-30 DIAGNOSIS — J181 Lobar pneumonia, unspecified organism: Secondary | ICD-10-CM | POA: Diagnosis not present

## 2017-11-30 LAB — CBC
HCT: 27.8 % — ABNORMAL LOW (ref 36.0–46.0)
Hemoglobin: 8.2 g/dL — ABNORMAL LOW (ref 12.0–15.0)
MCH: 32.7 pg (ref 26.0–34.0)
MCHC: 29.5 g/dL — ABNORMAL LOW (ref 30.0–36.0)
MCV: 110.8 fL — AB (ref 80.0–100.0)
NRBC: 0 % (ref 0.0–0.2)
Platelets: 173 10*3/uL (ref 150–400)
RBC: 2.51 MIL/uL — AB (ref 3.87–5.11)
RDW: 12.2 % (ref 11.5–15.5)
WBC: 15.2 10*3/uL — AB (ref 4.0–10.5)

## 2017-11-30 LAB — BASIC METABOLIC PANEL
Anion gap: 12 (ref 5–15)
BUN: 73 mg/dL — ABNORMAL HIGH (ref 8–23)
CHLORIDE: 115 mmol/L — AB (ref 98–111)
CO2: 21 mmol/L — ABNORMAL LOW (ref 22–32)
Calcium: 8.7 mg/dL — ABNORMAL LOW (ref 8.9–10.3)
Creatinine, Ser: 2.43 mg/dL — ABNORMAL HIGH (ref 0.44–1.00)
GFR calc Af Amer: 19 mL/min — ABNORMAL LOW (ref >60)
GFR calc non Af Amer: 17 mL/min — ABNORMAL LOW (ref >60)
Glucose, Bld: 130 mg/dL — ABNORMAL HIGH (ref 70–99)
POTASSIUM: 4.1 mmol/L (ref 3.5–5.1)
SODIUM: 148 mmol/L — AB (ref 135–145)

## 2017-11-30 LAB — BRAIN NATRIURETIC PEPTIDE: B NATRIURETIC PEPTIDE 5: 241.4 pg/mL — AB (ref 0.0–100.0)

## 2017-11-30 LAB — TROPONIN I

## 2017-11-30 MED ORDER — VANCOMYCIN HCL IN DEXTROSE 1-5 GM/200ML-% IV SOLN
1000.0000 mg | Freq: Once | INTRAVENOUS | Status: AC
  Administered 2017-11-30: 1000 mg via INTRAVENOUS
  Filled 2017-11-30: qty 200

## 2017-11-30 MED ORDER — SODIUM CHLORIDE 0.9 % IV SOLN
1000.0000 mL | INTRAVENOUS | Status: DC
  Administered 2017-11-30 – 2017-12-02 (×5): 1000 mL via INTRAVENOUS

## 2017-11-30 MED ORDER — SODIUM CHLORIDE 0.9 % IV BOLUS (SEPSIS)
500.0000 mL | Freq: Once | INTRAVENOUS | Status: AC
  Administered 2017-11-30: 500 mL via INTRAVENOUS

## 2017-11-30 MED ORDER — CEFEPIME HCL 1 G IJ SOLR
1.0000 g | Freq: Once | INTRAMUSCULAR | Status: AC
  Administered 2017-11-30: 1 g via INTRAVENOUS
  Filled 2017-11-30: qty 1

## 2017-11-30 NOTE — ED Provider Notes (Signed)
[facility] DEPT Provider Note   CSN: [id number] Arrival date & time: 11/30/17  1751     History   Chief Complaint Chief Complaint  Patient presents with  . Shortness of Breath    HPI Annette Cantrell is a 84 y.o. female.  HPI Patient presented to the emergency room for evaluation of shortness of breath.  Patient has a history of COPD.  She is a resident of a nursing facility.  According to the EMS report the patient has had some sitting breathing over the last couple of days.  They tried giving her breathing treatments but she continued to remain short of breath.  They also noted that her oxygen levels were decreasing.  The called EMS to bring her to the emergency room for evaluation.  Patient's initial saturation was 90%.  Patient has severe dementia.  At baseline she does not communicate. Past Medical History:  Diagnosis Date  . CAD (coronary artery disease)   . Chronic systolic heart failure ([place])   . COPD 03/30/2008   Qualifier: Diagnosis of  By: [name] MD, [name]   . Dementia ([place])   . Heart disease, unspecified   . Herpes zoster 01/30/2012  . High cholesterol   . History of positive PPD, untreated   . Infected sebaceous cyst 01/25/2012  . LUNG NODULE 03/30/2008   Qualifier: Diagnosis of  By: [name] MD, [name]   . Stroke [facility])     Patient Active Problem List   Diagnosis Date Noted  . Acute metabolic encephalopathy 71/69/6789  . Aspiration pneumonia ([place]) 04/26/2017  . Sepsis ([place]) 04/26/2017  . Nausea & vomiting 04/26/2017  . Acute renal failure superimposed on stage 3 chronic kidney disease ([place]) 04/26/2017  . Chronic systolic CHF (congestive heart failure) ([place]) 04/26/2017  . Palliative care encounter 04/04/2013  . Dysphagia, unspecified(787.20) 04/04/2013  . Weakness generalized 04/04/2013  . Malnutrition of moderate degree ([place]) 04/03/2013  . Thrombocytopenia, unspecified ([place]) 04/01/2013  . Protein-calorie malnutrition,  severe ([place]) 04/01/2013  . COPD (chronic obstructive pulmonary disease) ([place]) 03/31/2013  . Lactic acidosis 03/31/2013  . Chest pain 03/31/2013  . Acute respiratory failure with hypoxia ([place]) 11/17/2012  . COPD exacerbation ([place]) 11/17/2012  . Acute renal failure ([place]) 11/17/2012  . HTN (hypertension) 11/17/2012  . Anemia 11/17/2012  . Depression 11/17/2012  . Dementia ([place])   . Tobacco abuse 07/03/2009  . HYPERLIPIDEMIA 04/20/2008    Past Surgical History:  Procedure Laterality Date  . HIP ARTHROPLASTY  01/31/2012   Procedure: ARTHROPLASTY BIPOLAR HIP;  Surgeon: Rozanna Box, MD;  Location: [facility];  Service: Orthopedics;  Laterality: Right;     OB History   None      Home Medications    Prior to Admission medications   Medication Sig Start Date End Date Taking? Authorizing Provider  acetaminophen (TYLENOL) 325 MG tablet Take 650 mg by mouth every morning.   Yes [provider]  acetaminophen (TYLENOL) 500 MG tablet Take 500 mg by mouth every 4 (four) hours as needed for moderate pain.   Yes [provider]  albuterol (PROVENTIL) (2.5 MG/3ML) 0.083% nebulizer solution Inhale 3 mLs into the lungs every 4 (four) hours as needed for wheezing or shortness of breath. 01/14/16  Yes Mesner, Corene Cornea, MD  alum & mag hydroxide-simeth ([place]) 200-200-20 MG/5ML suspension Take 30 mLs by mouth every 6 (six) hours as needed for indigestion or heartburn.   Yes [provider]  amLODipine (NORVASC) 5 MG tablet Take 1  tablet (5 mg total) by mouth daily. 04/30/17  Yes Hosie Poisson, MD  azithromycin (ZITHROMAX) 250 MG tablet Take 250 mg by mouth daily. Take for 5 Days   Yes [provider]  divalproex (DEPAKOTE SPRINKLE) 125 MG capsule Take 125 mg by mouth 4 (four) times daily.    Yes [provider]  ferrous sulfate 325 (65 FE) MG tablet Take 325 mg by mouth daily with breakfast.   Yes [provider]  guaifenesin (ROBITUSSIN) 100 MG/5ML syrup  Take 200 mg by mouth 4 (four) times daily as needed for cough.   Yes [provider]  hydrocortisone 2.5 % ointment Apply 1 application topically every 6 (six) hours as needed (pain).   Yes [provider]  ipratropium-albuterol (DUONEB) 0.5-2.5 (3) MG/3ML SOLN Take 3 mLs by nebulization every 6 (six) hours as needed (sob and cough).   Yes [provider]  lisinopril-hydrochlorothiazide (PRINZIDE,ZESTORETIC) 20-12.5 MG tablet Take 1 tablet by mouth daily.   Yes [provider]  loperamide (IMODIUM) 2 MG capsule Take 2 mg by mouth daily as needed for diarrhea or loose stools.   Yes [provider]  LORazepam (ATIVAN) 0.5 MG tablet Take 0.5 mg by mouth every 4 (four) hours as needed for anxiety.   Yes [provider]  magnesium hydroxide (MILK OF MAGNESIA) 400 MG/5ML suspension Take 30 mLs by mouth daily as needed for mild constipation.   Yes [provider]  magnesium hydroxide (MILK OF MAGNESIA) 400 MG/5ML suspension Take 30 mLs by mouth at bedtime as needed for mild constipation or moderate constipation.   Yes [provider]  mirtazapine (REMERON) 7.5 MG tablet Take 7.5 mg by mouth at bedtime.   Yes [provider]  neomycin-bacitracin-polymyxin (NEOSPORIN) ointment Apply 1 application topically daily as needed for wound care.   Yes [provider]  Nutritional Supplements (NUTRITIONAL SUPPLEMENT PO) Take 237 mLs by mouth 3 (three) times daily with meals. Great Shakes    Yes [provider]  OLANZapine zydis (ZYPREXA) 5 MG disintegrating tablet Take 1 tablet (5 mg total) by mouth at bedtime. 04/05/13  Yes Velvet Bathe, MD  risperiDONE (RISPERDAL) 0.5 MG tablet Take 0.5 mg by mouth daily.   Yes [provider]  sertraline (ZOLOFT) 50 MG tablet Take 50 mg by mouth daily.   Yes [provider]    Family History Family History  Problem Relation Age of Onset  . Stroke Mother      Social History Social History   Tobacco Use  . Smoking status: Current Every Day Smoker    Packs/day: 0.20    Types: Cigarettes  . Smokeless tobacco: Never Used  Substance Use Topics  . Alcohol use: No  . Drug use: No     Allergies   Patient has no known allergies.   Review of Systems Review of Systems  Unable to perform ROS: Mental status change     Physical Exam Updated Vital Signs BP (!) 103/50   Pulse 100   Temp 97.6 F (36.4 C)   Resp (!) 24   SpO2 96%   Physical Exam  Constitutional: She appears ill. No distress.  Elderly, frail  HENT:  Head: Normocephalic and atraumatic.  Right Ear: External ear normal.  Left Ear: External ear normal.  Eyes: Conjunctivae are normal. Right eye exhibits no discharge. Left eye exhibits no discharge. No scleral icterus.  Neck: Neck supple. No tracheal deviation present.  Cardiovascular: Regular rhythm and intact distal pulses. Tachycardia  present.  Pulmonary/Chest: No stridor. Tachypnea noted. She has wheezes. She has no rales.  Abdominal: Soft. Bowel sounds are normal. She exhibits no distension. There is no tenderness. There is no rebound and no guarding.  Musculoskeletal: She exhibits no edema or tenderness.  Neurological: No cranial nerve deficit (no facial droop, patient does not follow commands or speak) or sensory deficit. She exhibits normal muscle tone. She displays no seizure activity.  Patient does not follow any commands when I speak to her, she does not open her eyes or answer any questions, minimally responsive  Skin: Skin is warm and dry. No rash noted.  Psychiatric: She has a normal mood and affect.  Nursing note and vitals reviewed.    ED Treatments / Results  Labs (all labs ordered are listed, but only abnormal results are displayed) Labs Reviewed  CBC - Abnormal; Notable for the following components:      Result Value   WBC 15.2 (*)    RBC 2.51 (*)    Hemoglobin 8.2 (*)    HCT 27.8 (*)    MCV  110.8 (*)    MCHC 29.5 (*)    All other components within normal limits  BASIC METABOLIC PANEL - Abnormal; Notable for the following components:   Sodium 148 (*)    Chloride 115 (*)    CO2 21 (*)    Glucose, Bld 130 (*)    BUN 73 (*)    Creatinine, Ser 2.43 (*)    Calcium 8.7 (*)    GFR calc non Af Amer 17 (*)    GFR calc Af Amer 19 (*)    All other components within normal limits  BRAIN NATRIURETIC PEPTIDE - Abnormal; Notable for the following components:   B Natriuretic Peptide 241.4 (*)    All other components within normal limits  CULTURE, BLOOD (ROUTINE X 2)  CULTURE, BLOOD (ROUTINE X 2)  TROPONIN I    EKG EKG Interpretation  Date/Time:  Tuesday November 30 2017 18:09:38 EST Ventricular Rate:  104 PR Interval:    QRS Duration: 130 QT Interval:  377 QTC Calculation: 496 R Axis:   -24 Text Interpretation:  Sinus tachycardia Right bundle branch block LVH with secondary repolarization abnormality Inferior infarct, old Anterolateral infarct, age indeterminate Since last tracing rate faster Confirmed by Dorie Rank [phone]) on 11/30/2017 6:43:11 PM   Radiology Dg Chest 2 View  Result Date: 11/30/2017 CLINICAL DATA:  Short of breath EXAM: CHEST - 2 VIEW COMPARISON:  04/26/2017 FINDINGS: Bibasilar airspace disease could represent pneumonia or atelectasis Negative for heart failure. Thoracic aortic ectasia and atherosclerotic disease. No significant effusion. IMPRESSION: Bibasilar atelectasis/pneumonia. Electronically Signed   By: Franchot Gallo M.D.   On: 11/30/2017 19:41    Procedures Procedures (including critical care time)  Medications Ordered in ED Medications  sodium chloride 0.9 % bolus 500 mL (has no administration in time range)    Followed by  0.9 %  sodium chloride infusion (has no administration in time range)  ceFEPIme (MAXIPIME) 1 g in sodium chloride 0.9 % 100 mL IVPB (has no administration in time range)     Initial Impression / Assessment and Plan /  ED Course  I have reviewed the triage vital signs and the nursing notes.  Pertinent labs & imaging results that were available during my care of the patient were reviewed by me and considered in my medical decision making (see chart for details).  Clinical Course as of Nov 30 2098  Tue Nov 30, 2017  2046 Labs notable for AKI.  WBC also elevated   [JK]  2047 Anemia appears chronic   [JK]  2047 CXR suggests pna.  Will start abx.  Will consult with hospice as notes indicate pt is a hospice patient   [JK]    Clinical Course User Index [JK] Dorie Rank, MD    Patient presented to the emergency room for shortness of breath.  Patient is DNR but no longer a hospice patient according to the notes.  X-rays suggest possible pneumonia.  She does have an elevated white blood cell count as well as an acute kidney injury.  IV antibiotics have ordered.  Patient has been given IV fluids.  I will consult the medical service for admission and further treatment.  Currently no family has been at the bedside.  I did call hospice because initially notes indicated that she was a hospice patient but this is no longer true.  Final Clinical Impressions(s) / ED Diagnoses   Final diagnoses:  Pneumonia of both lower lobes due to infectious organism [facility])  AKI (acute kidney injury) ([place])      Dorie Rank, MD 11/30/17 2102

## 2017-11-30 NOTE — ED Notes (Signed)
Sarah, RN and this NT attempted to obtain second set of blood cultures, with pt refusing after two failed attempts.

## 2017-11-30 NOTE — Progress Notes (Signed)
Attempted to obtain second set of blood cultures. Patient refused. Patient swinging arms and saying get out.

## 2017-11-30 NOTE — ED Notes (Signed)
Bed: WA15 Expected date:  Expected time:  Means of arrival:  Comments: EMS-SOB 

## 2017-11-30 NOTE — ED Notes (Signed)
Looking at pt's MAR from SNF, pt was started on azithromycin 250 mg today for 5 days.

## 2017-11-30 NOTE — ED Notes (Signed)
ED TO INPATIENT HANDOFF REPORT  Name/Age/Gender Annette Cantrell 84 y.o. female  Code Status    Code Status Orders  (From admission, onward)         Start     Ordered   11/30/17 1827  Do not attempt resuscitation/DNR  Continuous    Question Answer Comment  In the event of cardiac or respiratory ARREST Do not call a "code blue"   In the event of cardiac or respiratory ARREST Do not perform Intubation, CPR, defibrillation or ACLS   In the event of cardiac or respiratory ARREST Use medication by any route, position, wound care, and other measures to relive pain and suffering. May use oxygen, suction and manual treatment of airway obstruction as needed for comfort.      11/30/17 1826        Code Status History    Date Active Date Inactive Code Status Order ID Comments User Context   04/26/2017 2042 04/30/2017 0100 DNR [id number]  Ivor Costa, MD ED   04/04/2013 1015 04/05/2013 1903 DNR [id number]  Knox Royalty, NP Inpatient   03/31/2013 0136 04/04/2013 1015 Full Code [id number]  Berle Mull, MD Inpatient   11/17/2012 1539 11/18/2012 1855 Full Code [id number]  Robbie Lis, MD Inpatient   01/30/2012 1712 02/04/2012 2044 DNR [id number]  Birdie Riddle, MD ED    Advance Directive Documentation     Most Recent Value  Type of Advance Directive  Out of facility DNR (pink MOST or yellow form)  Pre-existing out of facility DNR order (yellow form or pink MOST form)  Yellow form placed in chart (order not valid for inpatient use)  "MOST" Form in Place?  -      Home/SNF/Other Nursing Home  Chief Complaint shortness of breath  Level of Care/Admitting Diagnosis ED Disposition    ED Disposition Condition [place]: [facility] [[id number]]  Level of Care: Telemetry [5]  Admit to tele based on following criteria: Other see comments  Comments: hypoxia  Diagnosis: Acute respiratory failure with hypoxia [facility]) [[id number]]  Admitting Physician: Shela Leff [[id number]]  Attending Physician: Shela Leff [[id number]]  Estimated length of stay: past midnight tomorrow  Certification:: I certify this patient will need inpatient services for at least 2 midnights  PT Class (Do Not Modify): Inpatient [101]  PT Acc Code (Do Not Modify): Private [1]       Medical History Past Medical History:  Diagnosis Date  . CAD (coronary artery disease)   . Chronic systolic heart failure ([place])   . COPD 03/30/2008   Qualifier: Diagnosis of  By: [name] MD, [name]   . Dementia ([place])   . Heart disease, unspecified   . Herpes zoster 01/30/2012  . High cholesterol   . History of positive PPD, untreated   . Infected sebaceous cyst 01/25/2012  . LUNG NODULE 03/30/2008   Qualifier: Diagnosis of  By: [name] MD, [name]   . Stroke [facility])     Allergies No Known Allergies  IV Location/Drains/Wounds Patient Lines/Drains/Airways Status   Active Line/Drains/Airways    Name:   Placement date:   Placement time:   Site:   Days:   Peripheral IV 11/30/17 Left Antecubital   11/30/17    1729    Antecubital   less than 1   External Urinary Catheter   04/26/17    -    -   218   Incision 01/31/12 Hip Right   01/31/12  1524     2130   Pressure Ulcer 08/26/12 Stage I -  Intact skin with non-blanchable redness of a localized area usually over a bony prominence.   08/26/12    1600     1922          Labs/Imaging Results for orders placed or performed during the hospital encounter of 11/30/17 (from the past 48 hour(s))  CBC     Status: Abnormal   Collection Time: 11/30/17  6:47 PM  Result Value Ref Range   WBC 15.2 (H) 4.0 - 10.5 K/uL   RBC 2.51 (L) 3.87 - 5.11 MIL/uL   Hemoglobin 8.2 (L) 12.0 - 15.0 g/dL   HCT 27.8 (L) 36.0 - 46.0 %   MCV 110.8 (H) 80.0 - 100.0 fL   MCH 32.7 26.0 - 34.0 pg   MCHC 29.5 (L) 30.0 - 36.0 g/dL   RDW 12.2 11.5 - 15.5 %   Platelets 173 150 - 400 K/uL   nRBC 0.0 0.0 - 0.2 %    Comment: Performed at [facility], [place] [address] [place], [place] [address]  Basic metabolic panel     Status: Abnormal   Collection Time: 11/30/17  6:47 PM  Result Value Ref Range   Sodium 148 (H) 135 - 145 mmol/L   Potassium 4.1 3.5 - 5.1 mmol/L   Chloride 115 (H) 98 - 111 mmol/L   CO2 21 (L) 22 - 32 mmol/L   Glucose, Bld 130 (H) 70 - 99 mg/dL   BUN 73 (H) 8 - 23 mg/dL   Creatinine, Ser 2.43 (H) 0.44 - 1.00 mg/dL   Calcium 8.7 (L) 8.9 - 10.3 mg/dL   GFR calc non Af Amer 17 (L) >60 mL/min   GFR calc Af Amer 19 (L) >60 mL/min   Anion gap 12 5 - 15    Comment: Performed at [facility], [place] [address], [place], [place] [address]  Brain natriuretic peptide     Status: Abnormal   Collection Time: 11/30/17  6:47 PM  Result Value Ref Range   B Natriuretic Peptide 241.4 (H) 0.0 - 100.0 pg/mL    Comment: Performed at [facility], [place] [address], [place], [place] [address]  Troponin I - ONCE - STAT     Status: None   Collection Time: 11/30/17  6:47 PM  Result Value Ref Range   Troponin I <0.03 <0.03 ng/mL    Comment: Performed at [facility], [place] [address] [place], [place] [address]   Dg Chest 2 View  Result Date: 11/30/2017 CLINICAL DATA:  Short of breath EXAM: CHEST - 2 VIEW COMPARISON:  04/26/2017 FINDINGS: Bibasilar airspace disease could represent pneumonia or atelectasis Negative for heart failure. Thoracic aortic ectasia and atherosclerotic disease. No significant effusion. IMPRESSION: Bibasilar atelectasis/pneumonia. Electronically Signed   By: Franchot Gallo M.D.   On: 11/30/2017 19:41    Pending Labs Unresulted Labs (From admission, onward)    Start     Ordered   11/30/17 2049  Blood culture (routine x 2)  BLOOD CULTURE X 2,   STAT     11/30/17 2048          Vitals/Pain Today's Vitals   11/30/17 2000 11/30/17 2015 11/30/17 2030 11/30/17 2045  BP: (!) 103/50 (!) 103/55 (!) 117/54 (!) 117/56  Pulse: 100 100 99 100  Resp: (!) 24 20  (!) 27 (!) 27  Temp:      SpO2: 96% 95%  97% 98%    Isolation Precautions No active isolations  Medications Medications  sodium chloride 0.9 % bolus 500 mL (500 mLs Intravenous New Bag/Given 11/30/17 2102)    Followed by  0.9 %  sodium chloride infusion (1,000 mLs Intravenous New Bag/Given 11/30/17 2102)  ceFEPIme (MAXIPIME) 1 g in sodium chloride 0.9 % 100 mL IVPB (1 g Intravenous New Bag/Given 11/30/17 2123)    Mobility non-ambulatory

## 2017-11-30 NOTE — Progress Notes (Signed)
A consult was received from an ED physician for vancomycin per pharmacy dosing.  The patient's profile has been reviewed for ht/wt/allergies/indication/available labs.    A one time order has been placed for vancomycin 1000 mg IV x1.  Further antibiotics/pharmacy consults should be ordered by admitting physician if indicated.                       Thank you, Lynelle Doctor 11/30/2017  9:35 PM

## 2017-11-30 NOTE — ED Triage Notes (Signed)
Per [facility] pt from [facility] for worsening O2 level and SOB. Pt has dementia and COPD. Pt was given neb treatments at SNF without any relief. Albuterol 10, Atrovent 1mg , Solu Medrol 125mg  given in route. 20g left AC. 90% initially and now 100% on neb treatment, HR 100, CBG 134.

## 2017-11-30 NOTE — ED Notes (Signed)
Attempted to call report. Rn will call back when available

## 2017-12-01 ENCOUNTER — Other Ambulatory Visit: Payer: Self-pay

## 2017-12-01 DIAGNOSIS — N184 Chronic kidney disease, stage 4 (severe): Secondary | ICD-10-CM

## 2017-12-01 DIAGNOSIS — E870 Hyperosmolality and hypernatremia: Secondary | ICD-10-CM

## 2017-12-01 DIAGNOSIS — G40909 Epilepsy, unspecified, not intractable, without status epilepticus: Secondary | ICD-10-CM

## 2017-12-01 DIAGNOSIS — L8990 Pressure ulcer of unspecified site, unspecified stage: Secondary | ICD-10-CM

## 2017-12-01 DIAGNOSIS — N179 Acute kidney failure, unspecified: Secondary | ICD-10-CM

## 2017-12-01 DIAGNOSIS — R5381 Other malaise: Secondary | ICD-10-CM

## 2017-12-01 DIAGNOSIS — A419 Sepsis, unspecified organism: Principal | ICD-10-CM

## 2017-12-01 DIAGNOSIS — J441 Chronic obstructive pulmonary disease with (acute) exacerbation: Secondary | ICD-10-CM

## 2017-12-01 DIAGNOSIS — J181 Lobar pneumonia, unspecified organism: Secondary | ICD-10-CM

## 2017-12-01 DIAGNOSIS — J189 Pneumonia, unspecified organism: Secondary | ICD-10-CM

## 2017-12-01 LAB — BASIC METABOLIC PANEL
Anion gap: 13 (ref 5–15)
BUN: 80 mg/dL — AB (ref 8–23)
CHLORIDE: 114 mmol/L — AB (ref 98–111)
CO2: 20 mmol/L — ABNORMAL LOW (ref 22–32)
CREATININE: 2.59 mg/dL — AB (ref 0.44–1.00)
Calcium: 9.2 mg/dL (ref 8.9–10.3)
GFR calc non Af Amer: 15 mL/min — ABNORMAL LOW (ref >60)
GFR, EST AFRICAN AMERICAN: 18 mL/min — AB (ref >60)
Glucose, Bld: 149 mg/dL — ABNORMAL HIGH (ref 70–99)
POTASSIUM: 4.4 mmol/L (ref 3.5–5.1)
SODIUM: 147 mmol/L — AB (ref 135–145)

## 2017-12-01 LAB — CBC
HEMATOCRIT: 28.7 % — AB (ref 36.0–46.0)
Hemoglobin: 8.2 g/dL — ABNORMAL LOW (ref 12.0–15.0)
MCH: 32.3 pg (ref 26.0–34.0)
MCHC: 28.6 g/dL — AB (ref 30.0–36.0)
MCV: 113 fL — ABNORMAL HIGH (ref 80.0–100.0)
NRBC: 0 % (ref 0.0–0.2)
Platelets: 205 10*3/uL (ref 150–400)
RBC: 2.54 MIL/uL — ABNORMAL LOW (ref 3.87–5.11)
RDW: 12.5 % (ref 11.5–15.5)
WBC: 13.5 10*3/uL — AB (ref 4.0–10.5)

## 2017-12-01 LAB — IRON AND TIBC
Iron: 13 ug/dL — ABNORMAL LOW (ref 28–170)
SATURATION RATIOS: 8 % — AB (ref 10.4–31.8)
TIBC: 159 ug/dL — AB (ref 250–450)
UIBC: 146 ug/dL

## 2017-12-01 LAB — LACTIC ACID, PLASMA: LACTIC ACID, VENOUS: 3.4 mmol/L — AB (ref 0.5–1.9)

## 2017-12-01 LAB — FERRITIN: Ferritin: 397 ng/mL — ABNORMAL HIGH (ref 11–307)

## 2017-12-01 LAB — MRSA PCR SCREENING: MRSA BY PCR: POSITIVE — AB

## 2017-12-01 LAB — HIV ANTIBODY (ROUTINE TESTING W REFLEX): HIV Screen 4th Generation wRfx: NONREACTIVE

## 2017-12-01 LAB — PROCALCITONIN: PROCALCITONIN: 11.51 ng/mL

## 2017-12-01 MED ORDER — DEXTROSE 5 % IV SOLN
500.0000 mg | INTRAVENOUS | Status: DC
  Administered 2017-12-01: 500 mg via INTRAVENOUS
  Filled 2017-12-01 (×2): qty 0.5

## 2017-12-01 MED ORDER — CHLORHEXIDINE GLUCONATE CLOTH 2 % EX PADS
6.0000 | MEDICATED_PAD | Freq: Every day | CUTANEOUS | Status: DC
  Administered 2017-12-02 – 2017-12-05 (×4): 6 via TOPICAL

## 2017-12-01 MED ORDER — MUPIROCIN 2 % EX OINT
1.0000 "application " | TOPICAL_OINTMENT | Freq: Two times a day (BID) | CUTANEOUS | Status: DC
  Administered 2017-12-01 – 2017-12-04 (×7): 1 via NASAL
  Filled 2017-12-01: qty 22

## 2017-12-01 MED ORDER — ORAL CARE MOUTH RINSE
15.0000 mL | Freq: Two times a day (BID) | OROMUCOSAL | Status: DC
  Administered 2017-12-01: 15 mL via OROMUCOSAL

## 2017-12-01 MED ORDER — IPRATROPIUM-ALBUTEROL 0.5-2.5 (3) MG/3ML IN SOLN
3.0000 mL | Freq: Four times a day (QID) | RESPIRATORY_TRACT | Status: DC
  Administered 2017-12-01 – 2017-12-02 (×6): 3 mL via RESPIRATORY_TRACT
  Filled 2017-12-01 (×5): qty 3

## 2017-12-01 MED ORDER — IPRATROPIUM-ALBUTEROL 0.5-2.5 (3) MG/3ML IN SOLN
RESPIRATORY_TRACT | Status: AC
  Administered 2017-12-01: 3 mL via RESPIRATORY_TRACT
  Filled 2017-12-01: qty 3

## 2017-12-01 MED ORDER — VALPROATE SODIUM 500 MG/5ML IV SOLN
125.0000 mg | Freq: Four times a day (QID) | INTRAVENOUS | Status: DC
  Administered 2017-12-01 – 2017-12-04 (×14): 125 mg via INTRAVENOUS
  Filled 2017-12-01 (×18): qty 1.25

## 2017-12-01 MED ORDER — HEPARIN SODIUM (PORCINE) 5000 UNIT/ML IJ SOLN
5000.0000 [IU] | Freq: Three times a day (TID) | INTRAMUSCULAR | Status: DC
  Administered 2017-12-01 – 2017-12-05 (×11): 5000 [IU] via SUBCUTANEOUS
  Filled 2017-12-01 (×11): qty 1

## 2017-12-01 MED ORDER — METHYLPREDNISOLONE SODIUM SUCC 40 MG IJ SOLR
40.0000 mg | Freq: Every day | INTRAMUSCULAR | Status: DC
  Administered 2017-12-02: 40 mg via INTRAVENOUS
  Filled 2017-12-01: qty 1

## 2017-12-01 MED ORDER — VANCOMYCIN VARIABLE DOSE PER UNSTABLE RENAL FUNCTION (PHARMACIST DOSING): Status: DC

## 2017-12-01 NOTE — Progress Notes (Signed)
SLP Cancellation Note  Patient Details Name: Annette Cantrell MRN: [id number] DOB: [birth year]   Cancelled treatment:       Reason Eval/Treat Not Completed: Fatigue/lethargy limiting ability to participate. Per RN, pt is currently somnolent and therefore inappropriate for swallow evaluation at this time. Will continue efforts. Pt is currently NPO without nutritional support other than IV fluids.  [name], CCC-SLP Speech Language Pathologist [phone]  [name] 12/01/2017, 12:52 PM

## 2017-12-01 NOTE — H&P (Addendum)
History and Physical    Annette Cantrell WEX:[id number] DOB: 1922-08-17 DOA: 11/30/2017  PCP: Sande Brothers, MD Patient coming from: Nursing home  Chief Complaint: Shortness of breath  HPI: Annette Cantrell is a 84 y.o. female with medical history significant of CAD, chronic systolic congestive heart failure, COPD, dementia, hyperlipidemia, CVA, CKD 4 presenting to the hospital from her nursing home for evaluation of shortness of breath and worsening oxygen level.  Patient has severe dementia and does not communicate at baseline.  No history could be obtained from her.  No family available.  ED Course: She was given nebulizer treatments at SNF without any relief.  Patient received albuterol, Atrovent, Solu-Medrol 125 mg in route via EMS.  Oxygen saturation 90% initially, 100% after nebulizer treatment.  Afebrile, slightly tachycardic, tachypneic, blood pressure stable, satting well on 2 L oxygen via nasal cannula.  White count 15.2.  BNP 241.  BUN 73.  Creatinine 2.4, was 1.6 seven months ago.  I-STAT troponin negative and EKG not suggestive of ACS.  Chest x-ray showing evidence of bibasilar airspace disease which could represent pneumonia or atelectasis.  Patient received a 500 cc IV fluid bolus and was started on continuous normal saline infusion in the ED.  In addition, received vancomycin and cefepime.  Review of Systems: As per HPI otherwise 10 point review of systems negative.  Past Medical History:  Diagnosis Date  . CAD (coronary artery disease)   . Chronic systolic heart failure ([place])   . COPD 03/30/2008   Qualifier: Diagnosis of  By: [name] MD, [name]   . Dementia ([place])   . Heart disease, unspecified   . Herpes zoster 01/30/2012  . High cholesterol   . History of positive PPD, untreated   . Infected sebaceous cyst 01/25/2012  . LUNG NODULE 03/30/2008   Qualifier: Diagnosis of  By: [name] MD, [name]   . Stroke [facility])     Past Surgical History:  Procedure Laterality Date  .  HIP ARTHROPLASTY  01/31/2012   Procedure: ARTHROPLASTY BIPOLAR HIP;  Surgeon: Rozanna Box, MD;  Location: [place];  Service: Orthopedics;  Laterality: Right;     reports that she has been smoking cigarettes. She has been smoking about 0.20 packs per day. She has never used smokeless tobacco. She reports that she does not drink alcohol or use drugs.  No Known Allergies  Family History  Problem Relation Age of Onset  . Stroke Mother     Prior to Admission medications   Medication Sig Start Date End Date Taking? Authorizing Provider  acetaminophen (TYLENOL) 325 MG tablet Take 650 mg by mouth every morning.   Yes [provider]  acetaminophen (TYLENOL) 500 MG tablet Take 500 mg by mouth every 4 (four) hours as needed for moderate pain.   Yes [provider]  albuterol (PROVENTIL) (2.5 MG/3ML) 0.083% nebulizer solution Inhale 3 mLs into the lungs every 4 (four) hours as needed for wheezing or shortness of breath. 01/14/16  Yes Mesner, Corene Cornea, MD  alum & mag hydroxide-simeth ([place]) 200-200-20 MG/5ML suspension Take 30 mLs by mouth every 6 (six) hours as needed for indigestion or heartburn.   Yes [provider]  amLODipine (NORVASC) 5 MG tablet Take 1 tablet (5 mg total) by mouth daily. 04/30/17  Yes Hosie Poisson, MD  azithromycin (ZITHROMAX) 250 MG tablet Take 250 mg by mouth daily. Take for 5 Days   Yes [provider]  divalproex (DEPAKOTE SPRINKLE) 125 MG capsule Take 125 mg by mouth  4 (four) times daily.    Yes [provider]  ferrous sulfate 325 (65 FE) MG tablet Take 325 mg by mouth daily with breakfast.   Yes [provider]  guaifenesin (ROBITUSSIN) 100 MG/5ML syrup Take 200 mg by mouth 4 (four) times daily as needed for cough.   Yes [provider]  hydrocortisone 2.5 % ointment Apply 1 application topically every 6 (six) hours as needed (pain).   Yes [provider]  ipratropium-albuterol (DUONEB) 0.5-2.5 (3)  MG/3ML SOLN Take 3 mLs by nebulization every 6 (six) hours as needed (sob and cough).   Yes [provider]  lisinopril-hydrochlorothiazide (PRINZIDE,ZESTORETIC) 20-12.5 MG tablet Take 1 tablet by mouth daily.   Yes [provider]  loperamide (IMODIUM) 2 MG capsule Take 2 mg by mouth daily as needed for diarrhea or loose stools.   Yes [provider]  LORazepam (ATIVAN) 0.5 MG tablet Take 0.5 mg by mouth every 4 (four) hours as needed for anxiety.   Yes [provider]  magnesium hydroxide (MILK OF MAGNESIA) 400 MG/5ML suspension Take 30 mLs by mouth at bedtime as needed for mild constipation or moderate constipation.   Yes [provider]  mirtazapine (REMERON) 7.5 MG tablet Take 7.5 mg by mouth at bedtime.   Yes [provider]  neomycin-bacitracin-polymyxin (NEOSPORIN) ointment Apply 1 application topically daily as needed for wound care.   Yes [provider]  Nutritional Supplements (NUTRITIONAL SUPPLEMENT PO) Take 237 mLs by mouth 3 (three) times daily with meals. [place]    Yes [provider]  OLANZapine zydis (ZYPREXA) 5 MG disintegrating tablet Take 1 tablet (5 mg total) by mouth at bedtime. 04/05/13  Yes Velvet Bathe, MD  risperiDONE (RISPERDAL) 0.5 MG tablet Take 0.5 mg by mouth daily.   Yes [provider]  sertraline (ZOLOFT) 50 MG tablet Take 50 mg by mouth daily.   Yes [provider]    Physical Exam: Vitals:   11/30/17 2145 11/30/17 2200 11/30/17 2241 11/30/17 2306  BP: (!) 112/55 (!) 102/55 138/78   Pulse: 95 96 (!) 101   Resp: (!) 27 (!) 24 18   Temp:   98.3 F (36.8 C)   TempSrc:   Oral   SpO2: 97% 95% 100%   Weight: 44 kg   44.7 kg  Height: 5\' 1"  (1.549 m)   5\' 1"  (1.549 m)    Physical Exam  Constitutional: She appears well-developed and well-nourished. No distress.  HENT:  Head: Normocephalic.  Neck: Neck supple.  Cardiovascular: Normal rate, regular rhythm and intact  distal pulses.  Pulmonary/Chest: Effort normal. She has no wheezes.  Mildly reduced breath sounds and minimal rales at the bases.  Abdominal: Bowel sounds are normal.  Unable to do abdominal exam due to lack of patient cooperation.  Musculoskeletal: She exhibits no edema.  Neurological:  Very somnolent, not following commands  Skin: Skin is warm and dry. She is not diaphoretic.     Labs on Admission: I have personally reviewed following labs and imaging studies  CBC: Recent Labs  Lab 11/30/17 1847  WBC 15.2*  HGB 8.2*  HCT 27.8*  MCV 110.8*  PLT 562   Basic Metabolic Panel: Recent Labs  Lab 11/30/17 1847  NA 148*  K 4.1  CL 115*  CO2 21*  GLUCOSE 130*  BUN 73*  CREATININE 2.43*  CALCIUM 8.7*   GFR: Estimated Creatinine Clearance: 10.4 mL/min (A) (by C-G formula based on SCr of 2.43 mg/dL (H)).  Liver Function Tests: No results for input(s): AST, ALT, ALKPHOS, BILITOT, PROT, ALBUMIN in the last 168 hours. No results for input(s): LIPASE, AMYLASE in the last 168 hours. No results for input(s): AMMONIA in the last 168 hours. Coagulation Profile: No results for input(s): INR, PROTIME in the last 168 hours. Cardiac Enzymes: Recent Labs  Lab 11/30/17 1847  TROPONINI <0.03   BNP (last 3 results) No results for input(s): PROBNP in the last 8760 hours. HbA1C: No results for input(s): HGBA1C in the last 72 hours. CBG: No results for input(s): GLUCAP in the last 168 hours. Lipid Profile: No results for input(s): CHOL, HDL, LDLCALC, TRIG, CHOLHDL, LDLDIRECT in the last 72 hours. Thyroid Function Tests: No results for input(s): TSH, T4TOTAL, FREET4, T3FREE, THYROIDAB in the last 72 hours. Anemia Panel: No results for input(s): VITAMINB12, FOLATE, FERRITIN, TIBC, IRON, RETICCTPCT in the last 72 hours. Urine analysis:    Component Value Date/Time   COLORURINE YELLOW 04/26/2017 2051   APPEARANCEUR CLEAR 04/26/2017 2051   LABSPEC 1.013 04/26/2017 2051   PHURINE 6.0  04/26/2017 2051   GLUCOSEU NEGATIVE 04/26/2017 2051   HGBUR NEGATIVE 04/26/2017 2051   [place] NEGATIVE 04/26/2017 2051   [place] NEGATIVE 04/26/2017 2051   PROTEINUR 30 (A) 04/26/2017 2051   UROBILINOGEN 1.0 07/03/2013 0117   NITRITE NEGATIVE 04/26/2017 2051   LEUKOCYTESUR NEGATIVE 04/26/2017 2051    Radiological Exams on Admission: Dg Chest 2 View  Result Date: 11/30/2017 CLINICAL DATA:  Short of breath EXAM: CHEST - 2 VIEW COMPARISON:  04/26/2017 FINDINGS: Bibasilar airspace disease could represent pneumonia or atelectasis Negative for heart failure. Thoracic aortic ectasia and atherosclerotic disease. No significant effusion. IMPRESSION: Bibasilar atelectasis/pneumonia. Electronically Signed   By: Franchot Gallo M.D.   On: 11/30/2017 19:41    EKG: Independently reviewed.  Sinus rhythm, right bundle branch block (similar to prior tracing).  Assessment/Plan Principal Problem:   Sepsis ([place]) Active Problems:   Dementia ([place])   Acute respiratory failure with hypoxia (HCC)   COPD with acute exacerbation (HCC)   HTN (hypertension)   Chronic anemia   HCAP (healthcare-associated pneumonia)   AKI (acute kidney injury) (HCC)   CKD (chronic kidney disease) stage 4, GFR 15-29 ml/min (HCC)   Hypernatremia   Seizure disorder (HCC)   Physical deconditioning   Pressure injury of skin   Sepsis and acute hypoxic respiratory failure secondary to suspected HCAP and acute exacerbation of COPD Patient is a nursing home resident presenting with worsening dyspnea and hypoxia. Oxygen saturation 90% initially per EMS.  Currently satting well on 2 L oxygen via nasal cannula.  Afebrile, slightly tachycardic, tachypneic, blood pressure stable.  White count 15.2. Chest x-ray showing evidence of bibasilar airspace disease which could represent pneumonia or atelectasis.  -Continue broad-spectrum antibiotics: Vancomycin and cefepime -Patient noted to be wheezing on initial exam done in the ED.   Received IV Solu-Medrol 125 mg via EMS.  Continue IV Solu-Medrol 40 mg daily starting early morning.  Duo nebs every 6 hours. -IV fluid resuscitation -Check lactate level -Repeat CBC in a.m. -Blood culture x2 pending -Check procalcitonin level -Patient is currently very somnolent.  High aspiration risk - will keep n.p.o., aspiration precautions, order SLP eval.  AKI on CKD 4 BUN 73.  Creatinine 2.4, was 1.6 seven months ago.  Likely prerenal due to dehydration. -IV fluid hydration -Avoid nephrotoxic agents/contrast -Repeat BMP in a.m.  Chronic anemia -Stable.  Hemoglobin 8.2, was 12.1 seven months ago after patient received blood transfusions during hospitalization in  April 2019. -Check FOBT -Check iron, ferritin, TIBC  Mild hypernatremia Sodium 148, likely due to dehydration. -Continue IV fluid -Repeat BMP in a.m.  Seizure disorder -Continue valproate IV 125 mg every 6 hours  HTN Currently normotensive. -Continue hold antihypertensives in the setting of sepsis  Dementia Patient has advanced dementia and is not communicative at baseline. -Delirium precautions  Physical deconditioning -PT evaluation  Pressure injury of skin Per nursing report. -Wound care  DVT prophylaxis: Subcutaneous heparin Code Status: DNR Family Communication: No family available Disposition Plan: Anticipate discharge to nursing home in 2 to 3 days. Consults called: None Admission status: It is my clinical opinion that admission to INPATIENT is reasonable and necessary in this 84 y.o. female . presenting with symptoms of hypoxia, shortness of breath, concerning for sepsis and acute hypoxic respiratory failure secondary to suspected HCAP and acute exacerbation of COPD . in the context of PMH including: COPD, dementia . with pertinent positives on physical exam including: Very somnolent, supplemental oxygen requirement . and pertinent positives on radiographic and laboratory data including:  Leukocytosis, evidence of pneumonia on chest x-ray . Workup and treatment include IV fluid hydration as patient is n.p.o. due to high aspiration risk.  She will need IV antibiotics.   Given the aforementioned, the predictability of an adverse outcome is felt to be significant. I expect that the patient will require at least 2 midnights in the hospital to treat this condition.    Shela Leff MD [facility] Pager [phone]  If 7PM-7AM, please contact night-coverage [email or web address] Password [email or web address]  12/01/2017, 2:40 AM

## 2017-12-01 NOTE — Progress Notes (Signed)
CRITICAL VALUE ALERT  Critical Value:  Lactic 3.4  Date & Time Notied: 12/01/2017 0351  Provider Notified: [place]  Orders Received/Actions taken: awaiting orders will continue to monitor

## 2017-12-01 NOTE — Progress Notes (Addendum)
Initial Nutrition Assessment  DOCUMENTATION CODES:   (Will assess for malnutrition at follow-up, if appropriate.)  INTERVENTION:  - Diet advancement as medically feasible. - Will monitor for POC/GOC and provide interventions or recommendations based on this.    NUTRITION DIAGNOSIS:   Inadequate oral intake related to inability to eat as evidenced by NPO status.  GOAL:   Patient will meet greater than or equal to 90% of their needs  MONITOR:   Diet advancement, Weight trends, Labs, Skin  REASON FOR ASSESSMENT:   Malnutrition Screening Tool  ASSESSMENT:   84 y.o. female with medical history significant of CAD, CHF, COPD, dementia, hyperlipidemia, CVA, and CKD stage 4. She presented to the ED from nursing home for evaluation of SOB and worsening O2 sats.  Patient has severe dementia and does not communicate at baseline.  BMI indicates normal weight. Patient has been NPO since admission. Flow sheet indicates that patient is disoriented. She was sleeping at the time of RD visit with no family/visitors present. Did not feel it was necessary to awake patient at this time.   SLP attempted to see patient this afternoon but note indicates that patient was somnolent and, therefore, inappropriate for swallow evaluation.  Per chart review, current weight is 103 lb and weight on 04/27/17 was 105 lb.   Medications reviewed; 40 mg Solu-medrol/day. Labs reviewed; Na: 147 mmol/L, Cl: 114 mmol/L, BUN: 80 mg/dL, creatinine: 2.59 mg/dL, GFR: 15 mL/min.  IVF; NS @ 125 mL/hr.    NUTRITION - FOCUSED PHYSICAL EXAM:  Unable to perform as patient sleeping and no family/visitors present; will attempt at follow-up.  Diet Order:   Diet Order            Diet NPO time specified  Diet effective now              EDUCATION NEEDS:   No education needs have been identified at this time  Skin:  Skin Assessment: Skin Integrity Issues: Skin Integrity Issues:: Stage I Stage I: bilateral  feet  Last BM:  PTA/unknown  Height:   Ht Readings from Last 1 Encounters:  11/30/17 5\' 1"  (1.549 m)    Weight:   Wt Readings from Last 1 Encounters:  12/01/17 46.6 kg    Ideal Body Weight:  47.73 kg  BMI:  Body mass index is 19.41 kg/m.  Estimated Nutritional Needs:   Kcal:  1400-1630 kcal  Protein:  55-65 grams  Fluid:  >/= 1.5 L/day     Jarome Matin, MS, RD, LDN, [facility] Inpatient Clinical Dietitian Pager # [phone] After hours/weekend pager # [phone]

## 2017-12-01 NOTE — Progress Notes (Signed)
Patient is admitted this am for pneumonia, continue current treatment.

## 2017-12-01 NOTE — Progress Notes (Signed)
PT Cancellation Note / Screen  Patient Details Name: Annette Cantrell MRN: [id number] DOB: 08/23/1922   Cancelled Treatment:    Reason Eval/Treat Not Completed: PT screened, no needs identified, will sign off Per PT note in admission in April 2019, "Spoke with SW about pt's PLOF-pt was dependent for ADLs and mobility prior to admission. Per SW, PT eval likely not needed for pt to return to ALF. Plan is for pt to return to ALF at discharge."  Pt with hx of severe dementia.  PT to sign off at this time.  If PT evaluation is required prior to d/c back to ALF, please reorder.  Thanks.  Carmelia Bake, PT, DPT Acute Rehabilitation Services Office: [phone] Pager: [phone] Trena Platt 12/01/2017, 2:18 PM

## 2017-12-01 NOTE — Progress Notes (Signed)
CRITICAL VALUE ALERT  Critical Value:  PCR MRSA postive Date & Time Notied: 12/01/2017 0524   Provider Notified  Orders Received/Actions taken: standing orders started

## 2017-12-01 NOTE — Progress Notes (Signed)
Pharmacy Antibiotic Note  Annette Cantrell is a 84 y.o. female admitted on 11/30/2017 with pneumonia.  Pharmacy has been consulted for cefepime and vancomycin dosing.  Plan: Vancomycin 1 Gm x1 then f/u scr and/or random vancomycin level Goal AUC = 400-500 Cefepime 1 Gm x1 then 500 mg IV q24h  F/u scr/cultures/levels  Height: 5\' 1"  (154.9 cm) Weight: 98 lb 8.7 oz (44.7 kg) IBW/kg (Calculated) : 47.8  Temp (24hrs), Avg:98 F (36.7 C), Min:97.6 F (36.4 C), Max:98.3 F (36.8 C)  Recent Labs  Lab 11/30/17 1847 12/01/17 0215  WBC 15.2* 13.5*  CREATININE 2.43*  --     Estimated Creatinine Clearance: 10.4 mL/min (A) (by C-G formula based on SCr of 2.43 mg/dL (H)).    No Known Allergies  Antimicrobials this admission: 11/26 cefepime >>  11/16 vancomycin >>   Dose adjustments this admission:   Microbiology results:  BCx:   UCx:    Sputum:    MRSA PCR:   Thank you for allowing pharmacy to be a part of this patient's care.  Dorrene German 12/01/2017 3:06 AM

## 2017-12-02 ENCOUNTER — Inpatient Hospital Stay (HOSPITAL_COMMUNITY): Payer: Medicare Other

## 2017-12-02 DIAGNOSIS — J181 Lobar pneumonia, unspecified organism: Secondary | ICD-10-CM

## 2017-12-02 DIAGNOSIS — E870 Hyperosmolality and hypernatremia: Secondary | ICD-10-CM

## 2017-12-02 DIAGNOSIS — D649 Anemia, unspecified: Secondary | ICD-10-CM

## 2017-12-02 DIAGNOSIS — N179 Acute kidney failure, unspecified: Secondary | ICD-10-CM

## 2017-12-02 DIAGNOSIS — F0150 Vascular dementia without behavioral disturbance: Secondary | ICD-10-CM

## 2017-12-02 DIAGNOSIS — J9601 Acute respiratory failure with hypoxia: Secondary | ICD-10-CM

## 2017-12-02 DIAGNOSIS — N184 Chronic kidney disease, stage 4 (severe): Secondary | ICD-10-CM

## 2017-12-02 LAB — CBC WITH DIFFERENTIAL/PLATELET
Abs Immature Granulocytes: 0.16 10*3/uL — ABNORMAL HIGH (ref 0.00–0.07)
Basophils Absolute: 0 10*3/uL (ref 0.0–0.1)
Basophils Relative: 0 %
EOS ABS: 0 10*3/uL (ref 0.0–0.5)
EOS PCT: 0 %
HCT: 24.5 % — ABNORMAL LOW (ref 36.0–46.0)
HEMOGLOBIN: 7 g/dL — AB (ref 12.0–15.0)
Immature Granulocytes: 2 %
Lymphocytes Relative: 12 %
Lymphs Abs: 1.3 10*3/uL (ref 0.7–4.0)
MCH: 33.2 pg (ref 26.0–34.0)
MCHC: 28.6 g/dL — ABNORMAL LOW (ref 30.0–36.0)
MCV: 116.1 fL — AB (ref 80.0–100.0)
Monocytes Absolute: 0.4 10*3/uL (ref 0.1–1.0)
Monocytes Relative: 4 %
Neutro Abs: 8.4 10*3/uL — ABNORMAL HIGH (ref 1.7–7.7)
Neutrophils Relative %: 82 %
Platelets: 188 10*3/uL (ref 150–400)
RBC: 2.11 MIL/uL — AB (ref 3.87–5.11)
RDW: 12.5 % (ref 11.5–15.5)
WBC: 10.3 10*3/uL (ref 4.0–10.5)
nRBC: 0 % (ref 0.0–0.2)

## 2017-12-02 LAB — BASIC METABOLIC PANEL
Anion gap: 7 (ref 5–15)
BUN: 73 mg/dL — AB (ref 8–23)
CHLORIDE: 123 mmol/L — AB (ref 98–111)
CO2: 18 mmol/L — ABNORMAL LOW (ref 22–32)
CREATININE: 2.27 mg/dL — AB (ref 0.44–1.00)
Calcium: 8.5 mg/dL — ABNORMAL LOW (ref 8.9–10.3)
GFR calc Af Amer: 21 mL/min — ABNORMAL LOW (ref >60)
GFR calc non Af Amer: 18 mL/min — ABNORMAL LOW (ref >60)
GLUCOSE: 64 mg/dL — AB (ref 70–99)
Potassium: 4.7 mmol/L (ref 3.5–5.1)
SODIUM: 148 mmol/L — AB (ref 135–145)

## 2017-12-02 LAB — LACTIC ACID, PLASMA: LACTIC ACID, VENOUS: 0.5 mmol/L (ref 0.5–1.9)

## 2017-12-02 MED ORDER — SODIUM CHLORIDE 0.9 % IV SOLN
3.0000 g | Freq: Four times a day (QID) | INTRAVENOUS | Status: DC
  Administered 2017-12-02: 3 g via INTRAVENOUS
  Filled 2017-12-02 (×2): qty 3

## 2017-12-02 MED ORDER — HYDRALAZINE HCL 20 MG/ML IJ SOLN: 5.0000 mg | Freq: Three times a day (TID) | INTRAMUSCULAR | Status: DC | PRN

## 2017-12-02 MED ORDER — IPRATROPIUM-ALBUTEROL 0.5-2.5 (3) MG/3ML IN SOLN: 3.0000 mL | Freq: Two times a day (BID) | RESPIRATORY_TRACT | Status: DC

## 2017-12-02 MED ORDER — DEXTROSE-NACL 5-0.45 % IV SOLN
INTRAVENOUS | Status: DC
  Administered 2017-12-02: 16:00:00 via INTRAVENOUS

## 2017-12-02 MED ORDER — IPRATROPIUM-ALBUTEROL 0.5-2.5 (3) MG/3ML IN SOLN
3.0000 mL | Freq: Four times a day (QID) | RESPIRATORY_TRACT | Status: DC
  Administered 2017-12-02 – 2017-12-03 (×2): 3 mL via RESPIRATORY_TRACT
  Filled 2017-12-02 (×2): qty 3

## 2017-12-02 MED ORDER — LORAZEPAM 2 MG/ML IJ SOLN
0.5000 mg | Freq: Four times a day (QID) | INTRAMUSCULAR | Status: DC | PRN
  Administered 2017-12-02 – 2017-12-05 (×4): 0.5 mg via INTRAVENOUS
  Filled 2017-12-02 (×4): qty 1

## 2017-12-02 MED ORDER — SODIUM CHLORIDE 0.9 % IV SOLN
3.0000 g | INTRAVENOUS | Status: DC
  Administered 2017-12-03 – 2017-12-04 (×2): 3 g via INTRAVENOUS
  Filled 2017-12-02 (×2): qty 3

## 2017-12-02 NOTE — Progress Notes (Signed)
Unable to complete CT scan per Radiology.  Dr. Erlinda Hong notified via text page.

## 2017-12-02 NOTE — Evaluation (Signed)
SLP Cancellation Note  Patient Details Name: Annette Cantrell MRN: [id number] DOB: 10-04-1922   Cancelled treatment:       Reason Eval/Treat Not Completed: Fatigue/lethargy limiting ability to participate(pt with RR of 21 with accessory muscle use, marginally congested breathing noted when stirred, she did not awaken adequately for po intake)   [name] 12/02/2017, 12:46 PM  [name], [place] SLP Acute Rehab Services Pager [phone] Office [phone]

## 2017-12-02 NOTE — Progress Notes (Addendum)
Pharmacy Antibiotic Note  Annette Cantrell is a 84 y.o. female admitted on 11/30/2017 with pneumonia.  Pharmacy has been consulted for cefepime and vancomycin dosing.   12/02/17 - Cefepime changed to Unasyn to cover for possible aspiration PNA - Scr improved somewhat - WBC decreased, afebrile - Cultures negative so far  Plan: Discussed risk vs benefit of continuing vancomycin with Dr. Erlinda Hong in setting of acute on chronic renal failure with MRSA PCR positive (discussed good sensitivity for MRSA but low specificity) and Dr. Erlinda Hong wants to continue for now but reevaluate after chest film  Unasyn 3 g iv q 24 hours.  Will not order another vancomycin dose at this point as patient is covered from initial loading dose for 48-72 h F/u scr/cultures/levels  Height: 5\' 1"  (154.9 cm) Weight: 102 lb 11.8 oz (46.6 kg) IBW/kg (Calculated) : 47.8  Temp (24hrs), Avg:98.3 F (36.8 C), Min:97.9 F (36.6 C), Max:98.9 F (37.2 C)  Recent Labs  Lab 11/30/17 1847 12/01/17 0215 12/02/17 0546  WBC 15.2* 13.5* 10.3  CREATININE 2.43* 2.59* 2.27*  LATICACIDVEN  --  3.4* 0.5    Estimated Creatinine Clearance: 11.6 mL/min (A) (by C-G formula based on SCr of 2.27 mg/dL (H)).    No Known Allergies  Antimicrobials this admission: 11/26 cefepime >> 11/28 11/26 vancomycin >>  11/28 Unasyn >>  Dose adjustments this admission:   Microbiology results: 11/27 BCx: NGTD 11/27 MRSA PCR: positive  Thank you for allowing pharmacy to be a part of this patient's care.  [name] 12/02/2017 3:30 PM

## 2017-12-02 NOTE — Progress Notes (Signed)
PROGRESS NOTE  Annette Cantrell EUM:[id number] DOB: Jul 31, 1922 DOA: 11/30/2017 PCP: Sande Brothers, MD  HPI/Recap of past 24 hours:  Intermittent agitation/ yelling  Does not follow commands No fever, still has congested cough  Assessment/Plan: Principal Problem:   Sepsis ([place]) Active Problems:   Dementia ([place])   Acute respiratory failure with hypoxia ([place])   COPD with acute exacerbation ([place])   HTN (hypertension)   Chronic anemia   HCAP (healthcare-associated pneumonia)   AKI (acute kidney injury) ([place])   CKD (chronic kidney disease) stage 4, GFR 15-29 ml/min (HCC)   Hypernatremia   Seizure disorder ([place])   Physical deconditioning   Pressure injury of skin  Sepsis and acute hypoxic respiratory failure /pneumonia possible aspiration pneumonia Lactic acid normalized, tachycardia improved D/c vanc /cefepime, change to unasyn  Still has congested cough, increase nebs Get ct chest; patchy groundglass and airspace opacities in both lungs  Spiculated right upper lobe nodule was present in 2010, although has approximately tripled in volume over this interval, suspicious for slow growing neoplasm, likely adenocarcinoma.  -Will defer work-up in this elderly demented patient  AKI on CKD IV -likely prerenal -check ua/renal US -renal dosing meds, hold home med lisinopril HCTZ  Hypernatremia:  Likely from poor oral intake Start D5 half saline repeat labs in the morning  Macrocytic anemia mcv 116, hgb 7 Will check B12/folate/TSH/retake No overt bleeding, get fecal occult blood testing Transfuse as needed to keep hemoglobin greater than 7  HTN:  Home meds Norvasc /lisinopril HCTZ on hold due to not taking oral meds On iv hydralazine prn  Chronic right bundle branch block on EKG  Chronic combined CHF Last EF 45% in 2015 Currently dry  Close monitor volume status  H/o CVA/vascular Dementia/agitation/possible acute metabolic encephalopathy -not taking oral meds -on iv  depacon (h/o seizure?), add prn ativan iv (on oral ativan prn at home)   Code Status: DNR  Family Communication: patient   Disposition Plan: pending clinical improvement  May need palliative care consult if patient does not improve Consultants:  none  Procedures:  none  Antibiotics:  As above   Objective: BP (!) 141/69 (BP Location: Right Arm)   Pulse 78   Temp 97.9 F (36.6 C) (Oral)   Resp 18   Ht 5\' 1"  (1.549 m)   Wt 46.6 kg   SpO2 95%   BMI 19.41 kg/m   Intake/Output Summary (Last 24 hours) at 12/02/2017 1423 Last data filed at 12/02/2017 1200 Gross per 24 hour  Intake 2941.41 ml  Output 0 ml  Net 2941.41 ml   Filed Weights   11/30/17 2145 11/30/17 2306 12/01/17 0749  Weight: 44 kg 44.7 kg 46.6 kg    Exam: Patient is examined daily including today on 12/02/2017, exams remain the same as of yesterday except that has changed    General:  Confused, not following commands  Cardiovascular: RRR  Respiratory: + rhonchi,  No wheezing  Abdomen: Soft/ND/NT, positive BS  Musculoskeletal: No Edema  Neuro: alert, confused, intermittent agitation  Data Reviewed: Basic Metabolic Panel: Recent Labs  Lab 11/30/17 1847 12/01/17 0215 12/02/17 0546  NA 148* 147* 148*  K 4.1 4.4 4.7  CL 115* 114* 123*  CO2 21* 20* 18*  GLUCOSE 130* 149* 64*  BUN 73* 80* 73*  CREATININE 2.43* 2.59* 2.27*  CALCIUM 8.7* 9.2 8.5*   Liver Function Tests: No results for input(s): AST, ALT, ALKPHOS, BILITOT, PROT, ALBUMIN in the last 168 hours. No results for input(s): LIPASE, AMYLASE  in the last 168 hours. No results for input(s): AMMONIA in the last 168 hours. CBC: Recent Labs  Lab 11/30/17 1847 12/01/17 0215 12/02/17 0546  WBC 15.2* 13.5* 10.3  NEUTROABS  --   --  8.4*  HGB 8.2* 8.2* 7.0*  HCT 27.8* 28.7* 24.5*  MCV 110.8* 113.0* 116.1*  PLT 173 205 188   Cardiac Enzymes:   Recent Labs  Lab 11/30/17 1847  TROPONINI <0.03   BNP (last 3 results) Recent  Labs    04/26/17 2052 11/30/17 1847  BNP 711.3* 241.4*    ProBNP (last 3 results) No results for input(s): PROBNP in the last 8760 hours.  CBG: No results for input(s): GLUCAP in the last 168 hours.  Recent Results (from the past 240 hour(s))  Blood culture (routine x 2)     Status: None (Preliminary result)   Collection Time: 11/30/17  9:07 PM  Result Value Ref Range Status   Specimen Description BLOOD RIGHT ANTECUBITAL  Final   Special Requests   Final    BOTTLES DRAWN AEROBIC AND ANAEROBIC Blood Culture adequate volume   Culture   Final    NO GROWTH 1 DAY Performed at [facility], [address] [place], [place] [address]    Report Status PENDING  Incomplete  Blood culture (routine x 2)     Status: None (Preliminary result)   Collection Time: 12/01/17 12:24 AM  Result Value Ref Range Status   Specimen Description   Final    BLOOD LEFT HAND Performed at [facility], [place] [address] [place], [place] [address]    Special Requests   Final    AEROBIC BOTTLE ONLY Blood Culture adequate volume Performed at [place] [address], [place], [place] [address]    Culture   Final    NO GROWTH 1 DAY Performed at [facility], [place] [address] [place], [place] [address]    Report Status PENDING  Incomplete  MRSA PCR Screening     Status: Abnormal   Collection Time: 12/01/17  2:21 AM  Result Value Ref Range Status   MRSA by PCR POSITIVE (A) NEGATIVE Final    Comment:        The GeneXpert MRSA Assay (FDA approved for NASAL specimens only), is one component of a comprehensive MRSA colonization surveillance program. It is not intended to diagnose MRSA infection nor to guide or monitor treatment for MRSA infections. RESULT CALLED TO, READ BACK BY AND VERIFIED WITH: Marchelle Gearing RN AT 3748 12/01/17 BY TIBBITTS,K Performed at [facility] [address] [place], [place] [address]      Studies: No results  found.  Scheduled Meds: . ceFEPime (MAXIPIME) IV  500 mg Intravenous Q24H  . Chlorhexidine Gluconate Cloth  6 each Topical Q0600  . heparin  5,000 Units Subcutaneous Q8H  . ipratropium-albuterol  3 mL Nebulization BID  . mouth rinse  15 mL Mouth Rinse BID  . methylPREDNISolone (SOLU-MEDROL) injection  40 mg Intravenous Daily  . mupirocin ointment  1 application Nasal BID  . vancomycin variable dose per unstable renal function (pharmacist dosing)   Does not apply See admin instructions    Continuous Infusions: . dextrose 5 % and 0.45% NaCl    . valproate sodium 125 mg (12/02/17 1046)     Time spent: 103mins I have personally reviewed and interpreted on  12/02/2017 daily labs, tele strips, imagings as discussed above under date review session and assessment  and plans.  I reviewed all nursing notes, pharmacy notes,   vitals, pertinent old records  I have discussed plan of care as described above with RN , patient  on 12/02/2017   Florencia Reasons MD, PhD  [facility] Pager [phone]. If 7PM-7AM, please contact night-coverage at [email or web address], password [facility] 12/02/2017, 2:23 PM  LOS: 2 days

## 2017-12-02 NOTE — Progress Notes (Signed)
Late entry:  Patient pulled IV out this morning.  Yelling and cursing at staff.  RN able to restart IV and IV fluids.  Patient now resting.

## 2017-12-02 NOTE — Progress Notes (Signed)
Bladder scan volume shows 0 ml.

## 2017-12-03 ENCOUNTER — Other Ambulatory Visit: Payer: Self-pay

## 2017-12-03 ENCOUNTER — Inpatient Hospital Stay (HOSPITAL_COMMUNITY): Payer: Medicare Other

## 2017-12-03 DIAGNOSIS — J690 Pneumonitis due to inhalation of food and vomit: Secondary | ICD-10-CM

## 2017-12-03 DIAGNOSIS — R5381 Other malaise: Secondary | ICD-10-CM

## 2017-12-03 LAB — BASIC METABOLIC PANEL
Anion gap: 11 (ref 5–15)
Anion gap: 8 (ref 5–15)
BUN: 70 mg/dL — AB (ref 8–23)
BUN: 66 mg/dL — ABNORMAL HIGH (ref 8–23)
CHLORIDE: 118 mmol/L — AB (ref 98–111)
CO2: 18 mmol/L — ABNORMAL LOW (ref 22–32)
CO2: 21 mmol/L — ABNORMAL LOW (ref 22–32)
Calcium: 8.8 mg/dL — ABNORMAL LOW (ref 8.9–10.3)
Calcium: 8.8 mg/dL — ABNORMAL LOW (ref 8.9–10.3)
Chloride: 120 mmol/L — ABNORMAL HIGH (ref 98–111)
Creatinine, Ser: 2.21 mg/dL — ABNORMAL HIGH (ref 0.44–1.00)
Creatinine, Ser: 2.09 mg/dL — ABNORMAL HIGH (ref 0.44–1.00)
GFR calc Af Amer: 22 mL/min — ABNORMAL LOW (ref >60)
GFR calc Af Amer: 23 mL/min — ABNORMAL LOW (ref >60)
GFR calc non Af Amer: 19 mL/min — ABNORMAL LOW (ref >60)
GFR calc non Af Amer: 20 mL/min — ABNORMAL LOW (ref >60)
GLUCOSE: 82 mg/dL (ref 70–99)
Glucose, Bld: 95 mg/dL (ref 70–99)
Potassium: 4.8 mmol/L (ref 3.5–5.1)
Potassium: 4.4 mmol/L (ref 3.5–5.1)
Sodium: 149 mmol/L — ABNORMAL HIGH (ref 135–145)
Sodium: 147 mmol/L — ABNORMAL HIGH (ref 135–145)

## 2017-12-03 LAB — CBC WITH DIFFERENTIAL/PLATELET
Abs Immature Granulocytes: 0.22 10*3/uL — ABNORMAL HIGH (ref 0.00–0.07)
Basophils Absolute: 0 10*3/uL (ref 0.0–0.1)
Basophils Relative: 1 %
Eosinophils Absolute: 0 10*3/uL (ref 0.0–0.5)
Eosinophils Relative: 1 %
HEMATOCRIT: 24.4 % — AB (ref 36.0–46.0)
Hemoglobin: 7 g/dL — ABNORMAL LOW (ref 12.0–15.0)
Immature Granulocytes: 3 %
Lymphocytes Relative: 17 %
Lymphs Abs: 1.4 10*3/uL (ref 0.7–4.0)
MCH: 32.9 pg (ref 26.0–34.0)
MCHC: 28.7 g/dL — ABNORMAL LOW (ref 30.0–36.0)
MCV: 114.6 fL — AB (ref 80.0–100.0)
MONOS PCT: 6 %
Monocytes Absolute: 0.5 10*3/uL (ref 0.1–1.0)
Neutro Abs: 6.3 10*3/uL (ref 1.7–7.7)
Neutrophils Relative %: 72 %
Platelets: 232 10*3/uL (ref 150–400)
RBC: 2.13 MIL/uL — ABNORMAL LOW (ref 3.87–5.11)
RDW: 12.5 % (ref 11.5–15.5)
WBC: 8.4 10*3/uL (ref 4.0–10.5)
nRBC: 0.5 % — ABNORMAL HIGH (ref 0.0–0.2)

## 2017-12-03 LAB — RETICULOCYTES
Immature Retic Fract: 16.3 % — ABNORMAL HIGH (ref 2.3–15.9)
RBC.: 2.13 MIL/uL — ABNORMAL LOW (ref 3.87–5.11)
Retic Count, Absolute: 32.6 10*3/uL (ref 19.0–186.0)
Retic Ct Pct: 1.5 % (ref 0.4–3.1)

## 2017-12-03 LAB — URINALYSIS, ROUTINE W REFLEX MICROSCOPIC
Bilirubin Urine: NEGATIVE
Glucose, UA: NEGATIVE mg/dL
Ketones, ur: 5 mg/dL — AB
Leukocytes, UA: NEGATIVE
Nitrite: NEGATIVE
Protein, ur: NEGATIVE mg/dL
Specific Gravity, Urine: 1.014 (ref 1.005–1.030)
pH: 7 (ref 5.0–8.0)

## 2017-12-03 LAB — FOLATE: Folate: 13.2 ng/mL (ref >5.9)

## 2017-12-03 LAB — OCCULT BLOOD X 1 CARD TO LAB, STOOL: Fecal Occult Bld: POSITIVE — AB

## 2017-12-03 LAB — VITAMIN B12: Vitamin B-12: 1317 pg/mL — ABNORMAL HIGH (ref 180–914)

## 2017-12-03 LAB — TSH: TSH: 0.215 u[IU]/mL — ABNORMAL LOW (ref 0.350–4.500)

## 2017-12-03 MED ORDER — IPRATROPIUM-ALBUTEROL 0.5-2.5 (3) MG/3ML IN SOLN
3.0000 mL | Freq: Three times a day (TID) | RESPIRATORY_TRACT | Status: DC
  Administered 2017-12-03 – 2017-12-05 (×7): 3 mL via RESPIRATORY_TRACT
  Filled 2017-12-03 (×7): qty 3

## 2017-12-03 MED ORDER — ALBUTEROL SULFATE (2.5 MG/3ML) 0.083% IN NEBU: 2.5000 mg | INHALATION_SOLUTION | RESPIRATORY_TRACT | Status: DC | PRN

## 2017-12-03 MED ORDER — DEXTROSE 5 % IV SOLN
INTRAVENOUS | Status: AC
  Administered 2017-12-03 – 2017-12-04 (×2): via INTRAVENOUS

## 2017-12-03 NOTE — Progress Notes (Signed)
Dr. Erlinda Hong text paged and made aware of hemoccult positive x1.

## 2017-12-03 NOTE — Progress Notes (Signed)
PT Cancellation Note  Patient Details Name: Annette Cantrell MRN: [id number] DOB: 1922-04-30   Cancelled Treatment:    Reason Eval/Treat Not Completed: Other (comment)(per prior PT note 12/01/17, pt is dependent for ADLs and mobility at her ALF.  RN reports pt is agitated and doesn't follow commands. RN to check with case management to see if pt can return to ALF and if PT note is needed. )Will follow.    Blondell Reveal Kistler PT 12/03/2017  Acute Rehabilitation Services Pager [phone] Office [phone]

## 2017-12-03 NOTE — Progress Notes (Signed)
Modified Barium Swallow Progress Note  Patient Details  Name: Annette Cantrell MRN: [id number] Date of Birth: [birth year]  Today's Date: 12/03/2017  Modified Barium Swallow completed.  Full report located under Chart Review in the Imaging Section.  Brief recommendations include the following:  Clinical Impression  Pt presents with mild oropharyngeal dysphagia with NO aspiration or penetration of all boluses given.  Boluses were limited in number due to pt agitation and swearing.   Delayed pharyngeal swallow to pyriform sinus noted across all consistencies.  Oral residuals prematurely spilled to pyriform with delayed swallow but adequate clearance.  Pharyngeal swallow is strong without residuals.  Of note, pt did NOT cough during evaluation therefore difficult to rule out some aspiration during po *suspect would be due to oral residuals.  SLP will follow up to educate family to findings/aspiration precautions.     Swallow Evaluation Recommendations       SLP Diet Recommendations: Dysphagia 3 (Mech soft) solids;Thin liquid   Liquid Administration via: Cup;Straw   Medication Administration: Crushed with puree   Supervision: Full supervision/cueing for compensatory strategies   Compensations: Slow rate;Small sips/bites       Oral Care Recommendations: Oral care QID     [name], MS [facility] SLP Acute Rehab Services Pager [phone] Office [phone]    [name] 12/03/2017,3:16 PM

## 2017-12-03 NOTE — Progress Notes (Signed)
PROGRESS NOTE  Annette Cantrell GXQ:[id number] DOB: Dec 04, 1922 DOA: 11/30/2017 PCP: Sande Brothers, MD  HPI/Recap of past 24 hours:  Does open her eyes Does not follow commands has intermittent agitation No fever, still has congested cough  Assessment/Plan: Principal Problem:   Sepsis ([place]) Active Problems:   Dementia ([place])   Acute respiratory failure with hypoxia ([place])   COPD with acute exacerbation ([place])   HTN (hypertension)   Chronic anemia   Pneumonia of both lower lobes due to infectious organism ([place])   AKI (acute kidney injury) ([place])   CKD (chronic kidney disease) stage 4, GFR 15-29 ml/min (HCC)   Hypernatremia   Seizure disorder (HCC)   Physical deconditioning   Pressure injury of skin  Sepsis and acute hypoxic respiratory failure /pneumonia possible aspiration pneumonia Lactic acid normalized, tachycardia improved D/c vanc /cefepime, change to unasyn  Still has congested cough, increase nebs ct chest; patchy groundglass and airspace opacities in both lungs  Spiculated right upper lobe nodule was present in 2010, although has approximately tripled in volume over this interval, suspicious for slow growing neoplasm, likely adenocarcinoma.  -Will defer work-up in this elderly demented patient  AKI on CKD IV -likely prerenal -check ua/renal US -renal dosing meds, hold home med lisinopril HCTZ  Hypernatremia:  Likely from poor oral intake Sodium remains elevated despite D5 half saline  Change IV fluids to D5 only repeat labs in the morning  Macrocytic anemia mcv 116, hgb 7 B12/folate unremarkable /TSH suppressed retic inappropriately low No overt bleeding,  fecal occult blood + Transfuse as needed to keep hemoglobin greater than 7  HTN:  Home meds Norvasc /lisinopril HCTZ on hold due to not taking oral meds On iv hydralazine prn  Chronic right bundle branch block on EKG  Chronic combined CHF Last EF 45% in 2015 Currently dry  Close monitor volume  status  H/o CVA/vascular Dementia/agitation/possible acute metabolic encephalopathy -not taking oral meds -on iv depacon (h/o seizure?), add prn ativan iv (on oral ativan prn at home)  Poor prognosis, I have not seem improvement since admission, will get palliative care consult for goals of care.  Code Status: DNR  Family Communication: patient   Disposition Plan: pending clinical improvement/palliative care input   Consultants:  Palliative care  Procedures:  Modified barium swallow  Antibiotics:  As above   Objective: BP 136/65 (BP Location: Left Arm)   Pulse 69   Temp 98.1 F (36.7 C) (Oral)   Resp 20   Ht 5\' 1"  (1.549 m)   Wt 46.6 kg   SpO2 97%   BMI 19.41 kg/m   Intake/Output Summary (Last 24 hours) at 12/03/2017 1049 Last data filed at 12/03/2017 0959 Gross per 24 hour  Intake 2368.24 ml  Output -  Net 2368.24 ml   Filed Weights   11/30/17 2145 11/30/17 2306 12/01/17 0749  Weight: 44 kg 44.7 kg 46.6 kg    Exam: Patient is examined daily including today on 12/03/2017, exams remain the same as of yesterday except that has changed    General:  Confused, not following commands  Cardiovascular: RRR  Respiratory: less  rhonchi,  No wheezing  Abdomen: Soft/ND/NT, positive BS  Musculoskeletal: No Edema  Neuro: alert, confused, intermittent agitation  Data Reviewed: Basic Metabolic Panel: Recent Labs  Lab 11/30/17 1847 12/01/17 0215 12/02/17 0546 12/03/17 0551  NA 148* 147* 148* 149*  K 4.1 4.4 4.7 4.8  CL 115* 114* 123* 120*  CO2 21* 20* 18* 18*  GLUCOSE 130* 149*  64* 82  BUN 73* 80* 73* 70*  CREATININE 2.43* 2.59* 2.27* 2.21*  CALCIUM 8.7* 9.2 8.5* 8.8*   Liver Function Tests: No results for input(s): AST, ALT, ALKPHOS, BILITOT, PROT, ALBUMIN in the last 168 hours. No results for input(s): LIPASE, AMYLASE in the last 168 hours. No results for input(s): AMMONIA in the last 168 hours. CBC: Recent Labs  Lab 11/30/17 1847  12/01/17 0215 12/02/17 0546 12/03/17 0551  WBC 15.2* 13.5* 10.3 8.4  NEUTROABS  --   --  8.4* 6.3  HGB 8.2* 8.2* 7.0* 7.0*  HCT 27.8* 28.7* 24.5* 24.4*  MCV 110.8* 113.0* 116.1* 114.6*  PLT 173 205 188 232   Cardiac Enzymes:   Recent Labs  Lab 11/30/17 1847  TROPONINI <0.03   BNP (last 3 results) Recent Labs    04/26/17 2052 11/30/17 1847  BNP 711.3* 241.4*    ProBNP (last 3 results) No results for input(s): PROBNP in the last 8760 hours.  CBG: No results for input(s): GLUCAP in the last 168 hours.  Recent Results (from the past 240 hour(s))  Blood culture (routine x 2)     Status: None (Preliminary result)   Collection Time: 11/30/17  9:07 PM  Result Value Ref Range Status   Specimen Description BLOOD RIGHT ANTECUBITAL  Final   Special Requests   Final    BOTTLES DRAWN AEROBIC AND ANAEROBIC Blood Culture adequate volume   Culture   Final    NO GROWTH 2 DAYS Performed at [facility], [address], [place], [place] [address]    Report Status PENDING  Incomplete  Blood culture (routine x 2)     Status: None (Preliminary result)   Collection Time: 12/01/17 12:24 AM  Result Value Ref Range Status   Specimen Description   Final    BLOOD LEFT HAND Performed at [facility], [place] [address] [place], [place] [address]    Special Requests   Final    AEROBIC BOTTLE ONLY Blood Culture adequate volume Performed at [place] [address], [place], [place] [address]    Culture   Final    NO GROWTH 2 DAYS Performed at [place] [facility] [address] [place], [place] [address]    Report Status PENDING  Incomplete  MRSA PCR Screening     Status: Abnormal   Collection Time: 12/01/17  2:21 AM  Result Value Ref Range Status   MRSA by PCR POSITIVE (A) NEGATIVE Final    Comment:        The GeneXpert MRSA Assay (FDA approved for NASAL specimens only), is one component of a comprehensive MRSA colonization surveillance program. It  is not intended to diagnose MRSA infection nor to guide or monitor treatment for MRSA infections. RESULT CALLED TO, READ BACK BY AND VERIFIED WITH: Marchelle Gearing RN AT 5597 12/01/17 BY TIBBITTS,K Performed at [facility] [address] [place], [place] [address]      Studies: Ct Chest Wo Contrast  Result Date: 12/02/2017 CLINICAL DATA:  Pneumonia. History of COPD, hypertension, chronic kidney disease and dementia. EXAM: CT CHEST WITHOUT CONTRAST TECHNIQUE: Multidetector CT imaging of the chest was performed following the standard protocol without IV contrast. COMPARISON:  Radiographs 11/30/2017 and 04/28/2017. Abdominal CT 04/26/2017, PET-CT 04/10/2008 and chest CT 03/23/2008. FINDINGS: Cardiovascular: There is extensive atherosclerosis of the aorta, great vessels and coronary arteries. The thoracic aorta is tortuous with dilatation of the aortic arch to 3.7 cm on sagittal  image 86/7. No displaced intimal calcification or mediastinal hematoma identified. The heart size is normal. There is no pericardial effusion. Mediastinum/Nodes: There are no enlarged mediastinal, hilar or axillary lymph nodes.Small mediastinal lymph nodes are stable. There is mild heterogeneity of the thyroid gland without dominant nodule. Lungs/Pleura: There are small dependent pleural effusions with adjacent mild atelectasis bilaterally. There is mild emphysema with diffuse central airway thickening. Spiculated right upper lobe mass has enlarged from 2010, measuring up to 1.9 x 1.9 cm on image 49/5. There are additional patchy ground-glass and airspace opacities in both lungs which are likely inflammatory. There is some associated architectural distortion and bronchiectasis. Upper abdomen: The visualized upper abdomen appears stable without acute findings. Musculoskeletal/Chest wall: There is no chest wall mass or suspicious osseous finding. IMPRESSION: 1. Spiculated right upper lobe nodule was present in  2010, although has approximately tripled in volume over this interval, suspicious for slow growing neoplasm, likely adenocarcinoma. 2. Patchy ground-glass and airspace opacities in both lungs are likely inflammatory. 3. Small bilateral pleural effusions. 4. No evidence of adenopathy. 5. Severe coronary and Aortic Atherosclerosis (ICD10-I70.0). Electronically Signed   By: Richardean Sale M.D.   On: 12/02/2017 15:23    Scheduled Meds: . Chlorhexidine Gluconate Cloth  6 each Topical Q0600  . heparin  5,000 Units Subcutaneous Q8H  . ipratropium-albuterol  3 mL Nebulization TID  . mouth rinse  15 mL Mouth Rinse BID  . mupirocin ointment  1 application Nasal BID    Continuous Infusions: . ampicillin-sulbactam (UNASYN) IV    . dextrose 75 mL/hr at 12/03/17 0959  . valproate sodium 125 mg (12/03/17 1034)     Time spent: 70mins I have personally reviewed and interpreted on  12/03/2017 daily labs,  imagings as discussed above under date review session and assessment and plans.  I reviewed all nursing notes, pharmacy notes,   vitals, pertinent old records  I have discussed plan of care as described above with RN , patient  on 12/03/2017   Florencia Reasons MD, PhD  [facility] Pager [phone]. If 7PM-7AM, please contact night-coverage at [email or web address], password [facility] 12/03/2017, 10:49 AM  LOS: 3 days

## 2017-12-03 NOTE — Care Management Important Message (Signed)
Important Message  Patient Details  Name: Annette Cantrell MRN: [id number] Date of Birth: [birth year]   Medicare Important Message Given:  Yes    Kerin Salen 12/03/2017, 10:54 AMImportant Message  Patient Details  Name: Annette Cantrell MRN: [id number] Date of Birth: [birth year]   Medicare Important Message Given:  Yes    Kerin Salen 12/03/2017, 10:54 AM

## 2017-12-03 NOTE — Evaluation (Signed)
Clinical/Bedside Swallow Evaluation Patient Details  Name: Somaly Marteney MRN: [id number] Date of Birth: 02-01-1922  Today's Date: 12/03/2017 Time: SLP Start Time (ACUTE ONLY): 1250 SLP Stop Time (ACUTE ONLY): 1326 SLP Time Calculation (min) (ACUTE ONLY): 36 min  Past Medical History:  Past Medical History:  Diagnosis Date  . CAD (coronary artery disease)   . Chronic systolic heart failure ([place])   . COPD 03/30/2008   Qualifier: Diagnosis of  By: [name] MD, [name]   . Dementia ([place])   . Heart disease, unspecified   . Herpes zoster 01/30/2012  . High cholesterol   . History of positive PPD, untreated   . Infected sebaceous cyst 01/25/2012  . LUNG NODULE 03/30/2008   Qualifier: Diagnosis of  By: [name] MD, [name]   . Stroke [facility])    Past Surgical History:  Past Surgical History:  Procedure Laterality Date  . HIP ARTHROPLASTY  01/31/2012   Procedure: ARTHROPLASTY BIPOLAR HIP;  Surgeon: Rozanna Box, MD;  Location: [facility];  Service: Orthopedics;  Laterality: Right;   HPI:  84 yo female with h/o dementia adm to [facility] with difficulty swallowing, coughing.  Pt with reported decline in function and weight loss.  She is a DNR but is not comfort care at this time.  Pt has been lethargic since admit but today is alert.     Assessment / Plan / Recommendation Clinical Impression  Pt without indications of aspiration or airway compromise with intake of water, applesauce, Ensure or graham cracker.  Her voice remained clear when she voiced.  Swallow appeared timely wtih occasional multiple swallows.  However when SLP returned to room to post signs, SLP noted pt overtly coughing with wet laryngeal sounds.  MBS indicated to allow instrumental evaluation.   SLP Visit Diagnosis: Dysphagia, unspecified (R13.10)    Aspiration Risk  Severe aspiration risk;Risk for inadequate nutrition/hydration    Diet Recommendation NPO   Medication Administration: Whole meds with puree Compensations: Slow  rate;Small sips/bites Postural Changes: Seated upright at 90 degrees;Remain upright for at least 30 minutes after po intake    Other  Recommendations Oral Care Recommendations: Oral care BID   Follow up Recommendations        Frequency and Duration            Prognosis        Swallow Study   General Date of Onset: 12/03/17 HPI: 84 yo female with h/o dementia adm to [facility] with difficulty swallowing, coughing.  Pt with reported decline in function and weight loss.  She is a DNR but is not comfort care at this time.  Pt has been lethargic since admit but today is alert.   Type of Study: Bedside Swallow Evaluation Diet Prior to this Study: NPO Temperature Spikes Noted: No Respiratory Status: Nasal cannula History of Recent Intubation: No Behavior/Cognition: Alert;Doesn't follow directions Oral Cavity Assessment: Dry Oral Cavity - Dentition: Other (Comment) Self-Feeding Abilities: Total assist Patient Positioning: Upright in bed Baseline Vocal Quality: Normal Volitional Cough: Cognitively unable to elicit Volitional Swallow: Unable to elicit    Oral/Motor/Sensory Function Overall Oral Motor/Sensory Function: Generalized oral weakness   Ice Chips Ice chips: Not tested   Thin Liquid Thin Liquid: Impaired Oral Phase Functional Implications: Left anterior spillage Pharyngeal  Phase Impairments: Multiple swallows    Nectar Thick Nectar Thick Liquid: Not tested   Honey Thick Honey Thick Liquid: Not tested   Puree Puree: Within functional limits   Solid     Solid: Impaired  Oral Phase Impairments: Impaired mastication Pharyngeal Phase Impairments: Suspected delayed Swallow      [name] 12/03/2017,2:39 PM   [name], [facility] SLP Acute Rehab Services Pager [phone] Office [phone]

## 2017-12-03 NOTE — Progress Notes (Signed)
BSE completed, intially pt tolerated po well without indications of aspiration.  However after evaluation and SLP went to hang signs, pt overtly coughing and sounded congested at her larynx.  Clear below this region per RN.  Messaged Dr Erlinda Hong and will plan MBS to allow instrumental evaluation.  [name], [facility] SLP Acute Rehab Services Pager [phone] Office [phone]

## 2017-12-04 DIAGNOSIS — G40909 Epilepsy, unspecified, not intractable, without status epilepticus: Secondary | ICD-10-CM

## 2017-12-04 DIAGNOSIS — Z515 Encounter for palliative care: Secondary | ICD-10-CM

## 2017-12-04 DIAGNOSIS — Z7189 Other specified counseling: Secondary | ICD-10-CM

## 2017-12-04 LAB — CBC WITH DIFFERENTIAL/PLATELET
Abs Immature Granulocytes: 0.55 10*3/uL — ABNORMAL HIGH (ref 0.00–0.07)
BASOS PCT: 1 %
Basophils Absolute: 0 10*3/uL (ref 0.0–0.1)
Eosinophils Absolute: 0.1 10*3/uL (ref 0.0–0.5)
Eosinophils Relative: 2 %
HCT: 24.4 % — ABNORMAL LOW (ref 36.0–46.0)
Hemoglobin: 7.2 g/dL — ABNORMAL LOW (ref 12.0–15.0)
Immature Granulocytes: 9 %
Lymphocytes Relative: 24 %
Lymphs Abs: 1.4 10*3/uL (ref 0.7–4.0)
MCH: 33.5 pg (ref 26.0–34.0)
MCHC: 29.5 g/dL — ABNORMAL LOW (ref 30.0–36.0)
MCV: 113.5 fL — ABNORMAL HIGH (ref 80.0–100.0)
MONOS PCT: 7 %
Monocytes Absolute: 0.4 10*3/uL (ref 0.1–1.0)
NEUTROS PCT: 57 %
Neutro Abs: 3.4 10*3/uL (ref 1.7–7.7)
Platelets: 228 10*3/uL (ref 150–400)
RBC: 2.15 MIL/uL — ABNORMAL LOW (ref 3.87–5.11)
RDW: 12.4 % (ref 11.5–15.5)
WBC: 5.9 10*3/uL (ref 4.0–10.5)
nRBC: 0 % (ref 0.0–0.2)

## 2017-12-04 LAB — BASIC METABOLIC PANEL
Anion gap: 9 (ref 5–15)
BUN: 51 mg/dL — ABNORMAL HIGH (ref 8–23)
CALCIUM: 8.8 mg/dL — AB (ref 8.9–10.3)
CO2: 20 mmol/L — ABNORMAL LOW (ref 22–32)
Chloride: 115 mmol/L — ABNORMAL HIGH (ref 98–111)
Creatinine, Ser: 1.82 mg/dL — ABNORMAL HIGH (ref 0.44–1.00)
GFR calc Af Amer: 27 mL/min — ABNORMAL LOW (ref >60)
GFR calc non Af Amer: 24 mL/min — ABNORMAL LOW (ref >60)
Glucose, Bld: 95 mg/dL (ref 70–99)
Potassium: 4.2 mmol/L (ref 3.5–5.1)
Sodium: 144 mmol/L (ref 135–145)

## 2017-12-04 LAB — T4, FREE: Free T4: 0.94 ng/dL (ref 0.82–1.77)

## 2017-12-04 MED ORDER — DIVALPROEX SODIUM 125 MG PO CSDR
125.0000 mg | DELAYED_RELEASE_CAPSULE | Freq: Four times a day (QID) | ORAL | Status: DC
  Administered 2017-12-04: 125 mg via ORAL
  Filled 2017-12-04 (×5): qty 1

## 2017-12-04 MED ORDER — OLANZAPINE 5 MG PO TBDP
5.0000 mg | ORAL_TABLET | Freq: Every day | ORAL | Status: DC
  Administered 2017-12-04: 5 mg via ORAL
  Filled 2017-12-04: qty 1

## 2017-12-04 NOTE — Discharge Summary (Signed)
Discharge Summary  Annette Cantrell GMW:[id number] DOB: 1922-08-04  PCP: Sande Brothers, MD  Admit date: 11/30/2017 Discharge date: 12/05/2017  Time spent: 93mins, more than 50% time spent on coordination of care.  Discharge to residential hospice on full comfort measures  Discharge Diagnoses:  Active Hospital Problems   Diagnosis Date Noted  . Sepsis ([place]) 04/26/2017  . Palliative care by specialist   . Goals of care, counseling/discussion   . Pneumonia of both lower lobes due to infectious organism ([place]) 12/01/2017  . AKI (acute kidney injury) ([place]) 12/01/2017  . CKD (chronic kidney disease) stage 4, GFR 15-29 ml/min (HCC) 12/01/2017  . Hypernatremia 12/01/2017  . Seizure disorder ([place]) 12/01/2017  . Physical deconditioning 12/01/2017  . Pressure injury of skin 12/01/2017  . Recurrent aspiration pneumonia ([place]) 04/26/2017  . Acute respiratory failure with hypoxia ([place]) 11/17/2012  . COPD with acute exacerbation ([place]) 11/17/2012  . Chronic anemia 11/17/2012  . HTN (hypertension) 11/17/2012  . Dementia [facility])     Resolved Hospital Problems  No resolved problems to display.    Discharge Condition: stable  Diet recommendation: comfort feeds  Filed Weights   11/30/17 2145 11/30/17 2306 12/01/17 0749  Weight: 44 kg 44.7 kg 46.6 kg    History of present illness: (per admitting MD Dr Marlowe Sax) PCP: Sande Brothers, MD Patient coming from: Nursing home  Chief Complaint: Shortness of breath  HPI: Annette Cantrell is a 84 y.o. female with medical history significant of CAD, chronic systolic congestive heart failure, COPD, dementia, hyperlipidemia, CVA, CKD 4 presenting to the hospital from her nursing home for evaluation of shortness of breath and worsening oxygen level.  Patient has severe dementia and does not communicate at baseline.  No history could be obtained from her.  No family available.  ED Course: She was given nebulizer treatments at SNF without any relief.   Patient received albuterol, Atrovent, Solu-Medrol 125 mg in route via EMS.  Oxygen saturation 90% initially, 100% after nebulizer treatment.  Afebrile, slightly tachycardic, tachypneic, blood pressure stable, satting well on 2 L oxygen via nasal cannula.  White count 15.2.  BNP 241.  BUN 73.  Creatinine 2.4, was 1.6 seven months ago.  I-STAT troponin negative and EKG not suggestive of ACS.  Chest x-ray showing evidence of bibasilar airspace disease which could represent pneumonia or atelectasis.  Patient received a 500 cc IV fluid bolus and was started on continuous normal saline infusion in the ED.  In addition, received vancomycin and cefepime.  Hospital Course:  Principal Problem:   Sepsis ([place]) Active Problems:   Dementia ([place])   Acute respiratory failure with hypoxia (HCC)   COPD with acute exacerbation (HCC)   HTN (hypertension)   Chronic anemia   Recurrent aspiration pneumonia (HCC)   Pneumonia of both lower lobes due to infectious organism ([place])   AKI (acute kidney injury) ([place])   CKD (chronic kidney disease) stage 4, GFR 15-29 ml/min (HCC)   Hypernatremia   Seizure disorder (HCC)   Physical deconditioning   Pressure injury of skin   Palliative care by specialist   Goals of care, counseling/discussion  Sepsisand acute hypoxic respiratory failure/pneumonia possible aspiration pneumonia Lactic acid normalized, tachycardia improved She received iv vanc /cefepime initially then unasyn , wbc normalized  ct chest; patchy groundglass and airspace opacities in both lungs  Spiculated right upper lobe nodule was present in 2010, although has approximately tripled in volume over this interval, suspicious for slow growing neoplasm, likely adenocarcinoma.  -Will defer work-up in this  elderly demented patient  D/c to residential hospice with full comfort measures  AKI on CKD IV -likely prerenal -renal US "Atrophic, echogenic kidneys compatible with chronic medical renal disease. No  acute findings." -renal dosing meds, hold home med lisinopril HCTZ -Cr improved   Hypernatremia:  Likely from poor oral intake Sodium remains elevated despite D5 half saline  Sodium normalized after receiving IV fluids to D5 only   Macrocytic anemia mcv 116, hgb 7 B12/folate unremarkable /TSH suppressed retic inappropriately low No overt bleeding,  fecal occult blood + Transition to comfort measures  HTN:  Home meds Norvasc /lisinopril HCTZ on hold due to not taking oral meds On iv hydralazine prn  Chronic right bundle branch block on EKG  Chronic combined CHF Last EF 45% in 2015 Currently dry , she does not eat much  H/o CVA/vascular Dementia/agitation/possible acute metabolic encephalopathy -not taking home oral meds (depakote, zyprexa,resperdal, ativan) -she received iv depacon (h/o seizure?),  prn ativan iv (on oral ativan prn at home) in the hospital  Poor prognosis, I have not seem improvement since admission,  palliative care consult for goals of care, transition to comfort measures and residential hospital placement  Code Status: DNR  Family Communication: patient   Disposition Plan: residential hospice   Consultants:  Palliative care  hospice  Procedures:  Modified barium swallow  Antibiotics:  As above  Discharge Exam: BP 134/83 (BP Location: Left Arm)   Pulse 66   Temp 97.6 F (36.4 C) (Oral)   Resp 20   Ht 5\' 1"  (1.549 m)   Wt 46.6 kg   SpO2 91%   BMI 19.41 kg/m   General: NAD, sleeping Cardiovascular: RRR Respiratory: CTABL  Discharge Instructions You were cared for by a hospitalist during your hospital stay. If you have any questions about your discharge medications or the care you received while you were in the hospital after you are discharged, you can call the unit and asked to speak with the hospitalist on call if the hospitalist that took care of you is not available. Once you are discharged, your primary care  physician will handle any further medical issues. Please note that NO REFILLS for any discharge medications will be authorized once you are discharged, as it is imperative that you return to your primary care physician (or establish a relationship with a primary care physician if you do not have one) for your aftercare needs so that they can reassess your need for medications and monitor your lab values.  Discharge Instructions    Diet general   Complete by:  As directed    Comfort feeds   Increase activity slowly   Complete by:  As directed      Allergies as of 12/05/2017   No Known Allergies     Medication List    STOP taking these medications   acetaminophen 325 MG tablet Commonly known as:  TYLENOL   acetaminophen 500 MG tablet Commonly known as:  TYLENOL   albuterol (2.5 MG/3ML) 0.083% nebulizer solution Commonly known as:  PROVENTIL   amLODipine 5 MG tablet Commonly known as:  NORVASC   azithromycin 250 MG tablet Commonly known as:  ZITHROMAX   divalproex 125 MG capsule Commonly known as:  DEPAKOTE SPRINKLE   ferrous sulfate 325 (65 FE) MG tablet   guaifenesin 100 MG/5ML syrup Commonly known as:  ROBITUSSIN   hydrocortisone 2.5 % ointment   ipratropium-albuterol 0.5-2.5 (3) MG/3ML Soln Commonly known as:  DUONEB   lisinopril-hydrochlorothiazide 20-12.5 MG  tablet Commonly known as:  PRINZIDE,ZESTORETIC   loperamide 2 MG capsule Commonly known as:  IMODIUM   LORazepam 0.5 MG tablet Commonly known as:  ATIVAN   magnesium hydroxide 400 MG/5ML suspension Commonly known as:  MILK OF MAGNESIA   MINTOX 200-200-20 MG/5ML suspension Generic drug:  alum & mag hydroxide-simeth   mirtazapine 7.5 MG tablet Commonly known as:  REMERON   neomycin-bacitracin-polymyxin ointment Commonly known as:  NEOSPORIN   NUTRITIONAL SUPPLEMENT PO   OLANZapine zydis 5 MG disintegrating tablet Commonly known as:  ZYPREXA   risperiDONE 0.5 MG tablet Commonly known as:   RISPERDAL   sertraline 50 MG tablet Commonly known as:  ZOLOFT      No Known Allergies    The results of significant diagnostics from this hospitalization (including imaging, microbiology, ancillary and laboratory) are listed below for reference.    Significant Diagnostic Studies: Dg Chest 2 View  Result Date: 11/30/2017 CLINICAL DATA:  Short of breath EXAM: CHEST - 2 VIEW COMPARISON:  04/26/2017 FINDINGS: Bibasilar airspace disease could represent pneumonia or atelectasis Negative for heart failure. Thoracic aortic ectasia and atherosclerotic disease. No significant effusion. IMPRESSION: Bibasilar atelectasis/pneumonia. Electronically Signed   By: Franchot Gallo M.D.   On: 11/30/2017 19:41   Ct Chest Wo Contrast  Result Date: 12/02/2017 CLINICAL DATA:  Pneumonia. History of COPD, hypertension, chronic kidney disease and dementia. EXAM: CT CHEST WITHOUT CONTRAST TECHNIQUE: Multidetector CT imaging of the chest was performed following the standard protocol without IV contrast. COMPARISON:  Radiographs 11/30/2017 and 04/28/2017. Abdominal CT 04/26/2017, PET-CT 04/10/2008 and chest CT 03/23/2008. FINDINGS: Cardiovascular: There is extensive atherosclerosis of the aorta, great vessels and coronary arteries. The thoracic aorta is tortuous with dilatation of the aortic arch to 3.7 cm on sagittal image 86/7. No displaced intimal calcification or mediastinal hematoma identified. The heart size is normal. There is no pericardial effusion. Mediastinum/Nodes: There are no enlarged mediastinal, hilar or axillary lymph nodes.Small mediastinal lymph nodes are stable. There is mild heterogeneity of the thyroid gland without dominant nodule. Lungs/Pleura: There are small dependent pleural effusions with adjacent mild atelectasis bilaterally. There is mild emphysema with diffuse central airway thickening. Spiculated right upper lobe mass has enlarged from 2010, measuring up to 1.9 x 1.9 cm on image 49/5. There  are additional patchy ground-glass and airspace opacities in both lungs which are likely inflammatory. There is some associated architectural distortion and bronchiectasis. Upper abdomen: The visualized upper abdomen appears stable without acute findings. Musculoskeletal/Chest wall: There is no chest wall mass or suspicious osseous finding. IMPRESSION: 1. Spiculated right upper lobe nodule was present in 2010, although has approximately tripled in volume over this interval, suspicious for slow growing neoplasm, likely adenocarcinoma. 2. Patchy ground-glass and airspace opacities in both lungs are likely inflammatory. 3. Small bilateral pleural effusions. 4. No evidence of adenopathy. 5. Severe coronary and Aortic Atherosclerosis (ICD10-I70.0). Electronically Signed   By: Richardean Sale M.D.   On: 12/02/2017 15:23   US Renal  Result Date: 12/03/2017 CLINICAL DATA:  Creatinine elevation EXAM: RENAL / URINARY TRACT ULTRASOUND COMPLETE COMPARISON:  CT 04/26/2017 FINDINGS: Right Kidney: Renal measurements: 8.9 x 3.7 x 4.0 cm = volume: 68 mL. Increased echotexture throughout the right kidney. No hydronephrosis. 2 small cysts, the largest 10 mm. These appear simple. Left Kidney: Renal measurements: 8.9 x 3.0 x 4.1 cm = volume: 57 mL. Increased echotexture. No mass or hydronephrosis. Bladder: Not visualized, decompressed. Incidentally noted is a small left pleural effusion. IMPRESSION: Atrophic, echogenic kidneys compatible  with chronic medical renal disease. No acute findings. Electronically Signed   By: Rolm Baptise M.D.   On: 12/03/2017 11:58   Dg Swallowing Func-speech Pathology  Result Date: 12/03/2017 Objective Swallowing Evaluation: Type of Study: MBS-Modified Barium Swallow Study  Patient Details Name: Annette Cantrell MRN: [id number] Date of Birth: 21-Jun-1922 Today's Date: 12/03/2017 Time: SLP Start Time (ACUTE ONLY): 1444 -SLP Stop Time (ACUTE ONLY): 1456 SLP Time Calculation (min) (ACUTE ONLY): 12 min  Past Medical History: Past Medical History: Diagnosis Date . CAD (coronary artery disease)  . Chronic systolic heart failure ([place])  . COPD 03/30/2008  Qualifier: Diagnosis of  By: [name] MD, [name]  . Dementia ([place])  . Heart disease, unspecified  . Herpes zoster 01/30/2012 . High cholesterol  . History of positive PPD, untreated  . Infected sebaceous cyst 01/25/2012 . LUNG NODULE 03/30/2008  Qualifier: Diagnosis of  By: [name] MD, [name]  . Stroke [facility])  Past Surgical History: Past Surgical History: Procedure Laterality Date . HIP ARTHROPLASTY  01/31/2012  Procedure: ARTHROPLASTY BIPOLAR HIP;  Surgeon: Rozanna Box, MD;  Location: [place];  Service: Orthopedics;  Laterality: Right; HPI: 84 yo female with h/o dementia adm to [facility] with difficulty swallowing, coughing.  Pt with reported decline in function and weight loss.  She is a DNR but is not comfort care at this time.  Pt has been lethargic since admit but today is alert.   Subjective: pt awake in bed Assessment / Plan / Recommendation CHL IP CLINICAL IMPRESSIONS 12/03/2017 Clinical Impression Pt presents with mild oropharyngeal dysphagia with NO aspiration or penetration of all boluses given.  Boluses were limited in number due to pt agitation and swearing.   Delayed pharyngeal swallow to pyriform sinus noted across all consistencies.  Oral residuals prematurely spilled to pyriform with delayed swallow but adequate clearance.  Pharyngeal swallow is strong without residuals.  Of note, pt did NOT cough during evaluation therefore difficult to rule out some aspiration during po *suspect would be due to oral residuals.  SLP will follow up to educate family to findings/aspiration precautions.   SLP Visit Diagnosis Dysphagia, oropharyngeal phase (R13.12) Attention and concentration deficit following -- Frontal lobe and executive function deficit following -- Impact on safety and function Severe aspiration risk   CHL IP TREATMENT RECOMMENDATION 12/03/2017 Treatment  Recommendations Therapy as outlined in treatment plan below   Prognosis 12/03/2017 Prognosis for Safe Diet Advancement Fair Barriers to Reach Goals Cognitive deficits Barriers/Prognosis Comment -- CHL IP DIET RECOMMENDATION 12/03/2017 SLP Diet Recommendations Dysphagia 3 (Mech soft) solids;Thin liquid Liquid Administration via Cup;Straw Medication Administration Crushed with puree Compensations Slow rate;Small sips/bites Postural Changes --   CHL IP OTHER RECOMMENDATIONS 12/03/2017 Recommended Consults -- Oral Care Recommendations Oral care QID Other Recommendations --   No flowsheet data found.  CHL IP FREQUENCY AND DURATION 12/03/2017 Speech Therapy Frequency (ACUTE ONLY) min 1 x/week Treatment Duration 1 week      CHL IP ORAL PHASE 12/03/2017 Oral Phase Impaired Oral - Pudding Teaspoon -- Oral - Pudding Cup -- Oral - Honey Teaspoon -- Oral - Honey Cup -- Oral - Nectar Teaspoon -- Oral - Nectar Cup Premature spillage;Piecemeal swallowing Oral - Nectar Straw -- Oral - Thin Teaspoon -- Oral - Thin Cup Premature spillage;Piecemeal swallowing Oral - Thin Straw Premature spillage;Piecemeal swallowing Oral - Puree Premature spillage;Piecemeal swallowing Oral - Mech Soft -- Oral - Regular Premature spillage;Piecemeal swallowing Oral - Multi-Consistency -- Oral - Pill -- Oral Phase - Comment --  CHL IP PHARYNGEAL PHASE 12/03/2017 Pharyngeal Phase Impaired Pharyngeal- Pudding Teaspoon -- Pharyngeal -- Pharyngeal- Pudding Cup -- Pharyngeal -- Pharyngeal- Honey Teaspoon -- Pharyngeal -- Pharyngeal- Honey Cup -- Pharyngeal -- Pharyngeal- Nectar Teaspoon -- Pharyngeal -- Pharyngeal- Nectar Cup Delayed swallow initiation-pyriform sinuses Pharyngeal -- Pharyngeal- Nectar Straw -- Pharyngeal -- Pharyngeal- Thin Teaspoon -- Pharyngeal -- Pharyngeal- Thin Cup Delayed swallow initiation-pyriform sinuses Pharyngeal -- Pharyngeal- Thin Straw Delayed swallow initiation-pyriform sinuses Pharyngeal -- Pharyngeal- Puree Delayed swallow  initiation-pyriform sinuses Pharyngeal -- Pharyngeal- Mechanical Soft -- Pharyngeal -- Pharyngeal- Regular Delayed swallow initiation-pyriform sinuses Pharyngeal -- Pharyngeal- Multi-consistency -- Pharyngeal -- Pharyngeal- Pill -- Pharyngeal -- Pharyngeal Comment delayed swallow, multiple swallows assist to clear oral residuals that premature spilled into pharynx  CHL IP CERVICAL ESOPHAGEAL PHASE 12/03/2017 Cervical Esophageal Phase (No Data) Pudding Teaspoon -- Pudding Cup -- Honey Teaspoon -- Honey Cup -- Nectar Teaspoon -- Nectar Cup -- Nectar Straw -- Thin Teaspoon -- Thin Cup -- Thin Straw -- Puree -- Mechanical Soft -- Regular -- Multi-consistency -- Pill -- Cervical Esophageal Comment -- [name] 12/03/2017, 3:16 PM  [name], MS [facility] SLP Acute Rehab Services Pager [phone] Office [phone]              Microbiology: Recent Results (from the past 240 hour(s))  Blood culture (routine x 2)     Status: None (Preliminary result)   Collection Time: 11/30/17  9:07 PM  Result Value Ref Range Status   Specimen Description BLOOD RIGHT ANTECUBITAL  Final   Special Requests   Final    BOTTLES DRAWN AEROBIC AND ANAEROBIC Blood Culture adequate volume   Culture   Final    NO GROWTH 3 DAYS Performed at [facility], [address], [place], [place] [address]    Report Status PENDING  Incomplete  Blood culture (routine x 2)     Status: None (Preliminary result)   Collection Time: 12/01/17 12:24 AM  Result Value Ref Range Status   Specimen Description   Final    BLOOD LEFT HAND Performed at [facility], [place] [address] [place], [place] [address]    Special Requests   Final    AEROBIC BOTTLE ONLY Blood Culture adequate volume Performed at [place] [address], [place], [place] [address]    Culture   Final    NO GROWTH 3 DAYS Performed at [facility], [place] [address], [place], [place] [address]    Report Status PENDING   Incomplete  MRSA PCR Screening     Status: Abnormal   Collection Time: 12/01/17  2:21 AM  Result Value Ref Range Status   MRSA by PCR POSITIVE (A) NEGATIVE Final    Comment:        The GeneXpert MRSA Assay (FDA approved for NASAL specimens only), is one component of a comprehensive MRSA colonization surveillance program. It is not intended to diagnose MRSA infection nor to guide or monitor treatment for MRSA infections. RESULT CALLED TO, READ BACK BY AND VERIFIED WITH: Marchelle Gearing RN AT 4166 12/01/17 BY TIBBITTS,K Performed at [place] [address] [place],  [address]      Labs: Basic Metabolic Panel: Recent Labs  Lab 12/01/17 0215 12/02/17 0546 12/03/17 0551 12/03/17 1312 12/04/17 0743  NA 147* 148* 149* 147* 144  K 4.4 4.7 4.8 4.4 4.2  CL 114* 123* 120* 118* 115*  CO2 20* 18* 18* 21* 20*  GLUCOSE 149* 64* 82  95 95  BUN 80* 73* 70* 66* 51*  CREATININE 2.59* 2.27* 2.21* 2.09* 1.82*  CALCIUM 9.2 8.5* 8.8* 8.8* 8.8*   Liver Function Tests: No results for input(s): AST, ALT, ALKPHOS, BILITOT, PROT, ALBUMIN in the last 168 hours. No results for input(s): LIPASE, AMYLASE in the last 168 hours. No results for input(s): AMMONIA in the last 168 hours. CBC: Recent Labs  Lab 11/30/17 1847 12/01/17 0215 12/02/17 0546 12/03/17 0551 12/04/17 0743  WBC 15.2* 13.5* 10.3 8.4 5.9  NEUTROABS  --   --  8.4* 6.3 3.4  HGB 8.2* 8.2* 7.0* 7.0* 7.2*  HCT 27.8* 28.7* 24.5* 24.4* 24.4*  MCV 110.8* 113.0* 116.1* 114.6* 113.5*  PLT 173 205 188 232 228   Cardiac Enzymes: Recent Labs  Lab 11/30/17 1847  TROPONINI <0.03   BNP: BNP (last 3 results) Recent Labs    04/26/17 2052 11/30/17 1847  BNP 711.3* 241.4*    ProBNP (last 3 results) No results for input(s): PROBNP in the last 8760 hours.  CBG: No results for input(s): GLUCAP in the last 168 hours.     Signed:  Florencia Reasons MD, PhD  [facility] 12/05/2017, 9:03 AM

## 2017-12-04 NOTE — Progress Notes (Signed)
  Name: Annette Cantrell, Annette Cantrell  Location: [place] of visit: prayer Summary: Chaplain visited with Pt. at the family request. Pt will be going to [facility]. Chaplain shared time with family and Pt. also offered and provided prayer. Pt said her own prayer during visit. Family expressed apperception. Chaplain will try to follow up with Pt if she is still here tomorrow.

## 2017-12-04 NOTE — Progress Notes (Signed)
[facility]osp[facility] Palliative [facility] of [facility])  Received request from [place] and Dr. Rowe Pavy for referral to [facility].  Patient and chart under review by [facility] physician at this time.  Contacted [name] (granddaughter and NOK) to confirm interest and explain services.  She did not have any questions.  CSW and [name] are aware that [facility] will update when bed availability has been confirmed.  Thank you for this referral, Venia Carbon BSN, RN  [facility] Liaison (listed in [place]) [phone]

## 2017-12-04 NOTE — Progress Notes (Signed)
[facility]osp[facility] Palliative [facility] of [facility]) @ 1400  Eligibility has been confirmed for residential hospice at [facility].  Met with [name] (granddaughter), explained services and completed necessary paperwork.  [name] did not have any questions.  Advised her that transport to [facility] will occur on 12/1 near 10am, depending on transport availability. CSW is aware of above.    Dr. Orpah Melter to assume care per family request.    Please fax discharge summary to [phone] when complete.  RN staff:  please call report tomorrow (12/1) to [phone].  Please call with any hospice related questions or concerns.  Venia Carbon BSN, RN [facility] Liaison (listed in [place]

## 2017-12-04 NOTE — Consult Note (Signed)
Consultation Note Date: 12/04/2017   Patient Name: Annette Cantrell  DOB: 1922-03-17  MRN: [id number]  Age / Sex: 84 y.o., female  PCP: Sande Brothers, MD Referring Physician: Florencia Reasons, MD  Reason for Consultation: Establishing goals of care  HPI/Patient Profile: 84 y.o. female    admitted on 11/30/2017    Clinical Assessment and Goals of Care:  84 yo lady with advanced dementia, admitted with sepsis, acute hypoxic resp failure, possible aspiration pneumonia. Patient has AKI on top of stage IV CKD. She has underlying CVA, vascular dementia. She has possible metabolic encephalopathy. Patient was noted to have ongoing decline, even since this hospitalization. The patient doesn't verbalize.   The patient is resting in bed, she has barely eaten a few bites. She becomes restless and agitated when touched. She does not follow commands.   There was no family in the room. After discussing with bedside RN and bedside tech, call placed and discussed with grand daughter Annette Cantrell. I introduced myself and palliative care as follows: Palliative medicine is specialized medical care for people living with serious illness. It focuses on providing relief from the symptoms and stress of a serious illness. The goal is to improve quality of life for both the patient and the family.  I discussed with Ms Annette Cantrell about the patient's current condition and her underlying illnesses. We reviewed about comfort care and hospice philosophy of care.   The patient's adult children are all deceased, and grand daughter Annette Cantrell is the only next of kin who has been directly involved in her decision making, as per my discussions with Annette Cantrell over the phone.   Annette Cantrell was out of town for the holiday, but is on her way back home. We discussed about residential hospice and comfort care, she completely agrees. Patient's daughter Annette Cantrell mom, died  at [employer] a few years ago.   The patient has had gradual progressive decline. We discussed about her underlying dementia, her ongoing confusion, her generalized weakness, lack of meaningful PO intake, her likelihood of lung nodule size increase likely adenocarcinoma.   See additional discussions/recommendations as listed below. Thank you for the consult.    NEXT OF KIN  grand daughter Annette Cantrell is next of kin.  Patient's 2 daughters are deceased. One of them was Ms Annette Cantrell, who was Annette Cantrell's mother. Ms Annette Cantrell died in [facility].  One other grand daughter is "locked up" according to grand daughter Annette Cantrell.   SUMMARY OF RECOMMENDATIONS    DNR DNI Comfort measures Comfort feeds CSW consult for residential hospice, grand daughter Annette Cantrell is familiar with [employer].  No further work up of possible lung cancer Prognosis likely less than 2 weeks.   Code Status/Advance Care Planning:  DNR    Symptom Management:    as above   Palliative Prophylaxis:   Delirium Protocol  Additional Recommendations (Limitations, Scope, Preferences):  Full Comfort Care  Psycho-social/Spiritual:   Desire for further Chaplaincy support:yes  Additional Recommendations: Education on Hospice  Prognosis:   < 2 weeks  Discharge Planning: Hospice facility      Primary Diagnoses: Present on Admission: . Acute respiratory failure with hypoxia ([place]) . Sepsis ([place]) . Dementia ([place]) . HTN (hypertension)   I have reviewed the medical record, interviewed the patient and family, and examined the patient. The following aspects are pertinent.  Past Medical History:  Diagnosis Date  . CAD (coronary artery disease)   . Chronic systolic heart failure ([name])   . COPD 03/30/2008   Qualifier: Diagnosis of  By: [name] MD, [name]   . Dementia ([name])   . Heart disease, unspecified   . Herpes zoster 01/30/2012  . High cholesterol   . History of positive PPD, untreated   . Infected  sebaceous cyst 01/25/2012  . LUNG NODULE 03/30/2008   Qualifier: Diagnosis of  By: [name] MD, [name]   . Stroke [facility])    Social History   Socioeconomic History  . Marital status: Widowed    Spouse name: Not on file  . Number of children: Not on file  . Years of education: Not on file  . Highest education level: Not on file  Occupational History  . Not on file  Social Needs  . Financial resource strain: Not on file  . Food insecurity:    Worry: Not on file    Inability: Not on file  . Transportation needs:    Medical: Not on file    Non-medical: Not on file  Tobacco Use  . Smoking status: Current Every Day Smoker    Packs/day: 0.20    Types: Cigarettes  . Smokeless tobacco: Never Used  Substance and Sexual Activity  . Alcohol use: No  . Drug use: No  . Sexual activity: Never  Lifestyle  . Physical activity:    Days per week: Not on file    Minutes per session: Not on file  . Stress: Not on file  Relationships  . Social connections:    Talks on phone: Not on file    Gets together: Not on file    Attends religious service: Not on file    Active member of club or organization: Not on file    Attends meetings of clubs or organizations: Not on file    Relationship status: Not on file  Other Topics Concern  . Not on file  Social History Narrative  . Not on file   Family History  Problem Relation Age of Onset  . Stroke Mother    Scheduled Meds: . Chlorhexidine Gluconate Cloth  6 each Topical Q0600  . heparin  5,000 Units Subcutaneous Q8H  . ipratropium-albuterol  3 mL Nebulization TID  . mouth rinse  15 mL Mouth Rinse BID  . mupirocin ointment  1 application Nasal BID   Continuous Infusions: . ampicillin-sulbactam (UNASYN) IV Stopped (12/03/17 1726)  . valproate sodium 125 mg (12/04/17 0329)   PRN Meds:.albuterol, hydrALAZINE, LORazepam Medications Prior to Admission:  Prior to Admission medications   Medication Sig Start Date End Date Taking? Authorizing  Provider  acetaminophen (TYLENOL) 325 MG tablet Take 650 mg by mouth every morning.   Yes [provider]  acetaminophen (TYLENOL) 500 MG tablet Take 500 mg by mouth every 4 (four) hours as needed for moderate pain.   Yes [provider]  albuterol (PROVENTIL) (2.5 MG/3ML) 0.083% nebulizer solution Inhale 3 mLs into the lungs every 4 (four) hours as needed for wheezing or shortness of breath. 01/14/16  Yes Mesner, Corene Cornea, MD  [place] hydroxide-simeth ([place]) [id number]  MG/5ML suspension Take 30 mLs by mouth every 6 (six) hours as needed for indigestion or heartburn.   Yes [provider]  amLODipine (NORVASC) 5 MG tablet Take 1 tablet (5 mg total) by mouth daily. 04/30/17  Yes Hosie Poisson, MD  azithromycin (ZITHROMAX) 250 MG tablet Take 250 mg by mouth daily. Take for 5 Days   Yes [provider]  divalproex (DEPAKOTE SPRINKLE) 125 MG capsule Take 125 mg by mouth 4 (four) times daily.    Yes [provider]  ferrous sulfate 325 (65 FE) MG tablet Take 325 mg by mouth daily with breakfast.   Yes [provider]  guaifenesin (ROBITUSSIN) 100 MG/5ML syrup Take 200 mg by mouth 4 (four) times daily as needed for cough.   Yes [provider]  hydrocortisone 2.5 % ointment Apply 1 application topically every 6 (six) hours as needed (pain).   Yes [provider]  ipratropium-albuterol (DUONEB) 0.5-2.5 (3) MG/3ML SOLN Take 3 mLs by nebulization every 6 (six) hours as needed (sob and cough).   Yes [provider]  lisinopril-hydrochlorothiazide (PRINZIDE,ZESTORETIC) 20-12.5 MG tablet Take 1 tablet by mouth daily.   Yes [provider]  loperamide (IMODIUM) 2 MG capsule Take 2 mg by mouth daily as needed for diarrhea or loose stools.   Yes [provider]  LORazepam (ATIVAN) 0.5 MG tablet Take 0.5 mg by mouth every 4 (four) hours as needed for anxiety.   Yes [provider]  magnesium hydroxide (MILK OF  MAGNESIA) 400 MG/5ML suspension Take 30 mLs by mouth at bedtime as needed for mild constipation or moderate constipation.   Yes [provider]  mirtazapine (REMERON) 7.5 MG tablet Take 7.5 mg by mouth at bedtime.   Yes [provider]  neomycin-bacitracin-polymyxin (NEOSPORIN) ointment Apply 1 application topically daily as needed for wound care.   Yes [provider]  Nutritional Supplements (NUTRITIONAL SUPPLEMENT PO) Take 237 mLs by mouth 3 (three) times daily with meals. [place]    Yes [provider]  OLANZapine zydis (ZYPREXA) 5 MG disintegrating tablet Take 1 tablet (5 mg total) by mouth at bedtime. 04/05/13  Yes Velvet Bathe, MD  risperiDONE (RISPERDAL) 0.5 MG tablet Take 0.5 mg by mouth daily.   Yes [provider]  sertraline (ZOLOFT) 50 MG tablet Take 50 mg by mouth daily.   Yes [provider]   No Known Allergies Review of Systems No verbal  Physical Exam Frail elderly lady Resting in bed Does not follow commands Does not open eyes She has shallow regular breath sounds S 1 S 2  Abdomen is not distended No edema She has intermittent agitation  Vital Signs: BP (!) 158/81 (BP Location: Right Arm)   Pulse 82   Temp 97.9 F (36.6 C) (Oral)   Resp (!) 24   Ht 5\' 1"  (1.549 m)   Wt 46.6 kg   SpO2 98%   BMI 19.41 kg/m  Pain Scale: 0-10   Pain Score: Asleep   SpO2: SpO2: 98 % O2 Device:SpO2: 98 % O2 Flow Rate: .O2 Flow Rate (L/min): 2 L/min  IO: Intake/output summary:   Intake/Output Summary (Last 24 hours) at 12/04/2017 0915 Last data filed at 12/04/2017 0600 Gross per 24 hour  Intake 1856.18 ml  Output 500 ml  Net 1356.18 ml    LBM: Last BM Date: 12/01/17 Baseline Weight: Weight: 44 kg Most recent weight: Weight: 46.6 kg     Palliative Assessment/Data:    PPS 10% Time  In:  8 Time Out:9.10 Time Total:  70 min  Greater than 50%  of this time was spent counseling and coordinating care related to  the above assessment and plan.  Signed by: Loistine Chance, MD  [phone] Please contact Palliative Medicine Team phone at [phone] for questions and concerns.  For individual provider: See Shea Evans

## 2017-12-04 NOTE — Progress Notes (Signed)
PROGRESS NOTE  Annette Cantrell IWP:[id number] DOB: 02-28-1922 DOA: 11/30/2017 PCP: Sande Brothers, MD  HPI/Recap of past 24 hours:  Does open her eyes Does not follow commands has intermittent agitation No fever, I did not hear cough today during encounter   Assessment/Plan: Principal Problem:   Sepsis ([place]) Active Problems:   Dementia ([place])   Acute respiratory failure with hypoxia ([place])   COPD with acute exacerbation ([place])   HTN (hypertension)   Chronic anemia   Recurrent aspiration pneumonia ([place])   Pneumonia of both lower lobes due to infectious organism ([place])   AKI (acute kidney injury) ([place])   CKD (chronic kidney disease) stage 4, GFR 15-29 ml/min (HCC)   Hypernatremia   Seizure disorder ([place])   Physical deconditioning   Pressure injury of skin   Palliative care by specialist   Goals of care, counseling/discussion  Sepsis and acute hypoxic respiratory failure /pneumonia possible aspiration pneumonia Lactic acid normalized, tachycardia improved D/c vanc /cefepime, change to unasyn , wbc normalized  ct chest; patchy groundglass and airspace opacities in both lungs  Spiculated right upper lobe nodule was present in 2010, although has approximately tripled in volume over this interval, suspicious for slow growing neoplasm, likely adenocarcinoma.  -Will defer work-up in this elderly demented patient  AKI on CKD IV -likely prerenal -renal US "Atrophic, echogenic kidneys compatible with chronic medical renal disease. No acute findings." -renal dosing meds, hold home med lisinopril HCTZ -Cr improving  Hypernatremia:  Likely from poor oral intake Sodium remains elevated despite D5 half saline  Change IV fluids to D5 only Sodium normalized   Macrocytic anemia mcv 116, hgb 7 B12/folate unremarkable /TSH suppressed retic inappropriately low No overt bleeding,  fecal occult blood + Transfuse as needed to keep hemoglobin greater than 7  HTN:  Home meds Norvasc  /lisinopril HCTZ on hold due to not taking oral meds On iv hydralazine prn  Chronic right bundle branch block on EKG  Chronic combined CHF Last EF 45% in 2015 Currently dry  Close monitor volume status  H/o CVA/vascular Dementia/agitation/possible acute metabolic encephalopathy -not taking oral meds -on iv depacon (h/o seizure?), add prn ativan iv (on oral ativan prn at home)  Poor prognosis, I have not seem improvement since admission,  palliative care consult for goals of care, transition to comfort measures and residential hospital placement  Code Status: DNR  Family Communication: patient   Disposition Plan: residential hospice   Consultants:  Palliative care  hospice  Procedures:  Modified barium swallow  Antibiotics:  As above   Objective: BP (!) 158/81 (BP Location: Right Arm)   Pulse 82   Temp 97.9 F (36.6 C) (Oral)   Resp (!) 24   Ht 5\' 1"  (1.549 m)   Wt 46.6 kg   SpO2 94%   BMI 19.41 kg/m   Intake/Output Summary (Last 24 hours) at 12/04/2017 1516 Last data filed at 12/04/2017 1204 Gross per 24 hour  Intake 1860 ml  Output 500 ml  Net 1360 ml   Filed Weights   11/30/17 2145 11/30/17 2306 12/01/17 0749  Weight: 44 kg 44.7 kg 46.6 kg    Exam: Patient is examined daily including today on 12/04/2017, exams remain the same as of yesterday except that has changed    General:  Confused, not following commands  Cardiovascular: RRR  Respiratory: less  rhonchi,  No wheezing  Abdomen: Soft/ND/NT, positive BS  Musculoskeletal: No Edema  Neuro: alert, confused, intermittent agitation  Data Reviewed: Basic Metabolic Panel:  Recent Labs  Lab 12/01/17 0215 12/02/17 0546 12/03/17 0551 12/03/17 1312 12/04/17 0743  NA 147* 148* 149* 147* 144  K 4.4 4.7 4.8 4.4 4.2  CL 114* 123* 120* 118* 115*  CO2 20* 18* 18* 21* 20*  GLUCOSE 149* 64* 82 95 95  BUN 80* 73* 70* 66* 51*  CREATININE 2.59* 2.27* 2.21* 2.09* 1.82*  CALCIUM 9.2 8.5* 8.8*  8.8* 8.8*   Liver Function Tests: No results for input(s): AST, ALT, ALKPHOS, BILITOT, PROT, ALBUMIN in the last 168 hours. No results for input(s): LIPASE, AMYLASE in the last 168 hours. No results for input(s): AMMONIA in the last 168 hours. CBC: Recent Labs  Lab 11/30/17 1847 12/01/17 0215 12/02/17 0546 12/03/17 0551 12/04/17 0743  WBC 15.2* 13.5* 10.3 8.4 5.9  NEUTROABS  --   --  8.4* 6.3 3.4  HGB 8.2* 8.2* 7.0* 7.0* 7.2*  HCT 27.8* 28.7* 24.5* 24.4* 24.4*  MCV 110.8* 113.0* 116.1* 114.6* 113.5*  PLT 173 205 188 232 228   Cardiac Enzymes:   Recent Labs  Lab 11/30/17 1847  TROPONINI <0.03   BNP (last 3 results) Recent Labs    04/26/17 2052 11/30/17 1847  BNP 711.3* 241.4*    ProBNP (last 3 results) No results for input(s): PROBNP in the last 8760 hours.  CBG: No results for input(s): GLUCAP in the last 168 hours.  Recent Results (from the past 240 hour(s))  Blood culture (routine x 2)     Status: None (Preliminary result)   Collection Time: 11/30/17  9:07 PM  Result Value Ref Range Status   Specimen Description BLOOD RIGHT ANTECUBITAL  Final   Special Requests   Final    BOTTLES DRAWN AEROBIC AND ANAEROBIC Blood Culture adequate volume   Culture   Final    NO GROWTH 3 DAYS Performed at [facility], [address] [place], [place] [address]    Report Status PENDING  Incomplete  Blood culture (routine x 2)     Status: None (Preliminary result)   Collection Time: 12/01/17 12:24 AM  Result Value Ref Range Status   Specimen Description   Final    BLOOD LEFT HAND Performed at [facility], [place] [address], [place], [place] [address]    Special Requests   Final    AEROBIC BOTTLE ONLY Blood Culture adequate volume Performed at [facility] [address] [place], [place] [address]    Culture   Final    NO GROWTH 3 DAYS Performed at [facility], [place] [address], [place], [place] [address]    Report Status PENDING   Incomplete  MRSA PCR Screening     Status: Abnormal   Collection Time: 12/01/17  2:21 AM  Result Value Ref Range Status   MRSA by PCR POSITIVE (A) NEGATIVE Final    Comment:        The GeneXpert MRSA Assay (FDA approved for NASAL specimens only), is one component of a comprehensive MRSA colonization surveillance program. It is not intended to diagnose MRSA infection nor to guide or monitor treatment for MRSA infections. RESULT CALLED TO, READ BACK BY AND VERIFIED WITH: Marchelle Gearing RN AT 5732 12/01/17 BY TIBBITTS,K Performed at [facility] [address], [place], [place] [address]      Studies: No results found.  Scheduled Meds: . Chlorhexidine Gluconate Cloth  6 each Topical Q0600  . heparin  5,000 Units Subcutaneous Q8H  . ipratropium-albuterol  3 mL  Nebulization TID  . mouth rinse  15 mL Mouth Rinse BID  . mupirocin ointment  1 application Nasal BID    Continuous Infusions: . ampicillin-sulbactam (UNASYN) IV 3 g (12/04/17 1515)  . valproate sodium 125 mg (12/04/17 1044)     Time spent: 64mins I have personally reviewed and interpreted on  12/04/2017 daily labs,  imagings as discussed above under date review session and assessment and plans.  I reviewed all nursing notes, pharmacy notes,   vitals, pertinent old records  I have discussed plan of care as described above with RN , patient  on 12/04/2017   Florencia Reasons MD, PhD  [facility] Pager [phone]. If 7PM-7AM, please contact night-coverage at [email or web address], password [email or web address] 12/04/2017, 3:16 PM  LOS: 4 days

## 2017-12-05 NOTE — Progress Notes (Signed)
Patient discharging to [facility]. CSW confirmed bed & faxed needed documents. [facility] will be called for transport.  RN call report: [phone].  CSW signing off.   Pricilla Holm, MSW, [place] [facility] [phone]

## 2017-12-05 NOTE — Progress Notes (Signed)
Pt D/C'd to [facility] via [place]. Granddaughter Santiago Glad called to be made aware.

## 2017-12-05 NOTE — Progress Notes (Signed)
Report called to RN Helene Kelp at [facility]. Per SW, [facility] has been called. Will change pt in to paper gown and remove PIV.

## 2017-12-06 LAB — CULTURE, BLOOD (ROUTINE X 2)
Culture: NO GROWTH
Culture: NO GROWTH
SPECIAL REQUESTS: ADEQUATE
SPECIAL REQUESTS: ADEQUATE

## 2018-01-05 DEATH — deceased

## 2020-04-02 IMAGING — CT CT HEAD W/O CM
3 series · 15 of 47 positions shown, 18 images · non-contrast
Comparison: 02/09/2017 CT.

CLINICAL DATA: [AGE] hypertensive female with vomiting.
History of dementia. Initial encounter.

EXAM:
CT HEAD WITHOUT CONTRAST
TECHNIQUE: Contiguous axial images were obtained from the base of the skull
through the vertex without intravenous contrast.

[Series 2: head wo · axial · 0.43mm/px · z∈[+1324,+1454]mm · 9 of 32 slices shown, 12 images]
[im 3/32  brain]
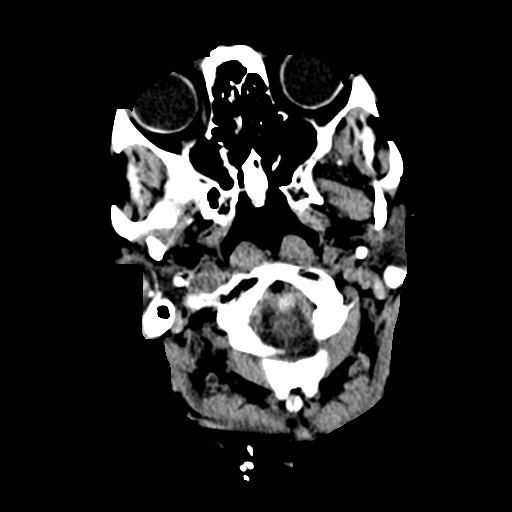
[im 3/32  bone]
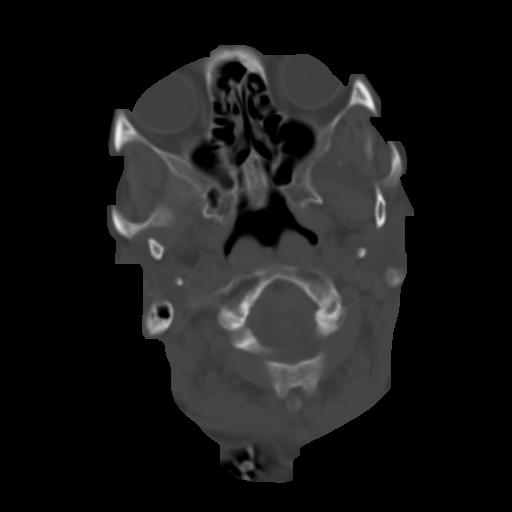
[im 6/32  brain]
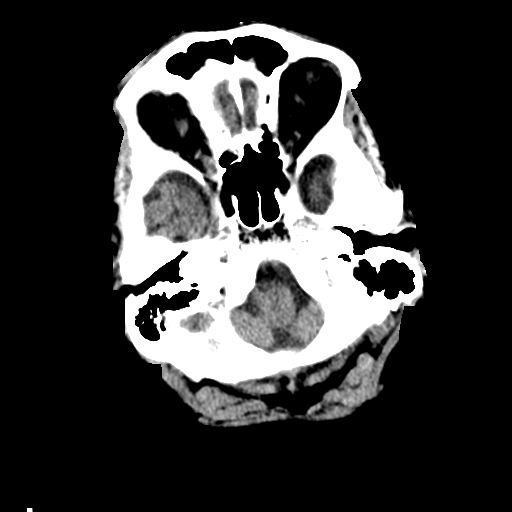
[im 9/32  brain]
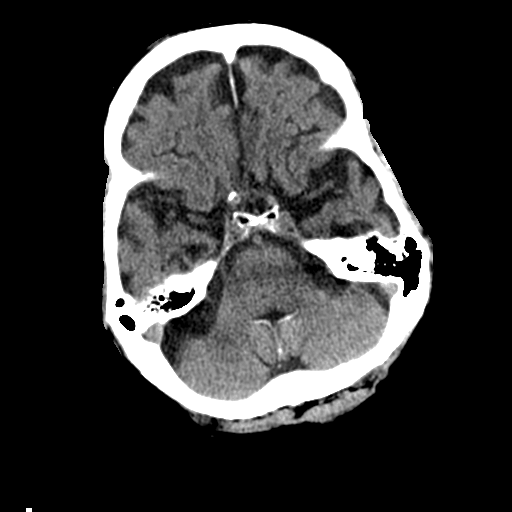
[im 12/32  brain]
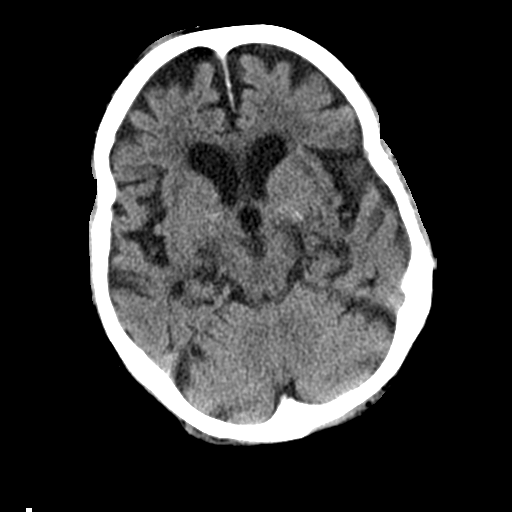
[im 17/32  brain]
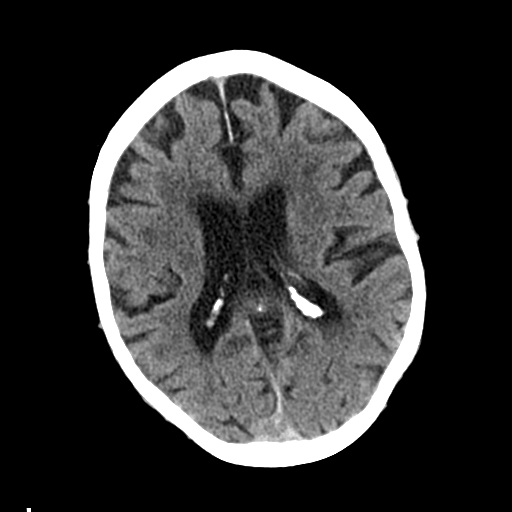
[im 17/32  bone]
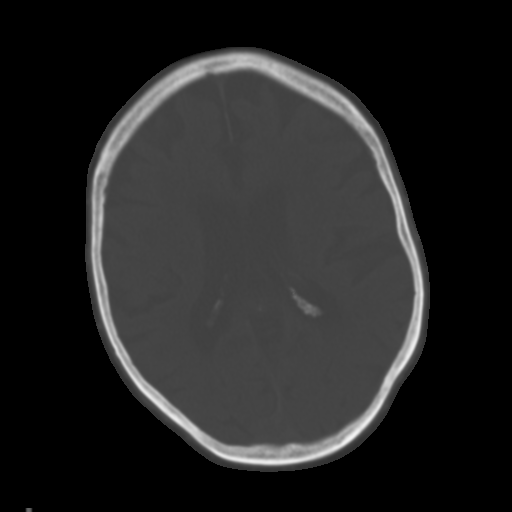
[im 20/32  brain]
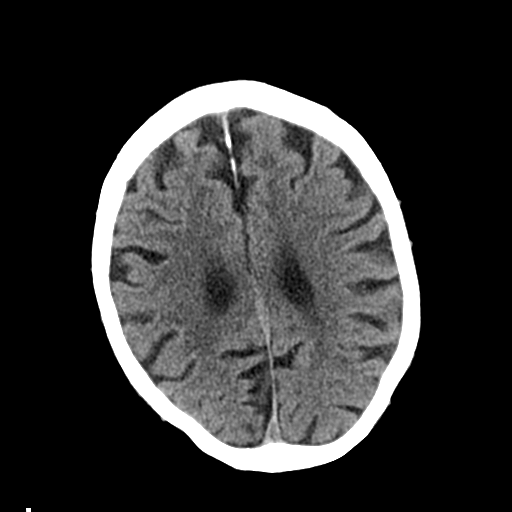
[im 23/32  brain]
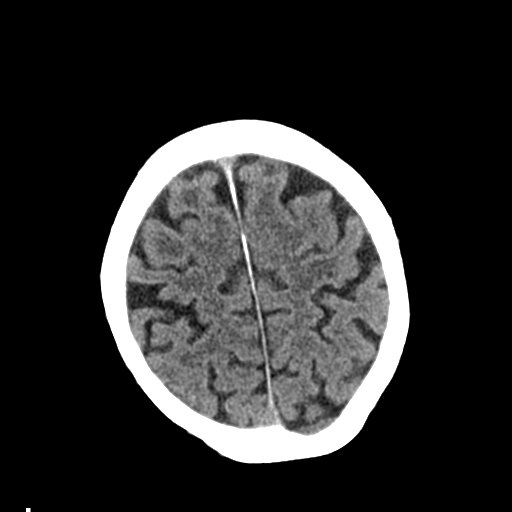
[im 26/32  brain]
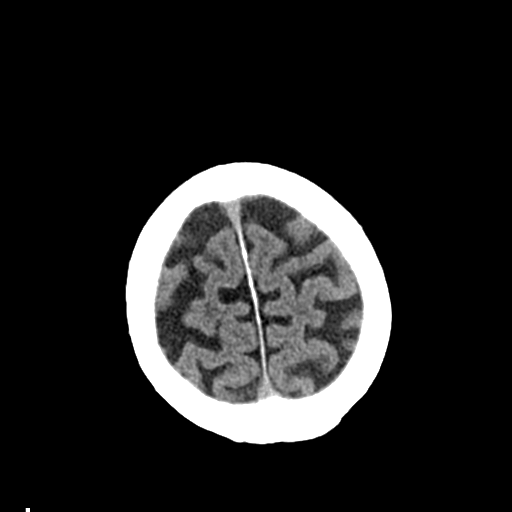
[im 29/32  brain]
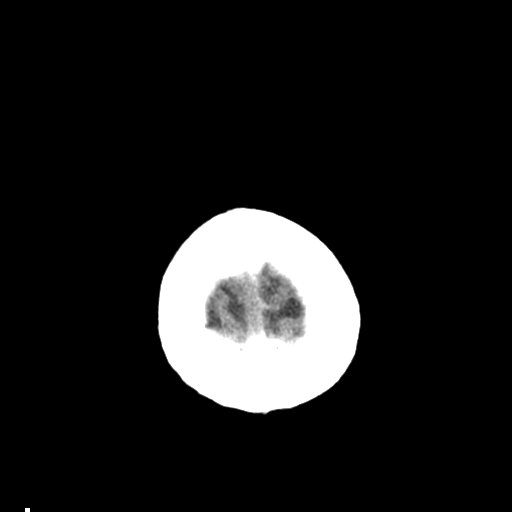
[im 29/32  bone]
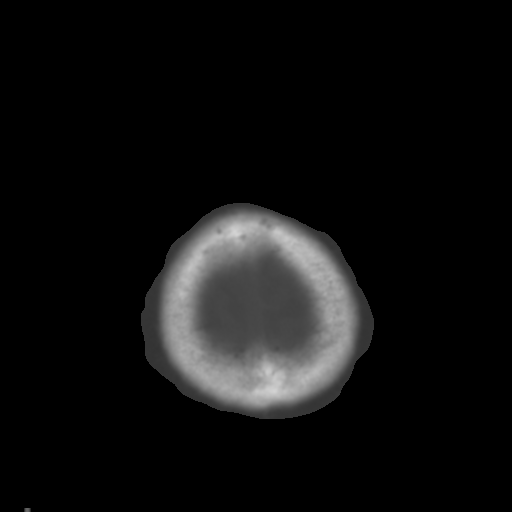

[Series 5: coronal soft tissue · coronal · 0.31mm/px · 3 of 71 slices shown]
[im 24/71  brain]
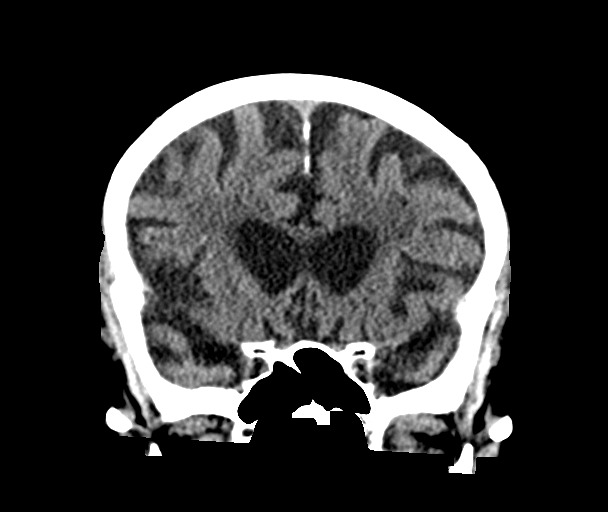
[im 32/71  brain]
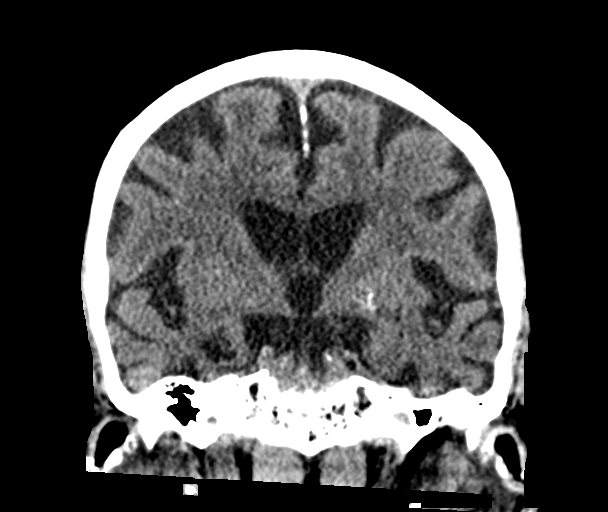
[im 39/71  brain]
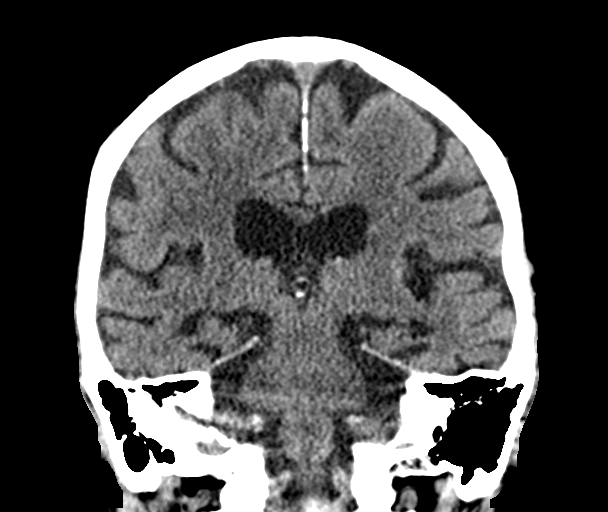

[Series 6: sagittal soft tissue · sagittal · 0.31mm/px · 3 of 64 slices shown]
[im 22/64  brain]
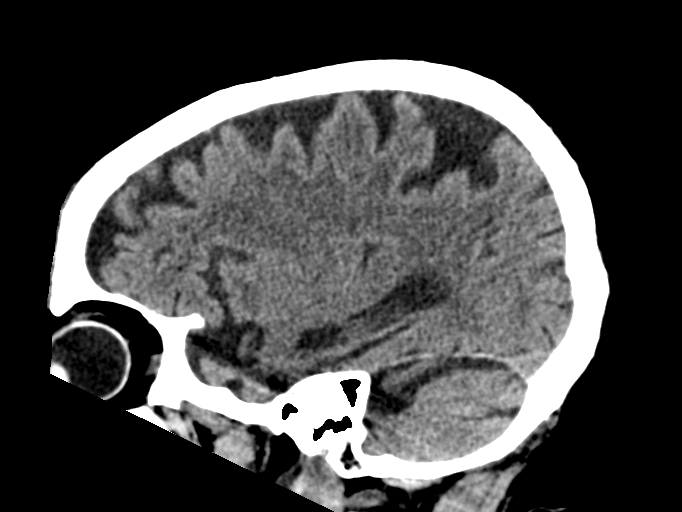
[im 32/64  brain]
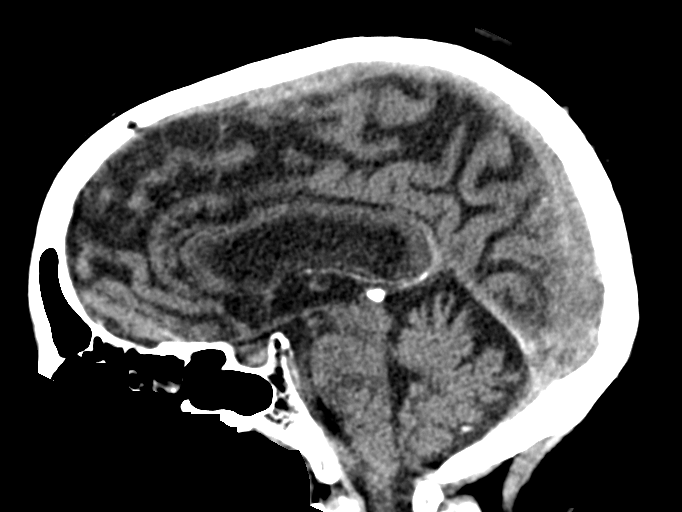
[im 43/64  brain]
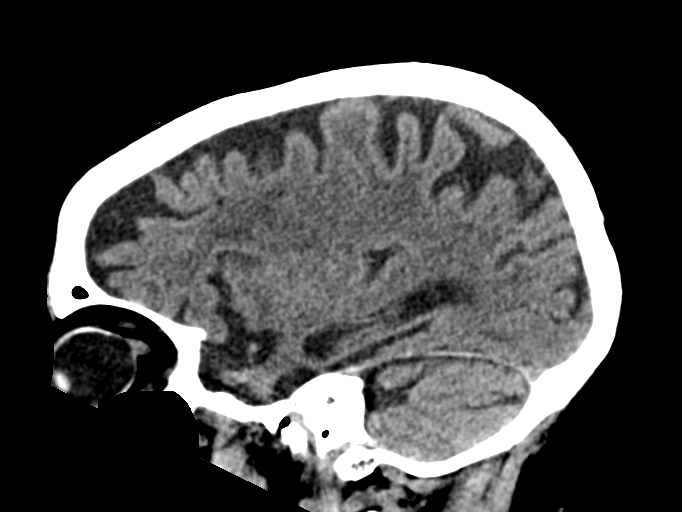

[15 of 47 positions shown; findings below may reference images not displayed]

FINDINGS: Brain: No intracranial hemorrhage or CT evidence of large acute
infarct.

Chronic microvascular changes.

Moderate global atrophy.

No intracranial mass lesion noted on this unenhanced exam.

Vascular: Vascular calcifications

Skull: No skull fracture

Sinuses/Orbits: No acute orbital abnormality. Visualized paranasal
sinuses, mastoid air cells and middle ear cavities are clear.

Other: Almost complete resolution of right frontal subcutaneous
hematoma.
IMPRESSION: No acute intracranial abnormality.

Global atrophy.

Chronic microvascular changes.
